# Patient Record
Sex: Male | Born: 1965 | Race: White | Hispanic: No | Marital: Married | Smoking: Former smoker
Health system: Southern US, Community
[De-identification: ages and names within clinical notes are randomized; demographics above are authoritative.]

## PROBLEM LIST (undated history)

## (undated) DIAGNOSIS — N189 Chronic kidney disease, unspecified: Secondary | ICD-10-CM

## (undated) DIAGNOSIS — I10 Essential (primary) hypertension: Secondary | ICD-10-CM

## (undated) DIAGNOSIS — C801 Malignant (primary) neoplasm, unspecified: Secondary | ICD-10-CM

## (undated) DIAGNOSIS — E119 Type 2 diabetes mellitus without complications: Secondary | ICD-10-CM

## (undated) DIAGNOSIS — L309 Dermatitis, unspecified: Secondary | ICD-10-CM

## (undated) HISTORY — DX: Dermatitis, unspecified: L30.9

## (undated) HISTORY — PX: SHOULDER SURGERY: SHX246

---

## 1996-09-07 HISTORY — PX: KNEE ARTHROSCOPY: SUR90

## 2001-06-13 ENCOUNTER — Other Ambulatory Visit: Admission: RE | Admit: 2001-06-13 | Discharge: 2001-06-13 | Payer: Self-pay | Admitting: Otolaryngology

## 2010-09-07 HISTORY — PX: COLONOSCOPY: SHX174

## 2010-09-25 ENCOUNTER — Ambulatory Visit
Admission: RE | Admit: 2010-09-25 | Discharge: 2010-09-25 | Payer: Self-pay | Source: Home / Self Care | Attending: Internal Medicine | Admitting: Internal Medicine

## 2010-11-05 ENCOUNTER — Encounter (HOSPITAL_BASED_OUTPATIENT_CLINIC_OR_DEPARTMENT_OTHER): Payer: 59 | Admitting: Internal Medicine

## 2010-11-05 ENCOUNTER — Ambulatory Visit (HOSPITAL_COMMUNITY)
Admission: RE | Admit: 2010-11-05 | Discharge: 2010-11-05 | Disposition: A | Discharge status: Home / Self Care | Payer: 59 | Source: Ambulatory Visit | Attending: Internal Medicine | Admitting: Internal Medicine

## 2010-11-05 ENCOUNTER — Other Ambulatory Visit (INDEPENDENT_AMBULATORY_CARE_PROVIDER_SITE_OTHER): Payer: Self-pay | Admitting: Internal Medicine

## 2010-11-05 DIAGNOSIS — I10 Essential (primary) hypertension: Secondary | ICD-10-CM | POA: Insufficient documentation

## 2010-11-05 DIAGNOSIS — R109 Unspecified abdominal pain: Secondary | ICD-10-CM

## 2010-11-05 DIAGNOSIS — R197 Diarrhea, unspecified: Secondary | ICD-10-CM

## 2010-11-05 DIAGNOSIS — Z79899 Other long term (current) drug therapy: Secondary | ICD-10-CM | POA: Insufficient documentation

## 2010-11-05 DIAGNOSIS — D126 Benign neoplasm of colon, unspecified: Secondary | ICD-10-CM | POA: Insufficient documentation

## 2010-11-05 DIAGNOSIS — Z7982 Long term (current) use of aspirin: Secondary | ICD-10-CM | POA: Insufficient documentation

## 2010-11-22 NOTE — Op Note (Signed)
  NAME:  [name], [name]                ACCOUNT NO.:  [id number]  MEDICAL RECORD NO.:  [id number]           PATIENT TYPE:  O  LOCATION:  [facility]YP                          FACILITY:  [facility]  PHYSICIAN:  Lionel December, M.D.    DATE OF BIRTH:  1966-05-04  DATE OF PROCEDURE:  11/05/2010 DATE OF DISCHARGE:                              OPERATIVE REPORT   PROCEDURE:  Colonoscopy with snare polypectomy.  INDICATION:  Douglas Robertson is a 45 year old Caucasian male who has been having chronic diarrhea and increased frequency of defecation.  Most of his stools are semi-formed and formed.  He has been treated antispasmodic without symptomatic relief.  He had stool studies, IBD panel, and celiac disease panel by Dr. Donzetta Sprung and all of these studies were negative.  His transaminases are mildly elevated, felt to be due to fatty liver.  He is undergoing diagnostic colonoscopy today.  Procedures were reviewed with the patient.  Informed consent was obtained. MEDS FOR CONSCIOUS SEDATION: 1. Demerol 50 mg IV. 2. Versed 6 mg IV.  FINDINGS:  Procedure performed in endoscopy suite.  The patient's vital signs and O2 sat were monitored during the procedure and remained stable.  The patient was placed in left lateral recumbent position. Rectal examination performed.  No abnormality noted on external or digital exam.  Pentax videoscope was placed through rectum and advanced under vision into sigmoid colon beyond.  Preparation was satisfactory. Scope was passed into cecum which was identified by appendiceal orifice and ileocecal valve.  Short segment of GI was also examined, it was normal.  As the scope was withdrawn, colonic mucosa was carefully examined.  There was a 6-7 mm sessile polyp at hepatic flexure which was snared and retrieved for sludge examination.  Rest of the mucosa was normal.  Random biopsies were taken from mucosa of sigmoid colon looking for microscopic colitis.  Rectal mucosa was normal.  Scope  was retroflexed to examine anorectal junction and small-to-moderate sized hemorrhoids were noted below the dentate line.  Endoscope was then withdrawn.  Withdrawal time was 11.5 minutes.  The patient tolerated the procedure well.  FINAL DIAGNOSES: 1. Normal terminal ileum. 2. No evidence of endoscopic colitis. 3. A 6-7 mm sessile polyp snared from hepatic flexure. 4. Random biopsies taken from mucosa of sigmoid colon, looking for     microscopic colitis.  RECOMMENDATIONS: 1. High-fiber diet. 2. I would like for him to increase his dicyclomine to 20 mg before     breakfast and 20 mg before lunch.  I will be contacting the patient     with results of biopsy and further recommendations.     Lionel December, M.D.     NR/MEDQ  D:  11/05/2010  T:  11/05/2010  Job:  [id number]  cc:   Donzetta Sprung Fax: [phone]  Electronically Signed by Lionel December M.D. on 11/22/2010 02:26:41 PM

## 2010-12-22 ENCOUNTER — Ambulatory Visit (INDEPENDENT_AMBULATORY_CARE_PROVIDER_SITE_OTHER): Payer: 59 | Admitting: Internal Medicine

## 2011-09-21 ENCOUNTER — Encounter (HOSPITAL_COMMUNITY): Payer: Self-pay | Admitting: *Deleted

## 2011-09-21 ENCOUNTER — Other Ambulatory Visit: Payer: Self-pay

## 2011-09-21 ENCOUNTER — Emergency Department (HOSPITAL_COMMUNITY)
Admission: EM | Admit: 2011-09-21 | Discharge: 2011-09-22 | Disposition: A | Discharge status: Home / Self Care | Payer: 59 | Attending: Emergency Medicine | Admitting: Emergency Medicine

## 2011-09-21 DIAGNOSIS — M25519 Pain in unspecified shoulder: Secondary | ICD-10-CM | POA: Insufficient documentation

## 2011-09-21 DIAGNOSIS — R6884 Jaw pain: Secondary | ICD-10-CM | POA: Insufficient documentation

## 2011-09-21 DIAGNOSIS — Z79899 Other long term (current) drug therapy: Secondary | ICD-10-CM | POA: Insufficient documentation

## 2011-09-21 DIAGNOSIS — I10 Essential (primary) hypertension: Secondary | ICD-10-CM | POA: Insufficient documentation

## 2011-09-21 DIAGNOSIS — Z7982 Long term (current) use of aspirin: Secondary | ICD-10-CM | POA: Insufficient documentation

## 2011-09-21 DIAGNOSIS — R079 Chest pain, unspecified: Secondary | ICD-10-CM | POA: Insufficient documentation

## 2011-09-21 HISTORY — DX: Essential (primary) hypertension: I10

## 2011-09-21 LAB — COMPREHENSIVE METABOLIC PANEL
ALT: 45 U/L (ref 0–53)
AST: 30 U/L (ref 0–37)
Albumin: 4 g/dL (ref 3.5–5.2)
Alkaline Phosphatase: 46 U/L (ref 39–117)
CO2: 26 mEq/L (ref 19–32)
Chloride: 99 mEq/L (ref 96–112)
Creatinine, Ser: 0.79 mg/dL (ref 0.50–1.35)
GFR calc non Af Amer: >90 mL/min (ref >90)
Potassium: 4 mEq/L (ref 3.5–5.1)
Total Bilirubin: 0.2 mg/dL — ABNORMAL LOW (ref 0.3–1.2)

## 2011-09-21 LAB — DIFFERENTIAL
Basophils Relative: 1 % (ref 0–1)
Eosinophils Absolute: 0.3 10*3/uL (ref 0.0–0.7)
Lymphs Abs: 2.1 10*3/uL (ref 0.7–4.0)
Monocytes Absolute: 0.7 10*3/uL (ref 0.1–1.0)
Monocytes Relative: 9 % (ref 3–12)
Neutro Abs: 4.7 10*3/uL (ref 1.7–7.7)

## 2011-09-21 LAB — CBC
HCT: 40.8 % (ref 39.0–52.0)
Hemoglobin: 14.3 g/dL (ref 13.0–17.0)
MCH: 28.3 pg (ref 26.0–34.0)
MCHC: 35 g/dL (ref 30.0–36.0)
RBC: 5.06 MIL/uL (ref 4.22–5.81)

## 2011-09-21 LAB — CK TOTAL AND CKMB (NOT AT ARMC): Relative Index: INVALID (ref 0.0–2.5)

## 2011-09-21 LAB — POCT I-STAT TROPONIN I

## 2011-09-21 NOTE — ED Notes (Deleted)
The pt says  He has had flu-like symptoms for several days.  He was here yesterday and was given a rx and he says the pharmacy would not fill because he has medicaid.  He is angry about the rx.

## 2011-09-21 NOTE — ED Notes (Signed)
The pt is c/o some lt sided chest pain and some lt shoulder radiation since Friday.  No sob no nausea

## 2011-09-22 MED ORDER — ASPIRIN 81 MG PO CHEW
324.0000 mg | CHEWABLE_TABLET | Freq: Once | ORAL | Status: AC
  Administered 2011-09-22: 324 mg via ORAL
  Filled 2011-09-22: qty 4

## 2011-09-22 MED ORDER — GI COCKTAIL ~~LOC~~
30.0000 mL | Freq: Once | ORAL | Status: AC
  Administered 2011-09-22: 30 mL via ORAL
  Filled 2011-09-22: qty 30

## 2011-09-22 NOTE — ED Notes (Signed)
Pt denies nausea, vomiting, SOB.  Family at bedside

## 2011-09-22 NOTE — ED Provider Notes (Signed)
History     CSN: [id number]  Arrival date & time 09/21/11  2224   First MD Initiated Contact with Patient 09/22/11 0048      Chief Complaint  Patient presents with  . Chest Pain    (Consider location/radiation/quality/duration/timing/severity/associated sxs/prior treatment) HPI Comments: States has been under a great deal of stress.  Has pcp  Patient is a 46 y.o. male presenting with chest pain. The history is provided by the patient. No language interpreter was used.  Chest Pain The chest pain began 3 - 5 days ago. Chest pain occurs constantly. The chest pain is unchanged. The pain is associated with stress. The severity of the pain is mild. The quality of the pain is described as aching. The pain radiates to the left jaw and left shoulder. Exacerbated by: nothing. Pertinent negatives for primary symptoms include no fever, no fatigue, no shortness of breath, no cough, no palpitations, no abdominal pain, no nausea, no vomiting and no dizziness.  Pertinent negatives for associated symptoms include no diaphoresis, no lower extremity edema, no near-syncope, no numbness and no weakness. He tried nothing for the symptoms. Risk factors include male gender and obesity.     Past Medical History  Diagnosis Date  . Hypertension     History reviewed. No pertinent past surgical history.  History reviewed. No pertinent family history.  History  Substance Use Topics  . Smoking status: Former Games developer  . Smokeless tobacco: Not on file  . Alcohol Use: No      Review of Systems  Constitutional: Negative for fever, diaphoresis, activity change, appetite change and fatigue.  HENT: Negative for congestion, sore throat, rhinorrhea, neck pain and neck stiffness.   Respiratory: Negative for cough and shortness of breath.   Cardiovascular: Positive for chest pain. Negative for palpitations and near-syncope.  Gastrointestinal: Negative for nausea, vomiting and abdominal pain.  Genitourinary:  Negative for dysuria, urgency, frequency and flank pain.  Neurological: Negative for dizziness, weakness, light-headedness, numbness and headaches.  All other systems reviewed and are negative.    Allergies  Review of patient's allergies indicates no known allergies.  Home Medications   Current Outpatient Rx  Name Route Sig Dispense Refill  . ASPIRIN EC 81 MG PO TBEC Oral Take 162 mg by mouth once. For chest pain    . NAPROXEN SODIUM 220 MG PO TABS Oral Take 440 mg by mouth once. For the chest pain    . OLMESARTAN MEDOXOMIL-HCTZ 40-12.5 MG PO TABS Oral Take 1 tablet by mouth daily.      BP 109/66  Pulse 60  Temp(Src) 97.1 F (36.2 C) (Oral)  Resp 16  SpO2 96%  Physical Exam  Nursing note and vitals reviewed. Constitutional: He is oriented to person, place, and time. He appears well-developed and well-nourished. No distress.  HENT:  Head: Normocephalic and atraumatic.  Mouth/Throat: Oropharynx is clear and moist.  Eyes: Conjunctivae and EOM are normal. Pupils are equal, round, and reactive to light.  Neck: Normal range of motion. Neck supple.  Cardiovascular: Normal rate, regular rhythm, normal heart sounds and intact distal pulses.  Exam reveals no gallop and no friction rub.   No murmur heard. Pulmonary/Chest: Effort normal and breath sounds normal. No respiratory distress. He exhibits no tenderness.  Abdominal: Soft. Bowel sounds are normal. There is no tenderness. There is no rebound and no guarding.  Musculoskeletal: Normal range of motion. He exhibits no tenderness.  Neurological: He is alert and oriented to person, place, and time. No cranial  nerve deficit.  Skin: Skin is warm and dry. No rash noted.    ED Course  Procedures (including critical care time)  Labs Reviewed  COMPREHENSIVE METABOLIC PANEL - Abnormal; Notable for the following:    Total Bilirubin 0.2 (*)    All other components within normal limits  CBC  DIFFERENTIAL  CK TOTAL AND CKMB  POCT I-STAT  TROPONIN I  TROPONIN I  I-STAT TROPONIN I   No results found.   1. Chest pain       MDM  Delta troponin is normal. Patient is pain-free at this time. I feel this is likely nonspecific and noncardiac chest pain. I feel this is likely stress related. I instructed him to followup with his primary care physician to discuss possibility of stress test. I have no concern for pulmonary embolus at this time. Additional life-threatening etiologies were considered but unlikely at this time. Provided signs and symptoms for which to return        Dayton Bailiff, MD 09/22/11 [phone]

## 2011-09-22 NOTE — ED Notes (Signed)
Pt presents with c/o CP onset 4 days ago waxing and waning. Currently states 2/10 left Cp to left shoulder

## 2011-10-08 DIAGNOSIS — R079 Chest pain, unspecified: Secondary | ICD-10-CM

## 2013-04-06 DIAGNOSIS — I10 Essential (primary) hypertension: Secondary | ICD-10-CM | POA: Insufficient documentation

## 2013-04-06 DIAGNOSIS — E782 Mixed hyperlipidemia: Secondary | ICD-10-CM | POA: Insufficient documentation

## 2013-04-11 ENCOUNTER — Ambulatory Visit (INDEPENDENT_AMBULATORY_CARE_PROVIDER_SITE_OTHER): Payer: 59 | Admitting: Orthopedic Surgery

## 2013-04-11 ENCOUNTER — Ambulatory Visit (INDEPENDENT_AMBULATORY_CARE_PROVIDER_SITE_OTHER): Payer: 59

## 2013-04-11 VITALS — BP 124/78 | Ht 68.0 in | Wt 239.0 lb

## 2013-04-11 DIAGNOSIS — M25512 Pain in left shoulder: Secondary | ICD-10-CM

## 2013-04-11 DIAGNOSIS — M25519 Pain in unspecified shoulder: Secondary | ICD-10-CM

## 2013-04-11 DIAGNOSIS — M719 Bursopathy, unspecified: Secondary | ICD-10-CM

## 2013-04-11 DIAGNOSIS — M67919 Unspecified disorder of synovium and tendon, unspecified shoulder: Secondary | ICD-10-CM

## 2013-04-11 NOTE — Patient Instructions (Addendum)
You have received a steroid shot. 15% of patients experience increased pain at the injection site with in the next 24 hours. This is best treated with ice and tylenol extra strength 2 tabs every 8 hours. If you are still having pain please call the office.   Start nsaids and and pain reliever at night  Impingement Syndrome, Rotator Cuff, Bursitis with Rehab Impingement syndrome is a condition that involves inflammation of the tendons of the rotator cuff and the subacromial bursa, that causes pain in the shoulder. The rotator cuff consists of four tendons and muscles that control much of the shoulder and upper arm function. The subacromial bursa is a fluid filled sac that helps reduce friction between the rotator cuff and one of the bones of the shoulder (acromion). Impingement syndrome is usually an overuse injury that causes swelling of the bursa (bursitis), swelling of the tendon (tendonitis), and/or a tear of the tendon (strain). Strains are classified into three categories. Grade 1 strains cause pain, but the tendon is not lengthened. Grade 2 strains include a lengthened ligament, due to the ligament being stretched or partially ruptured. With grade 2 strains there is still function, although the function may be decreased. Grade 3 strains include a complete tear of the tendon or muscle, and function is usually impaired. SYMPTOMS   Pain around the shoulder, often at the outer portion of the upper arm.  Pain that gets worse with shoulder function, especially when reaching overhead or lifting.  Sometimes, aching when not using the arm.  Pain that wakes you up at night.  Sometimes, tenderness, swelling, warmth, or redness over the affected area.  Loss of strength.  Limited motion of the shoulder, especially reaching behind the back (to the back pocket or to unhook bra) or across your body.  Crackling sound (crepitation) when moving the arm.  Biceps tendon pain and inflammation (in the front  of the shoulder). Worse when bending the elbow or lifting. CAUSES  Impingement syndrome is often an overuse injury, in which chronic (repetitive) motions cause the tendons or bursa to become inflamed. A strain occurs when a force is paced on the tendon or muscle that is greater than it can withstand. Common mechanisms of injury include: Stress from sudden increase in duration, frequency, or intensity of training.  Direct hit (trauma) to the shoulder.  Aging, erosion of the tendon with normal use.  Bony bump on shoulder (acromial spur). RISK INCREASES WITH:  Contact sports (football, wrestling, boxing).  Throwing sports (baseball, tennis, volleyball).  Weightlifting and bodybuilding.  Heavy labor.  Previous injury to the rotator cuff, including impingement.  Poor shoulder strength and flexibility.  Failure to warm up properly before activity.  Inadequate protective equipment.  Old age.  Bony bump on shoulder (acromial spur). PREVENTION   Warm up and stretch properly before activity.  Allow for adequate recovery between workouts.  Maintain physical fitness:  Strength, flexibility, and endurance.  Cardiovascular fitness.  Learn and use proper exercise technique. PROGNOSIS  If treated properly, impingement syndrome usually goes away within 6 weeks. Sometimes surgery is required.  RELATED COMPLICATIONS   Longer healing time if not properly treated, or if not given enough time to heal.  Recurring symptoms, that result in a chronic condition.  Shoulder stiffness, frozen shoulder, or loss of motion.  Rotator cuff tendon tear.  Recurring symptoms, especially if activity is resumed too soon, with overuse, with a direct blow, or when using poor technique. TREATMENT  Treatment first involves the use  of ice and medicine, to reduce pain and inflammation. The use of strengthening and stretching exercises may help reduce pain with activity. These exercises may be performed  at home or with a therapist. If non-surgical treatment is unsuccessful after more than 6 months, surgery may be advised. After surgery and rehabilitation, activity is usually possible in 3 months.  MEDICATION  If pain medicine is needed, nonsteroidal anti-inflammatory medicines (aspirin and ibuprofen), or other minor pain relievers (acetaminophen), are often advised.  Do not take pain medicine for 7 days before surgery.  Prescription pain relievers may be given, if your caregiver thinks they are needed. Use only as directed and only as much as you need.  Corticosteroid injections may be given by your caregiver. These injections should be reserved for the most serious cases, because they may only be given a certain number of times. HEAT AND COLD  Cold treatment (icing) should be applied for 10 to 15 minutes every 2 to 3 hours for inflammation and pain, and immediately after activity that aggravates your symptoms. Use ice packs or an ice massage.  Heat treatment may be used before performing stretching and strengthening activities prescribed by your caregiver, physical therapist, or athletic trainer. Use a heat pack or a warm water soak. SEEK MEDICAL CARE IF:   Symptoms get worse or do not improve in 4 to 6 weeks, despite treatment.  New, unexplained symptoms develop. (Drugs used in treatment may produce side effects.)

## 2013-04-12 ENCOUNTER — Encounter: Payer: Self-pay | Admitting: Orthopedic Surgery

## 2013-04-12 DIAGNOSIS — M67919 Unspecified disorder of synovium and tendon, unspecified shoulder: Secondary | ICD-10-CM | POA: Insufficient documentation

## 2013-04-12 DIAGNOSIS — M719 Bursopathy, unspecified: Secondary | ICD-10-CM | POA: Insufficient documentation

## 2013-04-12 DIAGNOSIS — M25512 Pain in left shoulder: Secondary | ICD-10-CM | POA: Insufficient documentation

## 2013-04-12 NOTE — Progress Notes (Signed)
  Subjective:    Patient ID: Douglas Robertson., male    DOB: [birth year], 47 y.o.   MRN: [id number]  Shoulder Pain  The pain is present in the left shoulder. This is a new problem. The current episode started more than 1 month ago. There has been no history of extremity trauma. The problem occurs intermittently. The problem has been unchanged. The quality of the pain is described as aching and dull. The pain is mild. Pertinent negatives include no fever, joint locking, joint swelling, limited range of motion, numbness, stiffness or tingling.      Review of Systems  Constitutional: Negative for fever.  Musculoskeletal: Negative for stiffness.  Neurological: Negative for tingling and numbness.       Objective:   Physical Exam  Constitutional: He is oriented to person, place, and time. He appears well-developed and well-nourished.  Neck: Neck supple.  Cardiovascular: Intact distal pulses.   Lymphadenopathy:    He has no cervical adenopathy.  Neurological: He is alert and oriented to person, place, and time. He has normal reflexes.  Skin: Skin is warm and dry.  Psychiatric: He has a normal mood and affect. His behavior is normal. Thought content normal.  Right Shoulder Exam  Right shoulder exam is normal.  Tenderness  The patient is experiencing no tenderness.    Range of Motion  The patient has normal right shoulder ROM.  Muscle Strength  The patient has normal right shoulder strength.  Tests  Apprehension: negative Impingement: negative  Other  Erythema: absent Sensation: normal Pulse: present   Left Shoulder Exam   Tenderness  Left shoulder tenderness location:  rotator interval.  Range of Motion  The patient has normal left shoulder ROM.  Muscle Strength  The patient has normal left shoulder strength.  Tests  Apprehension: negative Cross Arm: negative Drop Arm: negative Hawkin's test: positive Impingement: negative Sulcus: absent  Other  Erythema:  absent Scars: absent Sensation: normal Pulse: present        X-rays are unremarkable no major abnormalities     Assessment & Plan:  Impingement syndrome possible small rotator cuff tear  Recommend subacromial injection therapy medication and see what happens if no improvement we can do an MRI in a few weeks

## 2013-04-13 MED ORDER — HYDROCODONE-ACETAMINOPHEN 5-325 MG PO TABS: 1.0000 | ORAL_TABLET | Freq: Four times a day (QID) | ORAL | Status: DC | PRN

## 2013-04-13 MED ORDER — DICLOFENAC POTASSIUM 50 MG PO TABS: 50.0000 mg | ORAL_TABLET | Freq: Two times a day (BID) | ORAL | Status: DC

## 2013-09-18 DIAGNOSIS — N1831 Chronic kidney disease, stage 3a: Secondary | ICD-10-CM | POA: Insufficient documentation

## 2013-09-18 DIAGNOSIS — IMO0002 Reserved for concepts with insufficient information to code with codable children: Secondary | ICD-10-CM | POA: Insufficient documentation

## 2013-10-31 ENCOUNTER — Ambulatory Visit: Payer: 59 | Admitting: Orthopedic Surgery

## 2013-11-07 ENCOUNTER — Ambulatory Visit (INDEPENDENT_AMBULATORY_CARE_PROVIDER_SITE_OTHER): Payer: 59

## 2013-11-07 ENCOUNTER — Ambulatory Visit (INDEPENDENT_AMBULATORY_CARE_PROVIDER_SITE_OTHER): Payer: 59 | Admitting: Orthopedic Surgery

## 2013-11-07 VITALS — BP 110/62 | Ht 67.0 in | Wt 235.0 lb

## 2013-11-07 DIAGNOSIS — M25512 Pain in left shoulder: Secondary | ICD-10-CM

## 2013-11-07 DIAGNOSIS — M25511 Pain in right shoulder: Secondary | ICD-10-CM

## 2013-11-07 DIAGNOSIS — M719 Bursopathy, unspecified: Secondary | ICD-10-CM

## 2013-11-07 DIAGNOSIS — M67919 Unspecified disorder of synovium and tendon, unspecified shoulder: Secondary | ICD-10-CM

## 2013-11-07 DIAGNOSIS — M25519 Pain in unspecified shoulder: Secondary | ICD-10-CM

## 2013-11-07 MED ORDER — HYDROCODONE-ACETAMINOPHEN 5-325 MG PO TABS: 1.0000 | ORAL_TABLET | Freq: Four times a day (QID) | ORAL | Status: DC | PRN

## 2013-11-07 NOTE — Patient Instructions (Signed)
You have received a steroid shot. 15% of patients experience increased pain at the injection site with in the next 24 hours. This is best treated with ice and tylenol extra strength 2 tabs every 8 hours. If you are still having pain please call the office.    

## 2013-11-08 ENCOUNTER — Encounter: Payer: Self-pay | Admitting: Orthopedic Surgery

## 2013-11-08 NOTE — Progress Notes (Signed)
Patient ID: Douglas Ann., male   DOB: Aug 04, 1966, 48 y.o.   MRN: [id number]  Chief Complaint  Patient presents with  . Shoulder Pain    recheck left shoulder and new problem right shoulder pain    The patient returns to reevaluate his left shoulder after injection. X-rays did not show any fracture or glenohumeral arthritis he was treated for bursitis. He has improved but would like another injection in his left shoulder. He now presents with new onset pain in the right shoulder similar to the left. He has painful for elevation and pain with overhead activity. He denies any weakness, denies any trauma. Pain appears to be over the right deltoid anterior deltoid and lateral deltoid.  Review of systems is negative including no evidence of numbness tingling or cervical spine pain or loss of motion.  Vital signs: BP 110/62  Ht 5\' 7"  (1.702 m)  Wt 235 lb (106.595 kg)  BMI 36.80 kg/m2   General the patient is well-developed and well-nourished grooming and hygiene are normal Oriented x3 Mood and affect normal Ambulation normal  Inspection of the left shoulder reveals full forward elevation mild impingement sign 160 of forward elevation. Rotator cuff remains intact and the neurovascular exam is normal  Right shoulder has remained painful range of motion with painful for elevation from 100 280 and has a positive impingement sign and 150 Full range of motion All joints are stable Motor exam is normal Skin clean dry and intact  Cardiovascular exam is normal Sensory exam normal  Right shoulder imaging shows greater tuberosity sclerosis otherwise normal  Impression bilateral rotator cuff syndrome  Recommend right shoulder injection left shoulder injection and a mild pain reliever  Procedure inject subacromial space right shoulder Diagnosis rotator cuff syndrome right shoulder Medication Depo-Medrol 40 mg, 1 cc and lidocaine 1% 3 cc Verbal consent Timeout completed  The  injection site was cleaned with alcohol and sprayed with ethyl chloride. From a posterior approach a 20-gauge needle was injected in the subacromial space. The medication went in easily. There were no complications. The wound was covered with a sterile bandage. Appropriate precautions were given.  Procedure inject subacromial space left shoulder Diagnosis rotator cuff syndrome left shoulder Medication Depo-Medrol 40 mg, 1 cc and lidocaine 1% 3 cc Verbal consent Timeout completed  The injection site was cleaned with alcohol and sprayed with ethyl chloride. From a posterior approach a 20-gauge needle was injected in the subacromial space. The medication went in easily. There were no complications. The wound was covered with a sterile bandage. Appropriate precautions were given.

## 2014-02-07 DIAGNOSIS — L259 Unspecified contact dermatitis, unspecified cause: Secondary | ICD-10-CM | POA: Insufficient documentation

## 2014-02-13 ENCOUNTER — Encounter: Payer: Self-pay | Admitting: Podiatry

## 2014-02-13 ENCOUNTER — Ambulatory Visit (INDEPENDENT_AMBULATORY_CARE_PROVIDER_SITE_OTHER): Payer: 59

## 2014-02-13 ENCOUNTER — Ambulatory Visit (INDEPENDENT_AMBULATORY_CARE_PROVIDER_SITE_OTHER): Payer: 59 | Admitting: Podiatry

## 2014-02-13 VITALS — Ht 67.0 in | Wt 235.0 lb

## 2014-02-13 DIAGNOSIS — M722 Plantar fascial fibromatosis: Secondary | ICD-10-CM

## 2014-02-13 MED ORDER — METHYLPREDNISOLONE (PAK) 4 MG PO TABS: ORAL_TABLET | ORAL | Status: DC

## 2014-02-13 MED ORDER — MELOXICAM 15 MG PO TABS: 15.0000 mg | ORAL_TABLET | Freq: Every day | ORAL | Status: DC

## 2014-02-13 NOTE — Patient Instructions (Signed)

## 2014-02-13 NOTE — Progress Notes (Signed)
   Subjective:    Patient ID: Douglas Ann., male    DOB: 26-Jun-1966, 48 y.o.   MRN: [id number]  HPI Comments: "My foot hurts"  Patient c/o aching plantar heel left for about 2 months. He has AM pain. The pain sometimes is constant throughout the day. He stands at work all day. He has tried Advil or Aleve with no relief.  Foot Pain      Review of Systems  All other systems reviewed and are negative.      Objective:   Physical Exam: I have reviewed his past mental history medications allergies surgeries social history and review of systems. Pulses are strongly palpable bilateral. Neurologic sensorium is intact bilateral. Muscle strength is 5 over 5 dorsiflexors plantar flexors inverters everters all intrinsic musculature appears to be intact. Orthopedic evaluation demonstrates pain on palpation medial calcaneal tubercle of the left heel. Radiographic evaluation demonstrates soft tissue increase in density at the plantar fascial calcaneal insertion site with a plantar distally oriented calcaneal heel spur. No other osseous abnormalities were noted.        Assessment & Plan:  Assessment: Plantar fasciitis of the left heel.  Plan: Dispensed a prescription for Medrol Dosepak to be followed by Monday. He was given both oral and written home-going instructions for diagnosis and therapy. We injected the left heel today with Kenalog and local anesthetic he was dispensed a night splint as well as a plantar fascial brace. We discussed appropriate shoe gear stretching exercises ice therapy and shoe gear modifications. I will followup with him in one month.

## 2014-03-13 ENCOUNTER — Ambulatory Visit: Payer: 59 | Admitting: Podiatry

## 2014-03-20 ENCOUNTER — Ambulatory Visit (INDEPENDENT_AMBULATORY_CARE_PROVIDER_SITE_OTHER): Payer: 59 | Admitting: Podiatry

## 2014-03-20 ENCOUNTER — Encounter: Payer: Self-pay | Admitting: Podiatry

## 2014-03-20 DIAGNOSIS — M722 Plantar fascial fibromatosis: Secondary | ICD-10-CM

## 2014-03-20 MED ORDER — DICLOFENAC SODIUM 75 MG PO TBEC: 75.0000 mg | DELAYED_RELEASE_TABLET | Freq: Two times a day (BID) | ORAL | Status: DC

## 2014-03-21 NOTE — Progress Notes (Signed)
He presents today stating that his left heel really hasn't had any better at all may be slightly better after taking the Medrol but still not very good.  Objective: Vital signs are stable he is alert and oriented x3. He still has pain on palpation medial continued tubercle.  Assessment: Plantar fasciitis left.  Plan: I reinjected his left heel today however I used Medrol at this point and changed his nonsteroidal anti-inflammatory to diclofenac I will followup with him in one month

## 2014-04-03 ENCOUNTER — Ambulatory Visit (INDEPENDENT_AMBULATORY_CARE_PROVIDER_SITE_OTHER): Payer: 59 | Admitting: Podiatry

## 2014-04-03 ENCOUNTER — Encounter: Payer: Self-pay | Admitting: Podiatry

## 2014-04-03 VITALS — BP 116/82 | HR 88 | Resp 16

## 2014-04-03 DIAGNOSIS — G5790 Unspecified mononeuropathy of unspecified lower limb: Secondary | ICD-10-CM

## 2014-04-03 DIAGNOSIS — G5792 Unspecified mononeuropathy of left lower limb: Secondary | ICD-10-CM

## 2014-04-03 DIAGNOSIS — M722 Plantar fascial fibromatosis: Secondary | ICD-10-CM

## 2014-04-03 NOTE — Progress Notes (Signed)
He presents today for followup of his plantar fasciitis his left foot. He states that he is not getting any better as a matter fact maybe worse he says he states that the anti-inflammatory that he is taken discussing to be working. He does continue to wear the night splint at night.  Objective: Vital signs are stable he is alert and oriented x3. Moderate to severe pain on palpation medial calcaneal tubercle with radiating pain proximal along the medial side of the foot.  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 Also suggest that he discontinued his anti-inflammatory. I did suggest that he continue his night splint however I will followup with him in 3 weeks for his second injection. He was also scanned for a pair orthotics today.

## 2014-04-05 ENCOUNTER — Telehealth: Payer: Self-pay | Admitting: *Deleted

## 2014-04-05 NOTE — Telephone Encounter (Signed)
We started him on alcohol injections to the heel.  This was the first one of this type.  He is to follow up with me in three weeks.  He cannot take an antiinflammatory at this point due to the injection.  Yes the lateral pain in the toes will go away once the heel pain is resolved and he stops compensating.  You may give him tramadol 50mg  #50 if you wish.  One to two by mouth every six to eight hours as needed for pain.

## 2014-04-05 NOTE — Telephone Encounter (Signed)
I returned his call.  When I come in there the other day I talked to him about where I was over compensating.  My last 3 toes on my left foot are killing me I can't walk.  I didn't go to work today because it hurts me so bad.  Do I need to get pain medicine?  Is this here going to go away?  He gave me another shot in it, that was the third one.  I told him I would contact Dr. Milinda Pointer and see what he recommends.

## 2014-04-05 NOTE — Telephone Encounter (Signed)
Calling concerning my left foot about my Plantar Fasciitis.  Please call me ASAP.  Thank You

## 2014-04-06 MED ORDER — TRAMADOL HCL 50 MG PO TABS: 50.0000 mg | ORAL_TABLET | Freq: Four times a day (QID) | ORAL | Status: DC | PRN

## 2014-04-06 NOTE — Telephone Encounter (Signed)
I called and informed him that Dr. Milinda Pointer said you cannot take an anti-inflammatory at this point due to the injection.  The pain in the toes will go away once the heel pain is resolved and you stop compensating.  I told him he wants to see him back in 3 weeks.  He stated so I have to deal with this pain for 3 weeks, I have to work.  The pain can be unbearable.  I told him maybe Dr. Milinda Pointer will okay some time off from work.  He stated I'll see how it goes and if I can't tolerate it anymore I will call.  He stated he's taking extra strength Tylenol right now.  I told him Dr. Milinda Pointer said he can have a prescription for Tramadol.  He asked me to call it into the pharmacy.  I told him it can't be called in, you will have to come by the office to pick it up because it's a controlled substance.  I told him he will have to show his identification to pick it up as well as sign for the prescription.  He stated he would come by on Monday to pick it up.

## 2014-04-17 ENCOUNTER — Ambulatory Visit: Payer: 59 | Admitting: Podiatry

## 2014-04-18 ENCOUNTER — Encounter: Payer: Self-pay | Admitting: Podiatry

## 2014-04-18 ENCOUNTER — Ambulatory Visit (INDEPENDENT_AMBULATORY_CARE_PROVIDER_SITE_OTHER): Payer: 59

## 2014-04-18 ENCOUNTER — Ambulatory Visit (INDEPENDENT_AMBULATORY_CARE_PROVIDER_SITE_OTHER): Payer: 59 | Admitting: Podiatry

## 2014-04-18 VITALS — Resp 17 | Ht 67.0 in | Wt 230.0 lb

## 2014-04-18 DIAGNOSIS — M79672 Pain in left foot: Secondary | ICD-10-CM

## 2014-04-18 DIAGNOSIS — S92309A Fracture of unspecified metatarsal bone(s), unspecified foot, initial encounter for closed fracture: Secondary | ICD-10-CM

## 2014-04-18 DIAGNOSIS — M79609 Pain in unspecified limb: Secondary | ICD-10-CM

## 2014-04-18 MED ORDER — HYDROCODONE-ACETAMINOPHEN 5-300 MG PO TABS: ORAL_TABLET | ORAL | Status: DC

## 2014-04-18 NOTE — Progress Notes (Signed)
   Subjective:    Patient ID: Douglas Robertson., male    DOB: 1965-11-01, 48 y.o.   MRN: [id number]  HPI Comments: Patient presents with complaints of pain over the 3rd-5th "toes", which has increased since his last appointment. He believes that he has increased swelling over the area and pain with weightbearing. He denies any trauma to the area however he does state that he walks on the outside of his foot due to the heel pain. No other complaints at this time.      Foot Pain      Review of Systems  Musculoskeletal:       L foot pain  All other systems reviewed and are negative.      Objective:   Physical Exam  Nursing note and vitals reviewed. Constitutional: He is oriented to person, place, and time. He appears well-developed and well-nourished.  Musculoskeletal:  Tenderness to palpation over the distal aspect of the 4th metatarsal. Pain with tuning fork. Mild discomfort with ROM of lateral digits. No gross deformity.   Neurological: He is alert and oriented to person, place, and time.  Protective sensation intact.   Skin:  No open lesions.    DP/PT palpable, CRT < 3 sec.        Assessment & Plan:  48 year old male L foot stress fracture likely secondary to compensation -X-Rays obtained which revealed likely stress fracture to the 4th metatarsal, in good alinement.  -CAM boot -Will hold off on another dehydrated alcohol injection for now -f/u with Dr. Milinda Pointer for regularly scheduled appointment.

## 2014-04-18 NOTE — Patient Instructions (Signed)
Stress Fracture When too much stress is put on the foot, as may occur in running and jumping sports, the lengthy shafts of the bones of the forefoot become susceptible to breaking due to repetitive stress (stress fracture) because of thinness of these bone. A stress fracture is more common if osteoporosis is present or if inadequate athletic footwear is used. Shoes should be used which adequately support the sole of the foot to absorb the shocks of the activity participated in. Stress fractures are very common in competitive male runners who develop these small cracks on the surface of the bones in their legs and feet. The women most likely to suffer these injuries are those who restrict food intake and those who have irregular periods. Stress fractures usually start out as a minor discomfort in the foot or leg. The completion of fracture due to repetitive loading often occurs near the end of a long run. The pain may dissipate with rest. With the next exercise session, the pain may return earlier in the run. If an athlete notices that it hurts to touch just one spot on a bone and then stops running for a week, he or she may be tempted to return to running too soon. Often the pain is ignored in order to continue with high impact exercise. A stress fracture then develops. The athlete now has to avoid the hard pounding of running, but can ride a bike or swim for exercise once the pain has resolved with normal weight bearing until the fracture heals in 6-12 weeks. The most common sites for stress fractures are the bones in the front of the feet (metatarsals) and the long bone of the lower leg (tibia), but running can cause stress fractures anywhere in the lower extremities or pelvis. DIAGNOSIS  Usually the diagnosis is made by reviewing the patient's history. The bone involved progressively becomes more painful with activities. X-rays may show no break within the first 2-3 weeks that pain begins. A later X-ray may  show signs that the bone is healing. Having a bone scan or MRI usually makes an earlier diagnosis possible. HOME CARE INSTRUCTIONS  Treatment may include a cast or walking shoe.  High impact activities should be stopped until advised by your caregiver.  Wear shoes with adequate shock absorbing abilities and good support of the sole of the foot. This is especially important in the arch of the foot.  Alternative exercise may be undertaken while waiting for healing. This may include bicycling and swimming. If you do not have a cast or splint:  You may walk on your injured foot as tolerated or advised.  Do not put any weight on your injured foot until instructed. Slowly increase the amount of time you walk on the foot as the pain allows or as advised.  Use crutches until you can bear weight without pain. A gradual increase in weight bearing may help.  Apply ice to the injured area for the first 2 days after you have been treated or as directed by your caregiver.  Put ice in a plastic bag.  Place a towel between your skin and the bag.  Leave the ice on for 15-20 minutes at a time, every hour while you are awake.  Only take over-the-counter or prescription medicines for pain or discomfort as directed by your caregiver.  If your caregiver has given you a follow-up appointment, it is very important to keep that appointment. Not keeping the appointment could result in a chronic or permanent   injury, pain, and disability. SEEK IMMEDIATE MEDICAL CARE IF:   Pain is becoming worse rather than better.  Pain is uncontrolled with medicine.  You have increased swelling or redness in the foot.  The feeling in the foot or leg is diminished. MAKE SURE YOU:   Understand these instructions.  Will watch your condition.  Will get help right away if you are not doing well or get worse. Document Released: 11/14/2002 Document Revised: 12/19/2012 Document Reviewed: 04/09/2008 Exit[employer] Patient  Information 2015 [employer]. This information is not intended to replace advice given to you by your health care provider. Make sure you discuss any questions you have with your health care provider.  

## 2014-04-19 ENCOUNTER — Ambulatory Visit (INDEPENDENT_AMBULATORY_CARE_PROVIDER_SITE_OTHER): Payer: 59 | Admitting: Orthopedic Surgery

## 2014-04-19 ENCOUNTER — Telehealth: Payer: Self-pay | Admitting: *Deleted

## 2014-04-19 VITALS — BP 105/63 | Ht 67.0 in | Wt 230.0 lb

## 2014-04-19 DIAGNOSIS — M25512 Pain in left shoulder: Secondary | ICD-10-CM

## 2014-04-19 DIAGNOSIS — M25519 Pain in unspecified shoulder: Secondary | ICD-10-CM

## 2014-04-19 DIAGNOSIS — M25511 Pain in right shoulder: Secondary | ICD-10-CM

## 2014-04-19 DIAGNOSIS — M67919 Unspecified disorder of synovium and tendon, unspecified shoulder: Secondary | ICD-10-CM

## 2014-04-19 DIAGNOSIS — M719 Bursopathy, unspecified: Principal | ICD-10-CM

## 2014-04-19 NOTE — Telephone Encounter (Signed)
I was in there yesterday and he put me in a boot.  How many hours a day do I have to wear that thing?  I told him he needs to wear it all day.  He stated, so I need to wear it up until I go to bed.  I told him yes.

## 2014-04-19 NOTE — Progress Notes (Signed)
Chief Complaint  Patient presents with  . Shoulder Pain    bilateral shoulder pain, requests injections    BP 105/63  Ht 5\' 7"  (1.702 m)  Wt 230 lb (104.327 kg)  BMI 36.01 kg/m2  Bilateral shoulder pain with bilateral shoulder injections no change in overall symptoms injection seemed to last for about 6 months  Procedure inject subacromial space left shoulder Diagnosis rotator cuff syndrome left shoulder Medication Depo-Medrol 40 mg, 1 cc and lidocaine 1% 3 cc Verbal consent Timeout completed  The injection site was cleaned with alcohol and sprayed with ethyl chloride. From a posterior approach a 20-gauge needle was injected in the subacromial space. The medication went in easily. There were no complications. The wound was covered with a sterile bandage. Appropriate precautions were given. Procedure inject subacromial space right shoulder Diagnosis rotator cuff syndrome right shoulder Medication Depo-Medrol 40 mg, 1 cc and lidocaine 1% 3 cc Verbal consent Timeout completed  The injection site was cleaned with alcohol and sprayed with ethyl chloride. From a posterior approach a 20-gauge needle was injected in the subacromial space. The medication went in easily. There were no complications. The wound was covered with a sterile bandage. Appropriate precautions were given.

## 2014-04-19 NOTE — Patient Instructions (Signed)
Joint Injection  Care After  Refer to this sheet in the next few days. These instructions provide you with information on caring for yourself after you have had a joint injection. Your caregiver also may give you more specific instructions. Your treatment has been planned according to current medical practices, but problems sometimes occur. Call your caregiver if you have any problems or questions after your procedure.  After any type of joint injection, it is not uncommon to experience:  · Soreness, swelling, or bruising around the injection site.  · Mild numbness, tingling, or weakness around the injection site caused by the numbing medicine used before or with the injection.  It also is possible to experience the following effects associated with the specific agent after injection:  · Iodine-based contrast agents:  ¨ Allergic reaction (itching, hives, widespread redness, and swelling beyond the injection site).  · Corticosteroids (These effects are rare.):  ¨ Allergic reaction.  ¨ Increased blood sugar levels (If you have diabetes and you notice that your blood sugar levels have increased, notify your caregiver).  ¨ Increased blood pressure levels.  ¨ Mood swings.  · Hyaluronic acid in the use of viscosupplementation.  ¨ Temporary heat or redness.  ¨ Temporary rash and itching.  ¨ Increased fluid accumulation in the injected joint.  These effects all should resolve within a day after your procedure.   HOME CARE INSTRUCTIONS  · Limit yourself to light activity the day of your procedure. Avoid lifting heavy objects, bending, stooping, or twisting.  · Take prescription or over-the-counter pain medication as directed by your caregiver.  · You may apply ice to your injection site to reduce pain and swelling the day of your procedure. Ice may be applied 03-04 times:  ¨ Put ice in a plastic bag.  ¨ Place a towel between your skin and the bag.  ¨ Leave the ice on for no longer than 15-20 minutes each time.  SEEK  IMMEDIATE MEDICAL CARE IF:   · Pain and swelling get worse rather than better or extend beyond the injection site.  · Numbness does not go away.  · Blood or fluid continues to leak from the injection site.  · You have chest pain.  · You have swelling of your face or tongue.  · You have trouble breathing or you become dizzy.  · You develop a fever, chills, or severe tenderness at the injection site that last longer than 1 day.  MAKE SURE YOU:  · Understand these instructions.  · Watch your condition.  · Get help right away if you are not doing well or if you get worse.  Document Released: 05/07/2011 Document Revised: 11/16/2011 Document Reviewed: 05/07/2011  ExitCare® Patient Information ©2015 [employer]. This information is not intended to replace advice given to you by your health care provider. Make sure you discuss any questions you have with your health care provider.

## 2014-04-20 ENCOUNTER — Telehealth: Payer: Self-pay | Admitting: *Deleted

## 2014-04-20 NOTE — Telephone Encounter (Signed)
I have one of these boots that I'm wearing.  How long do I have to wear this boot in a day?  When can I take it off?  I'm just curious.  Thank You!

## 2014-05-01 ENCOUNTER — Ambulatory Visit (INDEPENDENT_AMBULATORY_CARE_PROVIDER_SITE_OTHER): Payer: 59 | Admitting: Podiatry

## 2014-05-01 ENCOUNTER — Ambulatory Visit: Payer: 59 | Admitting: Podiatry

## 2014-05-01 ENCOUNTER — Encounter: Payer: Self-pay | Admitting: Podiatry

## 2014-05-01 VITALS — BP 126/68 | HR 72 | Resp 16

## 2014-05-01 DIAGNOSIS — M79609 Pain in unspecified limb: Secondary | ICD-10-CM

## 2014-05-01 DIAGNOSIS — G5790 Unspecified mononeuropathy of unspecified lower limb: Secondary | ICD-10-CM

## 2014-05-01 DIAGNOSIS — M79672 Pain in left foot: Secondary | ICD-10-CM

## 2014-05-01 DIAGNOSIS — G5792 Unspecified mononeuropathy of left lower limb: Secondary | ICD-10-CM

## 2014-05-01 DIAGNOSIS — M722 Plantar fascial fibromatosis: Secondary | ICD-10-CM

## 2014-05-01 DIAGNOSIS — S92309A Fracture of unspecified metatarsal bone(s), unspecified foot, initial encounter for closed fracture: Secondary | ICD-10-CM

## 2014-05-01 NOTE — Progress Notes (Signed)
He presents today for followup of his plantar fasciitis. Last time he was here we injected him with his first dose of dehydrated alcohol. Subsequently he has recently been diagnosed with a stress fracture fourth metatarsal base left foot. He presents today with his Cam Walker.  Objective: Vital signs are stable he is alert and oriented x3. Pulses are strongly palpable bilateral. He has pain on palpation medial calcaneal tubercle of the left heel. He has tenderness with swelling overlying the mid diaphyseal region of the fourth metatarsal left.  Assessment: Plantar fasciitis with neuritis left heel. Stress fracture fourth metatarsal left.  Plan: Reinjected his second dose of dehydrated alcohol to Baxter's neuroma/neuritis/plantar fasciitis left foot. Followup with him in 2 weeks for a rex-ray. At which time we will probably inject another dehydrated alcohol.

## 2014-05-09 DIAGNOSIS — G5790 Unspecified mononeuropathy of unspecified lower limb: Secondary | ICD-10-CM

## 2014-05-15 ENCOUNTER — Encounter: Payer: Self-pay | Admitting: Podiatry

## 2014-05-15 ENCOUNTER — Ambulatory Visit (INDEPENDENT_AMBULATORY_CARE_PROVIDER_SITE_OTHER): Payer: 59 | Admitting: Podiatry

## 2014-05-15 ENCOUNTER — Ambulatory Visit (INDEPENDENT_AMBULATORY_CARE_PROVIDER_SITE_OTHER): Payer: 59

## 2014-05-15 VITALS — BP 119/72 | HR 68 | Resp 16

## 2014-05-15 DIAGNOSIS — G5790 Unspecified mononeuropathy of unspecified lower limb: Secondary | ICD-10-CM

## 2014-05-15 DIAGNOSIS — M84375D Stress fracture, left foot, subsequent encounter for fracture with routine healing: Secondary | ICD-10-CM

## 2014-05-15 DIAGNOSIS — M8448XD Pathological fracture, other site, subsequent encounter for fracture with routine healing: Secondary | ICD-10-CM

## 2014-05-15 DIAGNOSIS — G5792 Unspecified mononeuropathy of left lower limb: Secondary | ICD-10-CM

## 2014-05-15 NOTE — Progress Notes (Signed)
He presents today for followup of his stress fracture fourth metatarsal left foot as well as his painful left heel. The last time he was in he had his second dehydrated alcohol injection.  Objective: Vital signs are stable he is alert and oriented x3. Pulses are palpable left foot. Pain on palpation medial continued tubercle of the left heel. Radiographic evaluation does demonstrate a very mild hallux reaction to stress fracture mid diaphyseal region fourth metatarsal left foot.  Assessment: Fourth metatarsal nondisplaced stress fracture slowly healing left. Plantar fasciitis Baxter's neuritis left foot.  Plan: Injected her third dose of dehydrated alcohol today to the point of maximal tenderness. Suggested he continue out of work for at least 2 more weeks at which time we will do another x-ray but he revisits the office.

## 2014-05-29 ENCOUNTER — Ambulatory Visit (INDEPENDENT_AMBULATORY_CARE_PROVIDER_SITE_OTHER): Payer: 59 | Admitting: Podiatry

## 2014-05-29 ENCOUNTER — Ambulatory Visit (INDEPENDENT_AMBULATORY_CARE_PROVIDER_SITE_OTHER): Payer: 59

## 2014-05-29 ENCOUNTER — Encounter: Payer: Self-pay | Admitting: Podiatry

## 2014-05-29 VITALS — BP 126/66 | HR 70 | Resp 16

## 2014-05-29 DIAGNOSIS — M8448XD Pathological fracture, other site, subsequent encounter for fracture with routine healing: Secondary | ICD-10-CM

## 2014-05-29 DIAGNOSIS — G5790 Unspecified mononeuropathy of unspecified lower limb: Secondary | ICD-10-CM

## 2014-05-29 DIAGNOSIS — G5792 Unspecified mononeuropathy of left lower limb: Secondary | ICD-10-CM

## 2014-05-29 DIAGNOSIS — M8430XD Stress fracture, unspecified site, subsequent encounter for fracture with routine healing: Secondary | ICD-10-CM

## 2014-05-29 NOTE — Progress Notes (Signed)
He presents today for followup of a metadiaphyseal stress fracture metatarsal the left foot. As well as for neuritis to the plantar aspect of his left heel. He states it metatarsal is still painful and continues to wear his boot. He did try a tennis shoe on and took one step and stated that it hurt too badly and he was unable to ambulate with her regular shoe. He does state that he plantar fasciitis seems to be resolving some degree.  Objective: Vital signs are stable he is alert and oriented x3 still has pain on palpation fourth metatarsal metadiaphyseal region left foot with mild edema. He has pain on palpation plantar aspect of the left heel. Radiographic evaluation does demonstrate a bone callus forming noted is a very minimal callus metadiaphyseal region fourth metatarsal left foot. There appears to be no displacement. No comminution. Minimal pain on palpation of the plantar medial band of the fascia. However he does still have pain on palpation the central band. This very well could be Baxter's nerve with neuritis.  Assessment: Neuritis right foot. Stress fracture left fourth metatarsal.  Plan: Continue use of the Cam Walker for the next month he will continue out of work until that time. I reinjected his left heel today with dehydrated alcohol I will followup with him in one month for another set of x-rays

## 2014-06-26 ENCOUNTER — Encounter: Payer: Self-pay | Admitting: Podiatry

## 2014-06-26 ENCOUNTER — Ambulatory Visit (INDEPENDENT_AMBULATORY_CARE_PROVIDER_SITE_OTHER): Payer: 59 | Admitting: Podiatry

## 2014-06-26 ENCOUNTER — Ambulatory Visit (INDEPENDENT_AMBULATORY_CARE_PROVIDER_SITE_OTHER): Payer: 59

## 2014-06-26 VITALS — BP 135/77 | HR 81 | Resp 16

## 2014-06-26 DIAGNOSIS — M8430XD Stress fracture, unspecified site, subsequent encounter for fracture with routine healing: Secondary | ICD-10-CM

## 2014-06-26 DIAGNOSIS — G5792 Unspecified mononeuropathy of left lower limb: Secondary | ICD-10-CM

## 2014-06-26 NOTE — Progress Notes (Signed)
He presents today for followup of a fracture fourth metatarsal of the left foot which he states is doing much better he states that on all his entire foot is 70% improved. Is also following up with [place] for neuritis and plantar fasciitis of the left foot. He states that he stopped utilizing the [employer] one week ago.  Objective: Vital signs are stable he is alert and oriented x3. Pulses are palpable left foot. No calf pain. He still has pain on palpation medial continued tubercle of the left heel. Radiographic evaluation does demonstrate well-healing mid diaphyseal fracture fourth metatarsal left foot.  Assessment: Well-healing fracture fourth met left. Neuritis plantar fasciitis left.  Plan: Reinjected his left heel today with dehydrated alcohol we'll discuss the need for EPAT for surgery next visit.

## 2014-07-17 ENCOUNTER — Ambulatory Visit (INDEPENDENT_AMBULATORY_CARE_PROVIDER_SITE_OTHER): Payer: 59

## 2014-07-17 ENCOUNTER — Ambulatory Visit (INDEPENDENT_AMBULATORY_CARE_PROVIDER_SITE_OTHER): Payer: 59 | Admitting: Podiatry

## 2014-07-17 DIAGNOSIS — S92002D Unspecified fracture of left calcaneus, subsequent encounter for fracture with routine healing: Secondary | ICD-10-CM

## 2014-07-17 DIAGNOSIS — G5792 Unspecified mononeuropathy of left lower limb: Secondary | ICD-10-CM

## 2014-07-17 DIAGNOSIS — S92302D Fracture of unspecified metatarsal bone(s), left foot, subsequent encounter for fracture with routine healing: Secondary | ICD-10-CM

## 2014-07-17 NOTE — Progress Notes (Signed)
He presents today follow-up fourth metatarsal fracture left foot. As well as neuritis/plantar fasciitis of the left heel.  Objective: Vital signs are stable he's alert and oriented 3. Pulses are strongly palpable bilateral. Neurologic sensorium is intact percent once the monofilament. Pain on palpation medial tubercle of the left heel.radiographic evaluation demonstrates well-healing fourth metatarsal fracture of the left foot.   Assessment:well-healing fracture fourth metatarsal left foot. Neuritis/plantar fasciitis left foot approximately 85% improved.  Plan: Reinjected hydrated all alcohol left heel. He ordered a second pair of orthotics today. Follow up with him in 3-4 weeks.

## 2014-08-14 ENCOUNTER — Ambulatory Visit (INDEPENDENT_AMBULATORY_CARE_PROVIDER_SITE_OTHER): Payer: 59 | Admitting: Podiatry

## 2014-08-14 ENCOUNTER — Encounter: Payer: Self-pay | Admitting: Podiatry

## 2014-08-14 VITALS — BP 142/79 | HR 65 | Resp 16

## 2014-08-14 DIAGNOSIS — G5792 Unspecified mononeuropathy of left lower limb: Secondary | ICD-10-CM

## 2014-08-14 NOTE — Progress Notes (Signed)
He presents today for follow-up of his neuritis and to pick up a second pair of orthotics. He states that he has had some regression to the neuritis of the left heel but minimally so.  Objective: Vital signs are stable he's alert and oriented 3. He has mild tenderness on palpation of the left heel.  Assessment: Neuritis left heel.  Plan: Reinjected his left heel today we will follow-up with him in 3 weeks at which time we will discuss the need for EPAT and/or surgery.

## 2014-09-13 ENCOUNTER — Ambulatory Visit: Payer: 59 | Admitting: Podiatry

## 2014-09-20 ENCOUNTER — Encounter: Payer: Self-pay | Admitting: Podiatry

## 2014-09-20 ENCOUNTER — Ambulatory Visit (INDEPENDENT_AMBULATORY_CARE_PROVIDER_SITE_OTHER): Payer: 59 | Admitting: Podiatry

## 2014-09-20 DIAGNOSIS — M722 Plantar fascial fibromatosis: Secondary | ICD-10-CM

## 2014-09-21 NOTE — Progress Notes (Signed)
He presents today for follow-up and consult regarding his left heel and arch pain. He states that the pain is very intermittent and has resolved by approximately 95%. However he continues to have an area of tenderness to the posterior lateral aspect of his right heel where a blister develops after utilizing orthotics. He is questioning whether surgery or shock wave should be considered for the remaining 5%.  Objective: Pulses are palpable left. No reproducible pain on palpation medial calcaneal tubercle of the left heel or medial longitudinal arch. Posterior lateral aspect of the left heel does demonstrate a very small callus and blister that appears to be pinpoint. I debrided this today there was no bleeding skin lines did not circumvent the lesion but capillaries are visible. This does not appear to be a typical wart. This does appear to be a blister with blood just beneath the skin.  Assessment: Well-healing plantar fasciitis left foot. Curious blister posterior lateral aspect of the left heel.  Plan: Discussed etiology pathology conservative versus surgical therapies. I discouraged any further treatment either with shockwave or surgery. I encouraged the use of his orthotics however I'm concerned that the current orthotics he is wearing in his work shoes or possibly causing this blister. I encouraged him to wear his other pair orthotics to see if this blister resolves. I will follow-up with him in a few weeks to reevaluate.

## 2014-10-18 ENCOUNTER — Encounter: Payer: Self-pay | Admitting: Podiatry

## 2014-10-18 ENCOUNTER — Ambulatory Visit (INDEPENDENT_AMBULATORY_CARE_PROVIDER_SITE_OTHER): Payer: 59 | Admitting: Podiatry

## 2014-10-18 VITALS — BP 159/69 | HR 72 | Resp 16

## 2014-10-18 DIAGNOSIS — M722 Plantar fascial fibromatosis: Secondary | ICD-10-CM

## 2014-10-20 NOTE — Progress Notes (Signed)
Douglas Robertson presents today for discussion regarding his left heel. He states that he still has pain occasionally in his left heel. He states that it is nowhere near as painful as it has been in the past. He states that the intermittent pain is just enough to cause him to question whether or not he should treat this further with surgery or shockwave therapy.  Objective: Vital signs are stable he is alert and oriented 3.  Assessment plantar fasciitis left heel. Abnormal hitting orthotic resulting in sore to his left heel.  Plan: [name] going to sent his orthotics back to have the heel flattened around the rim so that it will not rub the posterior aspect of his heel. We discussed pros and cons of surgery and EPAT. At this point he is going to hold off on both. We will notify him once his orthotics come in.

## 2014-10-23 ENCOUNTER — Encounter: Payer: Self-pay | Admitting: Orthopedic Surgery

## 2014-10-23 ENCOUNTER — Ambulatory Visit (INDEPENDENT_AMBULATORY_CARE_PROVIDER_SITE_OTHER): Payer: 59 | Admitting: Orthopedic Surgery

## 2014-10-23 DIAGNOSIS — M7522 Bicipital tendinitis, left shoulder: Secondary | ICD-10-CM

## 2014-10-23 DIAGNOSIS — M7521 Bicipital tendinitis, right shoulder: Secondary | ICD-10-CM

## 2014-10-23 DIAGNOSIS — M25561 Pain in right knee: Secondary | ICD-10-CM | POA: Insufficient documentation

## 2014-10-23 NOTE — Progress Notes (Signed)
Chief Complaint  Patient presents with  . Shoulder Pain    Bilateral shoulder pain request bilateral injections  . Knee Pain    Right knee pain 3-4 days     I'll make 2 separate notes for the 2 separate complaints first patient initially was scheduled for bilateral shoulder pain for bilateral injections which she gets approximately every 6 months. He complains of anterior shoulder pain along the anterior joint line without injury  He has tenderness over the biceps tendon and the rotator interval bilaterally  Right shoulder injection Verbal consent appropriate timeout Point of maximal tenderness, injected with 25-gauge needle 40 mg of Depo-Medrol and 3 mL 1% lidocaine  This was then repeated for left shoulder injection  New problem anterior knee pain for 3-4 days. Dull aching sensation front of the knee associated with giving out no swelling no history of trauma pain directly in the front of the knee behind the kneecap.  Review of systems this is not associated with numbness tingling or weakness in the right leg  Past Medical History  Diagnosis Date  . Hypertension    His appearance is normal is oriented 3 his mood is pleasant his gait is normal. He has tenderness and crepitance in the patellofemoral joint joint lines nontender range of motion. No effusion. Ligaments are stable and the cromolyn sagittal plane strength and muscle tone are normal skin is intact has roughening of the skin from constant kneeling no peripheral edema lymph nodes normal sensation to intact no pathologic reflexes balance is normal  Anterior knee pain recommend quadriceps strengthening exercises  Follow-up 6 months repeat injections of the shoulders if needed

## 2014-10-23 NOTE — Patient Instructions (Signed)
Knee exercises  

## 2014-11-15 ENCOUNTER — Encounter: Payer: Self-pay | Admitting: Podiatry

## 2014-11-15 ENCOUNTER — Ambulatory Visit (INDEPENDENT_AMBULATORY_CARE_PROVIDER_SITE_OTHER): Payer: 59 | Admitting: Podiatry

## 2014-11-15 VITALS — BP 108/46 | HR 65 | Resp 16

## 2014-11-15 DIAGNOSIS — M722 Plantar fascial fibromatosis: Secondary | ICD-10-CM | POA: Diagnosis not present

## 2014-11-16 NOTE — Progress Notes (Signed)
He presents today for follow-up of his chronic proximal plantar fasciitis of his left heel. He states that he also relates between 90-95% better. He states that at this point he feels that no further treatment would be beneficial.  Objective: Vital signs are stable he is alert and oriented 3. Pulses are palpable. No pain on palpation medial continue tubercle of the left heel.  Assessment: Well-healed plantar fasciitis 90-95% resolved.  Plan: I agree with him wholeheartedly think that any further invasive treatment may leave him with a deficit greater than 5-10% he has at this point. I will follow-up with him should his condition deteriorate.

## 2015-01-17 ENCOUNTER — Ambulatory Visit: Payer: 59 | Admitting: Podiatry

## 2015-01-22 ENCOUNTER — Ambulatory Visit (INDEPENDENT_AMBULATORY_CARE_PROVIDER_SITE_OTHER): Payer: 59

## 2015-01-22 ENCOUNTER — Ambulatory Visit (INDEPENDENT_AMBULATORY_CARE_PROVIDER_SITE_OTHER): Payer: 59 | Admitting: Podiatry

## 2015-01-22 ENCOUNTER — Encounter: Payer: Self-pay | Admitting: Podiatry

## 2015-01-22 DIAGNOSIS — R52 Pain, unspecified: Secondary | ICD-10-CM

## 2015-01-22 DIAGNOSIS — M7670 Peroneal tendinitis, unspecified leg: Secondary | ICD-10-CM | POA: Diagnosis not present

## 2015-01-22 DIAGNOSIS — M7672 Peroneal tendinitis, left leg: Secondary | ICD-10-CM

## 2015-01-22 NOTE — Progress Notes (Signed)
He presents today for a chief complaint of pain to the dorsal lateral aspect of the left foot 2 weeks. Denies any trauma to the foot. History of plantar fasciitis left. States that the plantar fasciitis is mostly resolved.  Objective: Vital signs are stable alert and oriented 3. Pain on palpation fifth metatarsal base as well as the peroneal brevis as it inserts into the bone. Radiographs confirm what appears to be soft tissue crease and distally to the peroneal tendon insertion site. There also appears to be some periostitis along the lateral border of the fifth met base left. I see no acute fracture.  Assessment: Insertional peroneal tendinitis left.  Plan: I injected the insertion site with dexamethasone and local anesthetic. I will follow-up with him in the near future for reevaluation. Remembers asking how his trip to [place] went.

## 2015-04-23 ENCOUNTER — Ambulatory Visit (INDEPENDENT_AMBULATORY_CARE_PROVIDER_SITE_OTHER): Payer: Commercial Managed Care - HMO | Admitting: Orthopedic Surgery

## 2015-04-23 VITALS — BP 114/63 | Ht 67.0 in

## 2015-04-23 DIAGNOSIS — M7522 Bicipital tendinitis, left shoulder: Secondary | ICD-10-CM | POA: Diagnosis not present

## 2015-04-23 DIAGNOSIS — M75101 Unspecified rotator cuff tear or rupture of right shoulder, not specified as traumatic: Secondary | ICD-10-CM

## 2015-04-23 DIAGNOSIS — M75102 Unspecified rotator cuff tear or rupture of left shoulder, not specified as traumatic: Secondary | ICD-10-CM | POA: Diagnosis not present

## 2015-04-23 DIAGNOSIS — M7521 Bicipital tendinitis, right shoulder: Secondary | ICD-10-CM

## 2015-04-24 ENCOUNTER — Encounter: Payer: Self-pay | Admitting: Orthopedic Surgery

## 2015-04-24 NOTE — Progress Notes (Signed)
Chief Complaint  Patient presents with  . Follow-up    6 month follow up bilateral shoulders + repeat injections    Procedure note the subacromial injection shoulder left   Verbal consent was obtained to inject the  Left   Shoulder  Timeout was completed to confirm the injection site is a subacromial space of the  left  shoulder  Medication used Depo-Medrol 40 mg and lidocaine 1% 3 cc  Anesthesia was provided by ethyl chloride  The injection was performed in the left  posterior subacromial space. After pinning the skin with alcohol and anesthetized the skin with ethyl chloride the subacromial space was injected using a 20-gauge needle. There were no complications  Sterile dressing was applied.          Procedure note the subacromial injection shoulder RIGHT  Verbal consent was obtained to inject the  RIGHT   Shoulder  Timeout was completed to confirm the injection site is a subacromial space of the  RIGHT  shoulder   Medication used Depo-Medrol 40 mg and lidocaine 1% 3 cc  Anesthesia was provided by ethyl chloride  The injection was performed in the RIGHT  posterior subacromial space. After pinning the skin with alcohol and anesthetized the skin with ethyl chloride the subacromial space was injected using a 20-gauge needle. There were no complications  Sterile dressing was applied.

## 2015-05-02 ENCOUNTER — Encounter: Payer: Self-pay | Admitting: Podiatry

## 2015-05-02 ENCOUNTER — Ambulatory Visit (INDEPENDENT_AMBULATORY_CARE_PROVIDER_SITE_OTHER): Payer: Commercial Managed Care - HMO | Admitting: Podiatry

## 2015-05-02 VITALS — BP 104/68 | HR 69 | Resp 12

## 2015-05-02 DIAGNOSIS — M722 Plantar fascial fibromatosis: Secondary | ICD-10-CM | POA: Diagnosis not present

## 2015-05-02 DIAGNOSIS — M7670 Peroneal tendinitis, unspecified leg: Secondary | ICD-10-CM

## 2015-05-02 DIAGNOSIS — M7672 Peroneal tendinitis, left leg: Secondary | ICD-10-CM

## 2015-05-02 MED ORDER — DICLOFENAC SODIUM 1 % TD GEL: 4.0000 g | Freq: Four times a day (QID) | TRANSDERMAL | Status: DC

## 2015-05-03 NOTE — Progress Notes (Signed)
He presents today for follow-up of his plantar fasciitis. He also states that he would like to consider a new para orthotics.   Objective: Vital signs are stable alert and oriented 3. He has no pain on palpation to plantar fascia or the posterior heel. He does retain pain to the fifth metatarsal base laterally. This does appear to be a metatarsalgia or even a peroneal tendinitis.  Assessment: plantar fasciitis. Tendinitis fifth metatarsal base metatarsalgia.  Plan: Rhoda prescription for diclofenac gel and order a new set of orthotics. I will follow up with him once has come in. Questions or concerns he will notify us.

## 2015-05-23 ENCOUNTER — Telehealth: Payer: Self-pay | Admitting: *Deleted

## 2015-05-23 NOTE — Telephone Encounter (Signed)
Patient came into the office and picked up his 2nd pair of orthotics.

## 2015-05-23 NOTE — Telephone Encounter (Signed)
left message 2nd pair of orthotics are in and ready for pick up no appointment necessary

## 2015-09-08 DIAGNOSIS — N189 Chronic kidney disease, unspecified: Secondary | ICD-10-CM

## 2015-09-08 HISTORY — DX: Chronic kidney disease, unspecified: N18.9

## 2015-09-18 DIAGNOSIS — R310 Gross hematuria: Secondary | ICD-10-CM | POA: Insufficient documentation

## 2015-09-19 ENCOUNTER — Other Ambulatory Visit (HOSPITAL_COMMUNITY): Payer: Self-pay | Admitting: Family Medicine

## 2015-09-19 DIAGNOSIS — R310 Gross hematuria: Secondary | ICD-10-CM

## 2015-09-25 ENCOUNTER — Ambulatory Visit (HOSPITAL_COMMUNITY)
Admission: RE | Admit: 2015-09-25 | Discharge: 2015-09-25 | Disposition: A | Discharge status: Home / Self Care | Payer: Commercial Managed Care - HMO | Source: Ambulatory Visit | Attending: Family Medicine | Admitting: Family Medicine

## 2015-09-25 DIAGNOSIS — N2889 Other specified disorders of kidney and ureter: Secondary | ICD-10-CM | POA: Insufficient documentation

## 2015-09-25 DIAGNOSIS — R319 Hematuria, unspecified: Secondary | ICD-10-CM | POA: Diagnosis present

## 2015-09-25 DIAGNOSIS — R109 Unspecified abdominal pain: Secondary | ICD-10-CM | POA: Insufficient documentation

## 2015-09-25 DIAGNOSIS — R310 Gross hematuria: Secondary | ICD-10-CM

## 2015-09-25 MED ORDER — IOHEXOL 300 MG/ML  SOLN
125.0000 mL | Freq: Once | INTRAMUSCULAR | Status: AC | PRN
  Administered 2015-09-25: 125 mL via INTRAVENOUS

## 2015-09-25 MED ORDER — SODIUM CHLORIDE 0.9 % IV SOLN
INTRAVENOUS | Status: AC
  Filled 2015-09-25: qty 250

## 2015-09-30 ENCOUNTER — Other Ambulatory Visit: Payer: Self-pay | Admitting: Urology

## 2015-09-30 ENCOUNTER — Encounter: Payer: Self-pay | Admitting: *Deleted

## 2015-10-01 NOTE — Patient Instructions (Signed)
Douglas Robertson.  10/01/2015   Your procedure is scheduled on: 10/10/2015    Report to [facility]  Entrance take [place]  elevators to 3rd floor to  [place] at    [phone] AM.  Call this number if you have problems the morning of surgery [phone]   Remember: ONLY 1 PERSON MAY GO WITH YOU TO SHORT STAY TO GET  READY MORNING OF [place].  Do not eat food or drink liquids :After Midnight.     Take these medicines the morning of surgery with A SIP OF WATER: none                                 You may not have any metal on your body including hair pins and              piercings  Do not wear jewelry, , lotions, powders or perfumes, deodorant                     Men may shave face and neck.   Do not bring valuables to the hospital. [place].  Contacts, dentures or bridgework may not be worn into surgery.  Leave suitcase in the car. After surgery it may be brought to your room.         Special Instructions: coughing and deep breathing exercises, leg exercises               Please read over the following fact sheets you were given: _____________________________________________________________________             [facility] - Preparing for Surgery Before surgery, you can play an important role.  Because skin is not sterile, your skin needs to be as free of germs as possible.  You can reduce the number of germs on your skin by washing with CHG (chlorahexidine gluconate) soap before surgery.  CHG is an antiseptic cleaner which kills germs and bonds with the skin to continue killing germs even after washing. Please DO NOT use if you have an allergy to CHG or antibacterial soaps.  If your skin becomes reddened/irritated stop using the CHG and inform your nurse when you arrive at Short Stay. Do not shave (including legs and underarms) for at least 48 hours prior to the first CHG shower.  You may  shave your face/neck. Please follow these instructions carefully:  1.  Shower with CHG Soap the night before surgery and the  morning of Surgery.  2.  If you choose to wash your hair, wash your hair first as usual with your  normal  shampoo.  3.  After you shampoo, rinse your hair and body thoroughly to remove the  shampoo.                           4.  Use CHG as you would any other liquid soap.  You can apply chg directly  to the skin and wash                       Gently with a scrungie or clean washcloth.  5.  Apply the CHG  Soap to your body ONLY FROM THE NECK DOWN.   Do not use on face/ open                           Wound or open sores. Avoid contact with eyes, ears mouth and genitals (private parts).                       Wash face,  Genitals (private parts) with your normal soap.             6.  Wash thoroughly, paying special attention to the area where your surgery  will be performed.  7.  Thoroughly rinse your body with warm water from the neck down.  8.  DO NOT shower/wash with your normal soap after using and rinsing off  the CHG Soap.                9.  Pat yourself dry with a clean towel.            10.  Wear clean pajamas.            11.  Place clean sheets on your bed the night of your first shower and do not  sleep with pets. Day of Surgery : Do not apply any lotions/deodorants the morning of surgery.  Please wear clean clothes to the hospital/surgery center.  FAILURE TO FOLLOW THESE INSTRUCTIONS MAY RESULT IN THE CANCELLATION OF YOUR SURGERY PATIENT SIGNATURE_________________________________  NURSE SIGNATURE__________________________________  ________________________________________________________________________   Adam Phenix  An incentive spirometer is a tool that can help keep your lungs clear and active. This tool measures how well you are filling your lungs with each breath. Taking long deep breaths may help reverse or decrease the chance of developing  breathing (pulmonary) problems (especially infection) following:  A long period of time when you are unable to move or be active. BEFORE THE PROCEDURE   If the spirometer includes an indicator to show your best effort, your nurse or respiratory therapist will set it to a desired goal.  If possible, sit up straight or lean slightly forward. Try not to slouch.  Hold the incentive spirometer in an upright position. INSTRUCTIONS FOR USE  1. Sit on the edge of your bed if possible, or sit up as far as you can in bed or on a chair. 2. Hold the incentive spirometer in an upright position. 3. Breathe out normally. 4. Place the mouthpiece in your mouth and seal your lips tightly around it. 5. Breathe in slowly and as deeply as possible, raising the piston or the ball toward the top of the column. 6. Hold your breath for 3-5 seconds or for as long as possible. Allow the piston or ball to fall to the bottom of the column. 7. Remove the mouthpiece from your mouth and breathe out normally. 8. Rest for a few seconds and repeat Steps 1 through 7 at least 10 times every 1-2 hours when you are awake. Take your time and take a few normal breaths between deep breaths. 9. The spirometer may include an indicator to show your best effort. Use the indicator as a goal to work toward during each repetition. 10. After each set of 10 deep breaths, practice coughing to be sure your lungs are clear. If you have an incision (the cut made at the time of surgery), support your incision when coughing by placing a pillow or rolled up towels  firmly against it. Once you are able to get out of bed, walk around indoors and cough well. You may stop using the incentive spirometer when instructed by your caregiver.  RISKS AND COMPLICATIONS  Take your time so you do not get dizzy or light-headed.  If you are in pain, you may need to take or ask for pain medication before doing incentive spirometry. It is harder to take a deep  breath if you are having pain. AFTER USE  Rest and breathe slowly and easily.  It can be helpful to keep track of a log of your progress. Your caregiver can provide you with a simple table to help with this. If you are using the spirometer at home, follow these instructions: [place] IF:   You are having difficultly using the spirometer.  You have trouble using the spirometer as often as instructed.  Your pain medication is not giving enough relief while using the spirometer.  You develop fever of 100.5 F (38.1 C) or higher. SEEK IMMEDIATE MEDICAL CARE IF:   You cough up bloody sputum that had not been present before.  You develop fever of 102 F (38.9 C) or greater.  You develop worsening pain at or near the incision site. MAKE SURE YOU:   Understand these instructions.  Will watch your condition.  Will get help right away if you are not doing well or get worse. Document Released: 01/04/2007 Document Revised: 11/16/2011 Document Reviewed: 03/07/2007 [facility] Patient Information 2014 [place], [place].   ________________________________________________________________________

## 2015-10-02 ENCOUNTER — Encounter (HOSPITAL_COMMUNITY): Payer: Self-pay

## 2015-10-02 ENCOUNTER — Encounter (HOSPITAL_COMMUNITY)
Admission: RE | Admit: 2015-10-02 | Discharge: 2015-10-02 | Disposition: A | Discharge status: Home / Self Care | Payer: Commercial Managed Care - HMO | Source: Ambulatory Visit | Attending: Urology | Admitting: Urology

## 2015-10-02 DIAGNOSIS — Z01810 Encounter for preprocedural cardiovascular examination: Secondary | ICD-10-CM | POA: Diagnosis not present

## 2015-10-02 DIAGNOSIS — Z01812 Encounter for preprocedural laboratory examination: Secondary | ICD-10-CM | POA: Insufficient documentation

## 2015-10-02 LAB — CBC
HCT: 39.4 % (ref 39.0–52.0)
HEMOGLOBIN: 12.8 g/dL — AB (ref 13.0–17.0)
MCH: 26 pg (ref 26.0–34.0)
MCHC: 32.5 g/dL (ref 30.0–36.0)
MCV: 79.9 fL (ref 78.0–100.0)
Platelets: 229 10*3/uL (ref 150–400)
RBC: 4.93 MIL/uL (ref 4.22–5.81)
RDW: 14.3 % (ref 11.5–15.5)
WBC: 7.3 10*3/uL (ref 4.0–10.5)

## 2015-10-02 LAB — ABO/RH: ABO/RH(D): A POS

## 2015-10-02 NOTE — Progress Notes (Signed)
[facility] and received CT chest results done 09/27/15 and CMP results done 09/27/15 and placed on chart.

## 2015-10-02 NOTE — Progress Notes (Signed)
Patient stated pharmacy told him to bring prescription bottle of Benicar from home since they do not have name brand Benicar in the pharmacy at [facility].

## 2015-10-03 NOTE — Progress Notes (Signed)
Final EKG done 10/02/2015 in [facility].

## 2015-10-09 NOTE — H&P (Signed)
Chief Complaint Left renal mass   History of Present Illness Douglas Robertson is a 50 year old with the following urologic history:    1) Right testicular pain: He developed pain in the area of his right epididymis in June 2009 after an inciting mild traumatic event.    2) Erectile dysfunction: He has mild erectile dysfunction which has not required treatment.  Baseline SHIM: 23    Interval history:    [name] presents today with a new complaint of an 11.3 cm left renal mass. He developed painless gross hematuria about 1 1/2 weeks ago with subsequent moderate left flank pain occurring 1 week after his hematuria had begun. He was seen by his PCP, Dr. Quillian Quince, and a CT scan of the abdomen and pelvis with and without contrast was performed demonstrating a large 11.3 x 9.0 x 8.8 cm enhancing left renal mass off the upper and interpolar regions of the left kidney. No renal vein involvement was noted. No regional lymphadenopathy was present. He has a simple right renal cyst. No adrenal lesions noted. No evidence of abdominal metastatic disease. He has a single left renal artery and a circumaortic left renal vein. He was seen by Dr. Diona Fanti late last week and underwent a CT scan of the chest which did demonstrate a non-specific 4 mm lingular nodular without other concerning findings. His serum Cr is 0.99 and LFTs were normal. Dr. Diona Fanti performed cystoscopy that was unremarkable.    He states that he denies any current pain symptoms and his hematuria has improved.   Past Medical History Problems  1. History of hypertension (Z86.79)  Surgical History Problems  1. History of Knee Surgery  Current Meds 1. Benicar HCT 40-12.5 MG Oral Tablet;  Therapy: 20Mar2012 to Recorded 2. Fish Oil CAPS;  Therapy: (Recorded:10Mar2015) to Recorded 3. Red Yeast Rice CAPS;  Therapy: (Recorded:10Mar2015) to Recorded  Allergies Medication  1. No Known Drug Allergies  Family History Problems  1.  Family history of Family Health Status - Father's Age   76 2. Family history of Family Health Status - Mother's Age   97 3. Family history of Family Health Status Children ___ Living Daughters   2 4. Family history of Heart Disease : Father 5. Family history of Hematuria : Father  Social History Problems  1. Denied: History of Alcohol Use (History)   maybe 3 beers a week 2. Former smoker [phone])   smoked 1 ppd for 15 years; quit 10 years ago 3. Marital History - Currently Married 4. Occupation:   maintenance  Review of Systems Genitourinary, constitutional, skin, eye, otolaryngeal, hematologic/lymphatic, cardiovascular, pulmonary, endocrine, musculoskeletal, gastrointestinal, neurological and psychiatric system(s) were reviewed and pertinent findings if present are noted and are otherwise negative.  Genitourinary: hematuria.  Constitutional: no night sweats and no recent weight loss.  Hematologic/Lymphatic: no swollen glands.    Vitals Vital Signs [Data Includes: Last 1 Day]  Recorded: 25Jan2017 02:34PM  Blood Pressure: 106 / 71 Temperature: 98.1 F Heart Rate: 68 Recorded: 20Jan2017 11:07AM  Height: 5 ft 7 in Weight: 230 lb  BMI Calculated: 36.02 BSA Calculated: 2.15 Blood Pressure: 120 / 62 Temperature: 98.5 F Heart Rate: 80  Physical Exam Constitutional: Well nourished and well developed . No acute distress.  ENT:. The ears and nose are normal in appearance.  Neck: The appearance of the neck is normal and no neck mass is present.  Pulmonary: No respiratory distress, normal respiratory rhythm and effort and clear bilateral breath sounds.  Cardiovascular:  Heart rate and rhythm are normal . No peripheral edema.  Abdomen: The abdomen is soft and nontender. No masses are palpated. No CVA tenderness. No hernias are palpable. No hepatosplenomegaly noted. He does have some fullness in his left upper quadrant without a definite mass able to be palpated.  Lymphatics:  The supraclavicular, femoral and inguinal nodes are not enlarged or tender.  Skin: Normal skin turgor, no visible rash and no visible skin lesions.  Neuro/Psych:. Mood and affect are appropriate.    Results/Data Urine [Data Includes: Last 1 Day]   [id number]  COLOR YELLOW   APPEARANCE CLEAR   SPECIFIC GRAVITY 1.025   pH 5.5   GLUCOSE TRACE   BILIRUBIN NEGATIVE   KETONE NEGATIVE   BLOOD 2+   PROTEIN NEGATIVE   NITRITE NEGATIVE   LEUKOCYTE ESTERASE NEGATIVE   SQUAMOUS EPITHELIAL/HPF 0-5 HPF  WBC NONE SEEN WBC/HPF  RBC 0-2 RBC/HPF  BACTERIA NONE SEEN HPF  CRYSTALS NONE SEEN HPF  CASTS NONE SEEN LPF  Yeast NONE SEEN HPF  Selected Results  CMP with Estimated GFR 20Jan2017 12:19PM Dahlstedt, Annie [place]  SPECIMEN TYPE: BLOOD   Test Name Result Flag Reference  SODIUM 132 mmol/L L 135-146  POTASSIUM 3.9 mmol/L  3.5-5.3  CHLORIDE 95 mmol/L L 98-110  CO2 25 mmol/L  20-31  GLUCOSE 237 mg/dL H 65-99  BUN 20 mg/dL  7-25  CREATININE 0.99 mg/dL  0.50-1.50  BILIRUBIN, TOTAL 0.3 mg/dL  0.2-1.2  ALKALINE PHOSPHATASE 52 U/L  40-115  AST/SGOT 30 U/L  10-40  ALT/SGPT 31 U/L  9-46  TOTAL PROTEIN 8.0 g/dL  6.1-8.1  ALBUMIN 4.3 g/dL  3.6-5.1  CALCIUM 10.0 mg/dL  8.6-10.3  Est GFR, African American >89 mL/min  >=60  Est GFR, NonAfrican American 89 mL/min  >=60  THE ESTIMATED GFR IS A CALCULATION VALID FOR ADULTS (>=22 YEARS OLD) THAT USES THE CKD-EPI ALGORITHM TO ADJUST FOR AGE AND SEX. IT IS   NOT TO BE USED FOR CHILDREN, PREGNANT WOMEN, HOSPITALIZED PATIENTS,    PATIENTS ON DIALYSIS, OR WITH RAPIDLY CHANGING KIDNEY FUNCTION. ACCORDING TO THE NKDEP, EGFR >89 IS NORMAL, 60-89 SHOWS MILD IMPAIRMENT, 30-59 SHOWS MODERATE IMPAIRMENT, 15-29 SHOWS SEVERE IMPAIRMENT AND <15 IS ESRD.   CT-CHEST WITH CONTRAST 20Jan2017 12:00AM Franchot Gallo   Test Name Result Flag Reference  CT-CHEST WITH CONTRAST (Report)    ** RADIOLOGY REPORT BY [facility] **   CLINICAL DATA: Chest staging  after detection of large left renal mass on recent CT abdomen study.  EXAM: CT CHEST WITH CONTRAST  TECHNIQUE: Multidetector CT imaging of the chest was performed during intravenous contrast administration.  CONTRAST: 100 cc Isovue-300 IV.  COMPARISON: 09/25/2015 CT abdomen/ pelvis.  FINDINGS: Mediastinum/Nodes: Normal heart size. No significant pericardial fluid/thickening. Coronary atherosclerosis. Great vessels are normal in course and caliber. No central pulmonary emboli. Normal visualized thyroid. Normal esophagus. No pathologically enlarged axillary, mediastinal or hilar lymph nodes.  Lungs/Pleura: No pneumothorax. No pleural effusion. Lingular 4 mm solid pulmonary nodule (series 3/ image 41). No acute consolidative airspace disease, additional significant pulmonary nodules or lung masses.  Upper abdomen: Diffuse hepatic steatosis. Partially visualized 5.4 cm renal cyst in the posterior upper right kidney, the visualized portions of which appear simple. Subcentimeter hypodense renal cortical lesion in the upper right kidney, too small to characterize. Partial visualization of large heterogeneously enhancing solid renal mass in the left kidney. Subcentimeter hypodense renal cortical lesion in the medial upper left kidney, too small to characterize.  Musculoskeletal: No aggressive  appearing focal osseous lesions. Moderate degenerative changes in thoracic spine.  IMPRESSION: 1. Solitary 4 mm lingular pulmonary nodule, indeterminate. A follow-up chest CT is recommended in 3 months. 2. No thoracic lymphadenopathy. 3. Coronary atherosclerosis. 4. Known large enhancing left renal mass, please see 09/25/2015 CT abdomen report for further details. 5. Diffuse hepatic steatosis.   Electronically Signed  By: Ilona Sorrel M.D.  On: 09/27/2015 16:40    I have independently reviewed his medical records, cystoscopy report, CT scan of the chest and abdomen, and laboratory  studies. Findings are as outlined above.  Assessment Assessed  1. 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 UA With REFLEX; [Do Not Release]; Status:Complete;   Done: [id number] 02:15PM Neoplasm of left kidney  2. Follow-up Keep Future Appt Office  Follow-up  Status: Complete  Done: [id number]  Discussion/Summary 1. Large left renal mass concerning for renal cell carcinoma: I had a long and detailed discussion with Mr. Shimabukuro, his wife, and his daughters today. Unfortunately, his father just passed away which is further complicated his recent medical situation. However, he has adamantly wishes to proceed as planned for treatment as soon as possible.   The patient was provided information regarding their renal mass including the relative risk of benign versus malignant pathology and the natural history of renal cell carcinoma and other possible malignancies of the kidney. The role of renal biopsy, laboratory testing, and imaging studies to further characterize renal masses and/or the presence of metastatic disease were explained. We discussed the role of active surveillance, surgical therapy with both radical nephrectomy and nephron-sparing surgery, and ablative therapy in the treatment of renal masses. In addition, we discussed our goals of providing an accurate diagnosis and oncologic control while maintaining optimal renal function as appropriate based on the size, location, and complexity of their renal mass as well as their co-morbidities.    We have discussed the risks of treatment in detail including but not limited to bleeding, infection, heart attack, stroke, death, venothromoboembolism, cancer recurrence, injury/damage to surrounding organs and structures, urine leak, the possibility of open surgical conversion for patients undergoing minimally invasive surgery, the risk of developing chronic kidney disease and its associated implications, and the potential risk of end stage  renal disease possibly necessitating dialysis.     All questions have been answered to his and his family stated satisfaction. He is scheduled to proceed with a left laparoscopic radical nephrectomy next week.    Cc: Dr. Franchot Gallo  Dr. Kern Alberta  A total of 65 minutes were spent in the overall care of the patient today with 45 minutes in direct face to face consultation.    Amendment  We did discuss his small pulmonary nodule which is nonspecific and not likely to represent metastatic disease. However, he will undergo repeat CT imaging of the chest within 6 months.1     1 Amended By: Raynelle Bring; Oct 02 2015 4:53 PM EST  Signatures Electronically signed by : Raynelle Bring, M.D.; Oct 02 2015  4:54PM EST

## 2015-10-10 ENCOUNTER — Encounter (HOSPITAL_COMMUNITY): Payer: Self-pay | Admitting: *Deleted

## 2015-10-10 ENCOUNTER — Inpatient Hospital Stay (HOSPITAL_COMMUNITY): Payer: Commercial Managed Care - HMO | Admitting: Registered Nurse

## 2015-10-10 ENCOUNTER — Encounter (HOSPITAL_COMMUNITY)
Admission: RE | Disposition: A | Discharge status: Home / Self Care | Payer: Self-pay | Source: Ambulatory Visit | Attending: Urology

## 2015-10-10 ENCOUNTER — Inpatient Hospital Stay (HOSPITAL_COMMUNITY)
Admission: RE | Admit: 2015-10-10 | Discharge: 2015-10-13 | DRG: 658 | Disposition: A | Discharge status: Home / Self Care | Payer: Commercial Managed Care - HMO | Source: Ambulatory Visit | Attending: Urology | Admitting: Urology

## 2015-10-10 DIAGNOSIS — Z87891 Personal history of nicotine dependence: Secondary | ICD-10-CM

## 2015-10-10 DIAGNOSIS — E669 Obesity, unspecified: Secondary | ICD-10-CM | POA: Diagnosis present

## 2015-10-10 DIAGNOSIS — N529 Male erectile dysfunction, unspecified: Secondary | ICD-10-CM | POA: Diagnosis present

## 2015-10-10 DIAGNOSIS — Z01812 Encounter for preprocedural laboratory examination: Secondary | ICD-10-CM

## 2015-10-10 DIAGNOSIS — C642 Malignant neoplasm of left kidney, except renal pelvis: Principal | ICD-10-CM | POA: Diagnosis present

## 2015-10-10 DIAGNOSIS — I10 Essential (primary) hypertension: Secondary | ICD-10-CM | POA: Diagnosis present

## 2015-10-10 DIAGNOSIS — Z6835 Body mass index (BMI) 35.0-35.9, adult: Secondary | ICD-10-CM

## 2015-10-10 DIAGNOSIS — Z8249 Family history of ischemic heart disease and other diseases of the circulatory system: Secondary | ICD-10-CM | POA: Diagnosis not present

## 2015-10-10 DIAGNOSIS — R310 Gross hematuria: Secondary | ICD-10-CM | POA: Diagnosis present

## 2015-10-10 DIAGNOSIS — N2889 Other specified disorders of kidney and ureter: Secondary | ICD-10-CM | POA: Diagnosis present

## 2015-10-10 DIAGNOSIS — D49512 Neoplasm of unspecified behavior of left kidney: Secondary | ICD-10-CM | POA: Diagnosis present

## 2015-10-10 HISTORY — PX: LAPAROSCOPIC NEPHRECTOMY: SHX1930

## 2015-10-10 HISTORY — DX: Chronic kidney disease, unspecified: N18.9

## 2015-10-10 LAB — BASIC METABOLIC PANEL
ANION GAP: 8 (ref 5–15)
BUN: 12 mg/dL (ref 6–20)
CHLORIDE: 99 mmol/L — AB (ref 101–111)
CO2: 28 mmol/L (ref 22–32)
Calcium: 9.3 mg/dL (ref 8.9–10.3)
Creatinine, Ser: 1.2 mg/dL (ref 0.61–1.24)
GFR calc non Af Amer: >60 mL/min (ref >60)
Glucose, Bld: 209 mg/dL — ABNORMAL HIGH (ref 65–99)
POTASSIUM: 4.4 mmol/L (ref 3.5–5.1)
Sodium: 135 mmol/L (ref 135–145)

## 2015-10-10 LAB — HEMOGLOBIN AND HEMATOCRIT, BLOOD
HEMATOCRIT: 33.8 % — AB (ref 39.0–52.0)
Hemoglobin: 11.2 g/dL — ABNORMAL LOW (ref 13.0–17.0)

## 2015-10-10 LAB — TYPE AND SCREEN
ABO/RH(D): A POS
Antibody Screen: NEGATIVE

## 2015-10-10 SURGERY — NEPHRECTOMY, RADICAL, LAPAROSCOPIC, ADULT
Anesthesia: General | Laterality: Left

## 2015-10-10 MED ORDER — FENTANYL CITRATE (PF) 100 MCG/2ML IJ SOLN
INTRAMUSCULAR | Status: DC | PRN
  Administered 2015-10-10 (×4): 50 ug via INTRAVENOUS
  Administered 2015-10-10 (×2): 25 ug via INTRAVENOUS

## 2015-10-10 MED ORDER — HYDROMORPHONE HCL 1 MG/ML IJ SOLN
0.5000 mg | INTRAMUSCULAR | Status: DC | PRN
  Administered 2015-10-10 – 2015-10-11 (×2): 1 mg via INTRAVENOUS
  Filled 2015-10-10 (×2): qty 1

## 2015-10-10 MED ORDER — LACTATED RINGERS IV SOLN
INTRAVENOUS | Status: DC
  Administered 2015-10-10: 1000 mL via INTRAVENOUS

## 2015-10-10 MED ORDER — DIPHENHYDRAMINE HCL 50 MG/ML IJ SOLN
12.5000 mg | Freq: Four times a day (QID) | INTRAMUSCULAR | Status: DC | PRN
Start: 2015-10-10 — End: 2015-10-13

## 2015-10-10 MED ORDER — CEFAZOLIN SODIUM-DEXTROSE 2-3 GM-% IV SOLR
2.0000 g | INTRAVENOUS | Status: AC
  Administered 2015-10-10: 2 g via INTRAVENOUS

## 2015-10-10 MED ORDER — HYDROMORPHONE HCL 1 MG/ML IJ SOLN
INTRAMUSCULAR | Status: AC
  Filled 2015-10-10: qty 1

## 2015-10-10 MED ORDER — 0.9 % SODIUM CHLORIDE (POUR BTL) OPTIME
TOPICAL | Status: DC | PRN
  Administered 2015-10-10: 1000 mL

## 2015-10-10 MED ORDER — DEXTROSE-NACL 5-0.45 % IV SOLN
INTRAVENOUS | Status: DC
  Administered 2015-10-10 – 2015-10-11 (×2): via INTRAVENOUS

## 2015-10-10 MED ORDER — ONDANSETRON HCL 4 MG/2ML IJ SOLN: 4.0000 mg | Freq: Four times a day (QID) | INTRAMUSCULAR | Status: DC | PRN

## 2015-10-10 MED ORDER — ONDANSETRON HCL 4 MG/2ML IJ SOLN
INTRAMUSCULAR | Status: AC
  Filled 2015-10-10: qty 2

## 2015-10-10 MED ORDER — MIDAZOLAM HCL 2 MG/2ML IJ SOLN
INTRAMUSCULAR | Status: AC
  Filled 2015-10-10: qty 2

## 2015-10-10 MED ORDER — ROCURONIUM BROMIDE 100 MG/10ML IV SOLN
INTRAVENOUS | Status: AC
  Filled 2015-10-10: qty 1

## 2015-10-10 MED ORDER — HYDROMORPHONE HCL 1 MG/ML IJ SOLN
INTRAMUSCULAR | Status: DC | PRN
  Administered 2015-10-10: 0.5 mg via INTRAVENOUS
  Administered 2015-10-10: .3 mg via INTRAVENOUS
  Administered 2015-10-10: .2 mg via INTRAVENOUS

## 2015-10-10 MED ORDER — PROPOFOL 10 MG/ML IV BOLUS
INTRAVENOUS | Status: DC | PRN
  Administered 2015-10-10: 200 mg via INTRAVENOUS

## 2015-10-10 MED ORDER — SUCCINYLCHOLINE CHLORIDE 20 MG/ML IJ SOLN
INTRAMUSCULAR | Status: DC | PRN
  Administered 2015-10-10: 100 mg via INTRAVENOUS

## 2015-10-10 MED ORDER — ONDANSETRON HCL 4 MG/2ML IJ SOLN
INTRAMUSCULAR | Status: DC | PRN
  Administered 2015-10-10: 4 mg via INTRAVENOUS

## 2015-10-10 MED ORDER — LIDOCAINE HCL (CARDIAC) 20 MG/ML IV SOLN
INTRAVENOUS | Status: AC
  Filled 2015-10-10: qty 5

## 2015-10-10 MED ORDER — SUGAMMADEX SODIUM 200 MG/2ML IV SOLN
INTRAVENOUS | Status: DC | PRN
  Administered 2015-10-10: 200 mg via INTRAVENOUS

## 2015-10-10 MED ORDER — HYDROMORPHONE HCL 1 MG/ML IJ SOLN
0.2500 mg | INTRAMUSCULAR | Status: DC | PRN
  Administered 2015-10-10 (×4): 0.5 mg via INTRAVENOUS

## 2015-10-10 MED ORDER — DIPHENHYDRAMINE HCL 12.5 MG/5ML PO ELIX: 12.5000 mg | ORAL_SOLUTION | Freq: Four times a day (QID) | ORAL | Status: DC | PRN

## 2015-10-10 MED ORDER — LACTATED RINGERS IR SOLN
Status: DC | PRN
  Administered 2015-10-10: 1000 mL

## 2015-10-10 MED ORDER — STERILE WATER FOR IRRIGATION IR SOLN
Status: DC | PRN
  Administered 2015-10-10: 1000 mL

## 2015-10-10 MED ORDER — MIDAZOLAM HCL 5 MG/5ML IJ SOLN
INTRAMUSCULAR | Status: DC | PRN
  Administered 2015-10-10: 2 mg via INTRAVENOUS

## 2015-10-10 MED ORDER — SUGAMMADEX SODIUM 200 MG/2ML IV SOLN
INTRAVENOUS | Status: AC
  Filled 2015-10-10: qty 2

## 2015-10-10 MED ORDER — CEFAZOLIN SODIUM-DEXTROSE 2-3 GM-% IV SOLR
INTRAVENOUS | Status: AC
  Filled 2015-10-10: qty 50

## 2015-10-10 MED ORDER — ACETAMINOPHEN 10 MG/ML IV SOLN
1000.0000 mg | Freq: Four times a day (QID) | INTRAVENOUS | Status: DC
  Administered 2015-10-10 – 2015-10-11 (×2): 1000 mg via INTRAVENOUS
  Filled 2015-10-10 (×4): qty 100

## 2015-10-10 MED ORDER — ROCURONIUM BROMIDE 100 MG/10ML IV SOLN
INTRAVENOUS | Status: DC | PRN
  Administered 2015-10-10: 20 mg via INTRAVENOUS
  Administered 2015-10-10: 10 mg via INTRAVENOUS
  Administered 2015-10-10: 40 mg via INTRAVENOUS
  Administered 2015-10-10 (×4): 10 mg via INTRAVENOUS

## 2015-10-10 MED ORDER — OXYCODONE HCL 5 MG PO TABS: 5.0000 mg | ORAL_TABLET | Freq: Once | ORAL | Status: DC | PRN

## 2015-10-10 MED ORDER — DOCUSATE SODIUM 100 MG PO CAPS
100.0000 mg | ORAL_CAPSULE | Freq: Two times a day (BID) | ORAL | Status: DC
  Administered 2015-10-10 – 2015-10-13 (×6): 100 mg via ORAL
  Filled 2015-10-10 (×6): qty 1

## 2015-10-10 MED ORDER — CEFAZOLIN SODIUM 1-5 GM-% IV SOLN
1.0000 g | Freq: Three times a day (TID) | INTRAVENOUS | Status: AC
  Administered 2015-10-10 – 2015-10-11 (×2): 1 g via INTRAVENOUS
  Filled 2015-10-10 (×2): qty 50

## 2015-10-10 MED ORDER — FENTANYL CITRATE (PF) 250 MCG/5ML IJ SOLN
INTRAMUSCULAR | Status: AC
  Filled 2015-10-10: qty 5

## 2015-10-10 MED ORDER — ONDANSETRON HCL 4 MG/2ML IJ SOLN: 4.0000 mg | INTRAMUSCULAR | Status: DC | PRN

## 2015-10-10 MED ORDER — LIDOCAINE HCL (CARDIAC) 20 MG/ML IV SOLN
INTRAVENOUS | Status: DC | PRN
  Administered 2015-10-10: 100 mg via INTRAVENOUS

## 2015-10-10 MED ORDER — OXYCODONE HCL 5 MG/5ML PO SOLN
5.0000 mg | Freq: Once | ORAL | Status: DC | PRN
  Filled 2015-10-10: qty 5

## 2015-10-10 MED ORDER — PROPOFOL 10 MG/ML IV BOLUS
INTRAVENOUS | Status: AC
  Filled 2015-10-10: qty 20

## 2015-10-10 MED ORDER — BUPIVACAINE LIPOSOME 1.3 % IJ SUSP
20.0000 mL | Freq: Once | INTRAMUSCULAR | Status: AC
  Administered 2015-10-10: 20 mL
  Filled 2015-10-10: qty 20

## 2015-10-10 MED ORDER — EPHEDRINE SULFATE 50 MG/ML IJ SOLN
INTRAMUSCULAR | Status: DC | PRN
  Administered 2015-10-10 (×3): 5 mg via INTRAVENOUS

## 2015-10-10 MED ORDER — HYDROMORPHONE HCL 2 MG/ML IJ SOLN
INTRAMUSCULAR | Status: AC
  Filled 2015-10-10: qty 1

## 2015-10-10 MED ORDER — HYDROCODONE-ACETAMINOPHEN 5-325 MG PO TABS: 1.0000 | ORAL_TABLET | Freq: Four times a day (QID) | ORAL | Status: DC | PRN

## 2015-10-10 MED ORDER — SODIUM CHLORIDE 0.9 % IJ SOLN
INTRAMUSCULAR | Status: AC
  Filled 2015-10-10: qty 20

## 2015-10-10 SURGICAL SUPPLY — 57 items
BAG SPEC THK2 15X12 ZIP CLS (MISCELLANEOUS)
BAG ZIPLOCK 12X15 (MISCELLANEOUS) ×1 IMPLANT
BLADE EXTENDED COATED 6.5IN (ELECTRODE) IMPLANT
BLADE SURG SZ10 CARB STEEL (BLADE) ×2 IMPLANT
CHLORAPREP W/TINT 26ML (MISCELLANEOUS) ×3 IMPLANT
CLIP LIGATING HEM O LOK PURPLE (MISCELLANEOUS) ×5 IMPLANT
CLIP LIGATING HEMO LOK XL GOLD (MISCELLANEOUS) ×2 IMPLANT
CLIP LIGATING HEMO O LOK GREEN (MISCELLANEOUS) ×5 IMPLANT
COVER SURGICAL LIGHT HANDLE (MISCELLANEOUS) ×3 IMPLANT
CUTTER FLEX LINEAR 45M (STAPLE) ×2 IMPLANT
DRAIN CHANNEL 10F 3/8 F FF (DRAIN) IMPLANT
DRAPE INCISE IOBAN 66X45 STRL (DRAPES) ×3 IMPLANT
DRAPE LAPAROSCOPIC ABDOMINAL (DRAPES) IMPLANT
DRAPE WARM FLUID 44X44 (DRAPE) IMPLANT
DRSG TEGADERM 4X4.75 (GAUZE/BANDAGES/DRESSINGS) ×1 IMPLANT
ELECT PENCIL ROCKER SW 15FT (MISCELLANEOUS) ×3 IMPLANT
ELECT REM PT RETURN 9FT ADLT (ELECTROSURGICAL) ×3
ELECTRODE REM PT RTRN 9FT ADLT (ELECTROSURGICAL) ×1 IMPLANT
EVACUATOR SILICONE 100CC (DRAIN) IMPLANT
GLOVE BIO SURGEON STRL SZ 6.5 (GLOVE) ×2 IMPLANT
GLOVE BIO SURGEONS STRL SZ 6.5 (GLOVE) ×1
GLOVE BIOGEL M STRL SZ7.5 (GLOVE) ×5 IMPLANT
GOWN STRL REUS W/TWL LRG LVL3 (GOWN DISPOSABLE) ×8 IMPLANT
HEMOSTAT SURGICEL 4X8 (HEMOSTASIS) IMPLANT
KIT BASIN OR (CUSTOM PROCEDURE TRAY) ×3 IMPLANT
LIQUID BAND (GAUZE/BANDAGES/DRESSINGS) ×3 IMPLANT
POSITIONER SURGICAL ARM (MISCELLANEOUS) ×6 IMPLANT
POUCH ENDO CATCH II 15MM (MISCELLANEOUS) ×3 IMPLANT
RELOAD 45 VASCULAR/THIN (ENDOMECHANICALS) ×6 IMPLANT
RELOAD STAPLE 45 2.5 WHT GRN (ENDOMECHANICALS) IMPLANT
RETRACTOR LAPSCP 12X46 CVD (ENDOMECHANICALS) IMPLANT
RTRCTR LAPSCP 12X46 CVD (ENDOMECHANICALS)
SCISSORS LAP 5X35 DISP (ENDOMECHANICALS) ×3 IMPLANT
SET IRRIG TUBING LAPAROSCOPIC (IRRIGATION / IRRIGATOR) ×3 IMPLANT
SHEARS HARMONIC ACE PLUS 36CM (ENDOMECHANICALS) ×3 IMPLANT
SPONGE LAP 18X18 X RAY DECT (DISPOSABLE) ×2 IMPLANT
SPONGE SURGIFOAM ABS GEL 100 (HEMOSTASIS) IMPLANT
SURGIFLO W/THROMBIN 8M KIT (HEMOSTASIS) IMPLANT
SUT ETHILON 3 0 PS 1 (SUTURE) IMPLANT
SUT MNCRL AB 4-0 PS2 18 (SUTURE) ×6 IMPLANT
SUT PDS AB 1 CTX 36 (SUTURE) ×6 IMPLANT
SUT VIC AB 0 CT1 27 (SUTURE) ×3
SUT VIC AB 0 CT1 27XBRD ANTBC (SUTURE) IMPLANT
SUT VIC AB 2-0 SH 27 (SUTURE)
SUT VIC AB 2-0 SH 27X BRD (SUTURE) IMPLANT
SUT VICRYL 0 UR6 27IN ABS (SUTURE) ×4 IMPLANT
TOWEL OR 17X26 10 PK STRL BLUE (TOWEL DISPOSABLE) ×4 IMPLANT
TRAY FOLEY W/METER SILVER 14FR (SET/KITS/TRAYS/PACK) ×1 IMPLANT
TRAY FOLEY W/METER SILVER 16FR (SET/KITS/TRAYS/PACK) ×3 IMPLANT
TRAY LAPAROSCOPIC (CUSTOM PROCEDURE TRAY) ×3 IMPLANT
TROCAR BLADELESS OPT 5 100 (ENDOMECHANICALS) ×3 IMPLANT
TROCAR BLADELESS OPT 5 75 (ENDOMECHANICALS) ×1 IMPLANT
TROCAR XCEL 12X100 BLDLESS (ENDOMECHANICALS) ×3 IMPLANT
TROCAR XCEL BLUNT TIP 100MML (ENDOMECHANICALS) ×3 IMPLANT
TUBING INSUF HEATED (TUBING) ×2 IMPLANT
TUBING INSUFFLATION 10FT LAP (TUBING) IMPLANT
YANKAUER SUCT BULB TIP 10FT TU (MISCELLANEOUS) ×3 IMPLANT

## 2015-10-10 NOTE — Anesthesia Procedure Notes (Signed)
Procedure Name: Intubation Date/Time: 10/10/2015 12:32 PM Performed by: Carleene Cooper A Pre-anesthesia Checklist: Patient identified, Emergency Drugs available, Suction available, Patient being monitored and Timeout performed Patient Re-evaluated:Patient Re-evaluated prior to inductionOxygen Delivery Method: Circle system utilized Preoxygenation: Pre-oxygenation with 100% oxygen Intubation Type: IV induction Ventilation: Mask ventilation without difficulty and Oral airway inserted - appropriate to patient size Laryngoscope Size: Mac and 4 Grade View: Grade I Tube type: Oral Tube size: 7.5 mm Number of attempts: 1 Airway Equipment and Method: Stylet Placement Confirmation: ETT inserted through vocal cords under direct vision,  positive ETCO2 and breath sounds checked- equal and bilateral Secured at: 22 cm Tube secured with: Tape Dental Injury: Teeth and Oropharynx as per pre-operative assessment

## 2015-10-10 NOTE — Transfer of Care (Signed)
Immediate Anesthesia Transfer of Care Note  Patient: Douglas Robertson.  Procedure(s) Performed: Procedure(s): LAPAROSCOPIC RADICAL NEPHRECTOMY (Left)  Patient Location: PACU  Anesthesia Type:General  Level of Consciousness: awake, alert , oriented and patient cooperative  Airway & Oxygen Therapy: Patient Spontanous Breathing and Patient connected to face mask oxygen  Post-op Assessment: Report given to RN, Post -op Vital signs reviewed and stable and Patient moving all extremities  Post vital signs: Reviewed and stable  Last Vitals:  Filed Vitals:   10/10/15 0906  BP: 117/69  Pulse: 64  Temp: 36.8 C  Resp: 18    Complications: No apparent anesthesia complications

## 2015-10-10 NOTE — Interval H&P Note (Signed)
History and Physical Interval Note:  10/10/2015 11:24 AM  Douglas Ann.  has presented today for surgery, with the diagnosis of LEFT RENAL MASS  The various methods of treatment have been discussed with the patient and family. After consideration of risks, benefits and other options for treatment, the patient has consented to  Procedure(s): LAPAROSCOPIC RADICAL NEPHRECTOMY (Left) as a surgical intervention .  The patient's history has been reviewed, patient examined, no change in status, stable for surgery.  I have reviewed the patient's chart and labs.  Questions were answered to the patient's satisfaction.     Fredrik Mogel,LES

## 2015-10-10 NOTE — Op Note (Signed)
Preoperative diagnosis: Left renal neoplasm  Postoperative diagnosis: Left renal neoplasm  Procedure: 1.  Left laparoscopic radical nephrectomy  Surgeon: Pryor Curia. M.D.  Assistant(s): Debbrah Alar, PA-C  Resident: Dr. Verdis Frederickson  Anesthesia: General  Complications: None  EBL: 400 mL  IVF:  3000 mL crystalloid  Specimens: 1. Left kidney  Disposition of specimens: Pathology  Indication: Douglas Robertson. is a 50 y.o. patient with a left renal tumor suspicious for malignancy.  After a thorough review of the management options for their renal mass, they elected to proceed with surgical treatment and the above procedure.  We have discussed the potential benefits and risks of the procedure, side effects of the proposed treatment, the likelihood of the patient achieving the goals of the procedure, and any potential problems that might occur during the procedure or recuperation. Informed consent has been obtained.  Description of procedure:  The patient was taken to the operating room and a general anesthetic was administered. The patient was given preoperative antibiotics, placed in the left modified flank position, and prepped and draped in the usual sterile fashion. Next a preoperative timeout was performed.  A site was selected near the umbilicus for placement of the camera port. This was placed using a standard open Hassan technique which allowed entry into the peritoneal cavity under direct vision and without difficulty. A 12 mm Hassan cannula was placed and a pneumoperitoneum established. The camera was then used to inspect the abdomen and there was no evidence of any intra-abdominal injuries or other abnormalities. The remaining abdominal ports were then placed. A 12 mm port was placed in the left lower quadrant and a 5 mm port was placed in the left upper quadrant.  All ports were placed under direct vision without difficulty.  Utilizing the harmonic scalpel, the white  line of Toldt was incised allowing the colon to be reflected medially and the plane between the mesocolon and the anterior layer of Gerota's fascia to be developed and the kidney exposed.  The ureter and gonadal vein were identified inferiorly and the ureter was lifted anteriorly off the psoas muscle.  Dissection proceeded superiorly along the gonadal vein until the renal vein was identified.  The renal hilum was then carefully isolated with a combination of blunt and sharp dissectiong allowing the renal arterial and venous structures to be separated and isolated. There was a circumaortic renal vein with two renal arteries identified.  There was a superior artery that entered the hilum between the main renal vein and a more superior renal vein.  There was a second main renal artery that was between the main renal vein and a posterior renal vein.  This resulted in a challenging dissection.  Although I was able to dissect out the arteries from the veins, I was concerned about placing clips on the arteries with the need to staple the veins.  Therefore, I separated the posterior renal vein and artery away from the main renal vein and superior vascular structures into two separate hilar vessel packages.  I then used the 45 mm Flex ETS stapler to take each package en bloc.  Gerota's fascia was intentionally entered superiorly and the space between the adrenal gland and the kidney was developed allowing the adrenal gland to be spared.  The splenorenal ligaments were divided with the harmonic scalpel.  The lateral and posterior attachements to the kidney were then divided.  The ureter was ligated with Weck clips and divided allowing the specimen to be  freed from all surrounding structures.  The kidney specimen was then placed into a 15 mm Endocatch II retrieval bag.  The renal hilum, liver, adrenal bed and gonadal vein areas were each inspected and hemostasis was ensured with the pneomperitoneal pressures lowered.  The  12 mm lower quadrant port was then closed with a 0-vicryl suture placed laparoscopically to close the fascia of this incision. All remaining ports were removed under direct vision.  The kidney specimen was removed intact within the retrieval bag via the camera port site after this incision was extended slightly. This fascial opening was then closed with two #1 PDS sutures.    All incisions were injected with local anesthetic and reapproximated at the skin with 4-0 monocryl sutures.  Dermabond was applied to the skin. The patient tolerated the procedure well and without complications and was transferred to the recovery unit in satisfactory condition.   Pryor Curia MD

## 2015-10-10 NOTE — Anesthesia Preprocedure Evaluation (Addendum)
Anesthesia Evaluation  Patient identified by MRN, date of birth, ID band Patient awake    Reviewed: Allergy & Precautions, NPO status , Patient's Chart, lab work & pertinent test results  Airway Mallampati: II   Neck ROM: full    Dental   Pulmonary former smoker,    breath sounds clear to auscultation       Cardiovascular hypertension,  Rhythm:regular Rate:Normal     Neuro/Psych    GI/Hepatic   Endo/Other  obese  Renal/GU      Musculoskeletal  (+) Arthritis ,   Abdominal   Peds  Hematology   Anesthesia Other Findings   Reproductive/Obstetrics                           Anesthesia Physical Anesthesia Plan  ASA: II  Anesthesia Plan: General   Post-op Pain Management:    Induction: Intravenous  Airway Management Planned: Oral ETT  Additional Equipment:   Intra-op Plan:   Post-operative Plan: Extubation in OR  Informed Consent: I have reviewed the patients History and Physical, chart, labs and discussed the procedure including the risks, benefits and alternatives for the proposed anesthesia with the patient or authorized representative who has indicated his/her understanding and acceptance.     Plan Discussed with: CRNA, Anesthesiologist and Surgeon  Anesthesia Plan Comments:         Anesthesia Quick Evaluation

## 2015-10-10 NOTE — Progress Notes (Signed)
Post-op note  Subjective: The patient is doing well.  No complaints.  Objective: Vital signs in last 24 hours: Temp:  [97.9 F (36.6 C)-98.3 F (36.8 C)] 97.9 F (36.6 C) (02/02 1626) Pulse Rate:  [64-86] 79 (02/02 1700) Resp:  [12-18] 12 (02/02 1700) BP: (117-142)/(65-81) 135/65 mmHg (02/02 1700) SpO2:  [98 %-100 %] 99 % (02/02 1700) Weight:  [102.059 kg (225 lb)] 102.059 kg (225 lb) (02/02 0943)  Intake/Output from previous day:   Intake/Output this shift: Total I/O In: 3450 [I.V.:3450] Out: 1325 [Urine:925; Blood:400]  Physical Exam:  General: Alert and oriented. Abdomen: Soft, Nondistended. Incisions: Clean and dry.  Lab Results:  Recent Labs  10/10/15 1634  HGB 11.2*  HCT 33.8*    Assessment/Plan: POD#0   1) Continue to monitor 2)  Ambulate twice tonight   Pryor Curia. MD   LOS: 0 days   Kayron Kalmar,LES 10/10/2015, 5:18 PM

## 2015-10-10 NOTE — Discharge Instructions (Signed)

## 2015-10-10 NOTE — Anesthesia Postprocedure Evaluation (Signed)
Anesthesia Post Note  Patient: Douglas Robertson.  Procedure(s) Performed: Procedure(s) (LRB): LAPAROSCOPIC RADICAL NEPHRECTOMY (Left)  Patient location during evaluation: PACU Anesthesia Type: General Level of consciousness: awake and alert Pain management: pain level controlled Vital Signs Assessment: post-procedure vital signs reviewed and stable Respiratory status: spontaneous breathing, nonlabored ventilation, respiratory function stable and patient connected to nasal cannula oxygen Cardiovascular status: blood pressure returned to baseline and stable Postop Assessment: no signs of nausea or vomiting Anesthetic complications: no    Last Vitals:  Filed Vitals:   10/10/15 1645 10/10/15 1700  BP: 131/67 135/65  Pulse: 80 79  Temp:    Resp: 15 12    Last Pain:  Filed Vitals:   10/10/15 1710  PainSc: 4                  Aribella Vavra A

## 2015-10-11 LAB — BASIC METABOLIC PANEL
ANION GAP: 7 (ref 5–15)
BUN: 10 mg/dL (ref 6–20)
CHLORIDE: 98 mmol/L — AB (ref 101–111)
CO2: 27 mmol/L (ref 22–32)
Calcium: 8.3 mg/dL — ABNORMAL LOW (ref 8.9–10.3)
Creatinine, Ser: 1.2 mg/dL (ref 0.61–1.24)
GFR calc non Af Amer: >60 mL/min (ref >60)
GLUCOSE: 301 mg/dL — AB (ref 65–99)
POTASSIUM: 3.9 mmol/L (ref 3.5–5.1)
Sodium: 132 mmol/L — ABNORMAL LOW (ref 135–145)

## 2015-10-11 LAB — GLUCOSE, CAPILLARY: GLUCOSE-CAPILLARY: 135 mg/dL — AB (ref 65–99)

## 2015-10-11 LAB — HEMOGLOBIN AND HEMATOCRIT, BLOOD
HEMATOCRIT: 30.7 % — AB (ref 39.0–52.0)
Hemoglobin: 10 g/dL — ABNORMAL LOW (ref 13.0–17.0)

## 2015-10-11 MED ORDER — BISACODYL 10 MG RE SUPP
10.0000 mg | RECTAL | Status: DC | PRN
  Administered 2015-10-12: 10 mg via RECTAL
  Filled 2015-10-11: qty 1

## 2015-10-11 MED ORDER — METOCLOPRAMIDE HCL 5 MG/ML IJ SOLN
5.0000 mg | Freq: Four times a day (QID) | INTRAMUSCULAR | Status: DC
  Administered 2015-10-11 – 2015-10-13 (×7): 5 mg via INTRAVENOUS
  Filled 2015-10-11 (×3): qty 2
  Filled 2015-10-11 (×3): qty 1
  Filled 2015-10-11: qty 2
  Filled 2015-10-11 (×4): qty 1

## 2015-10-11 MED ORDER — BISACODYL 10 MG RE SUPP
10.0000 mg | Freq: Once | RECTAL | Status: AC
  Administered 2015-10-11: 10 mg via RECTAL
  Filled 2015-10-11: qty 1

## 2015-10-11 MED ORDER — SODIUM CHLORIDE 0.9 % IV BOLUS (SEPSIS)
250.0000 mL | Freq: Once | INTRAVENOUS | Status: AC
  Administered 2015-10-11: 250 mL via INTRAVENOUS

## 2015-10-11 MED ORDER — DEXTROSE-NACL 5-0.45 % IV SOLN
INTRAVENOUS | Status: DC
  Administered 2015-10-11: 20:00:00 via INTRAVENOUS

## 2015-10-11 MED ORDER — HYDROCODONE-ACETAMINOPHEN 5-325 MG PO TABS
1.0000 | ORAL_TABLET | Freq: Four times a day (QID) | ORAL | Status: DC | PRN
  Administered 2015-10-11: 2 via ORAL
  Administered 2015-10-11 – 2015-10-12 (×2): 1 via ORAL
  Administered 2015-10-12: 2 via ORAL
  Administered 2015-10-12 – 2015-10-13 (×3): 1 via ORAL
  Filled 2015-10-11 (×2): qty 1
  Filled 2015-10-11: qty 2
  Filled 2015-10-11: qty 1
  Filled 2015-10-11 (×2): qty 2
  Filled 2015-10-11 (×2): qty 1

## 2015-10-11 NOTE — Progress Notes (Signed)
1 Day Post-Op Subjective: The patient is doing well.  No nausea or vomiting. Pain is adequately controlled.  Objective: Vital signs in last 24 hours: Temp:  [97.9 F (36.6 C)-98.5 F (36.9 C)] 98.1 F (36.7 C) (02/03 0554) Pulse Rate:  [61-86] 61 (02/03 0554) Resp:  [12-18] 14 (02/03 0554) BP: (98-148)/(42-81) 103/60 mmHg (02/03 0554) SpO2:  [93 %-100 %] 100 % (02/03 0554) Weight:  [102.059 kg (225 lb)] 102.059 kg (225 lb) (02/02 1727)  Intake/Output from previous day: 02/02 0701 - 02/03 0700 In: 6295 [P.O.:720; I.V.:5325; IV Piggyback:250] Out: 3900 [Urine:3500; Blood:400] Intake/Output this shift:    Physical Exam:  General: Alert and oriented. CV: RRR Lungs: Clear bilaterally. GI: Soft, Nondistended. Incisions: Clean and dry. Urine: Clear Extremities: Nontender, no erythema, no edema.  Lab Results:  Recent Labs  10/10/15 1634 10/11/15 0526  HGB 11.2* 10.0*  HCT 33.8* 30.7*          Recent Labs  10/10/15 1634 10/11/15 0526  CREATININE 1.20 1.20           Results for orders placed or performed during the hospital encounter of 10/10/15 (from the past 24 hour(s))  Basic metabolic panel     Status: Abnormal   Collection Time: 10/10/15  4:34 PM  Result Value Ref Range   Sodium 135 135 - 145 mmol/L   Potassium 4.4 3.5 - 5.1 mmol/L   Chloride 99 (L) 101 - 111 mmol/L   CO2 28 22 - 32 mmol/L   Glucose, Bld 209 (H) 65 - 99 mg/dL   BUN 12 6 - 20 mg/dL   Creatinine, Ser 1.20 0.61 - 1.24 mg/dL   Calcium 9.3 8.9 - 10.3 mg/dL   GFR calc non Af Amer >60 >60 mL/min   GFR calc Af Amer >60 >60 mL/min   Anion gap 8 5 - 15  Hemoglobin and hematocrit, blood     Status: Abnormal   Collection Time: 10/10/15  4:34 PM  Result Value Ref Range   Hemoglobin 11.2 (L) 13.0 - 17.0 g/dL   HCT 33.8 (L) 39.0 - [id number] %  Basic metabolic panel     Status: Abnormal   Collection Time: 10/11/15  5:26 AM  Result Value Ref Range   Sodium 132 (L) 135 - 145 mmol/L   Potassium 3.9 3.5 -  5.1 mmol/L   Chloride 98 (L) 101 - 111 mmol/L   CO2 27 22 - 32 mmol/L   Glucose, Bld 301 (H) 65 - 99 mg/dL   BUN 10 6 - 20 mg/dL   Creatinine, Ser 1.20 0.61 - 1.24 mg/dL   Calcium 8.3 (L) 8.9 - 10.3 mg/dL   GFR calc non Af Amer >60 >60 mL/min   GFR calc Af Amer >60 >60 mL/min   Anion gap 7 5 - 15  Hemoglobin and hematocrit, blood     Status: Abnormal   Collection Time: 10/11/15  5:26 AM  Result Value Ref Range   Hemoglobin 10.0 (L) 13.0 - 17.0 g/dL   HCT 30.7 (L) 39.0 - 52.0 %    Assessment/Plan: POD# 1 s/p laparoscopic nephrectomy.  1) Ambulate, Incentive spirometry 2) Advance diet as tolerated 3) Transition to oral pain medication 4) Dulcolax suppository 5) D/C urethral catheter    LOS: 1 day   Douglas Robertson 10/11/2015, 7:17 AM

## 2015-10-11 NOTE — Progress Notes (Addendum)
1 Day Post-Op Subjective: The patient is doing well, has not passed flatus or BM and feeling distended. Bowel sounds are normoactive. Ambulated x 3 today. Limited appetite because of bloated feeling. Suppository x1 earlier today with no effect.  Objective: Vital signs in last 24 hours: Temp:  [97.9 F (36.6 C)-98.5 F (36.9 C)] 98.1 F (36.7 C) (02/03 0806) Pulse Rate:  [57-86] 57 (02/03 0806) Resp:  [12-15] 14 (02/03 0806) BP: (98-148)/(42-81) 106/55 mmHg (02/03 0806) SpO2:  [93 %-100 %] 98 % (02/03 0806) Weight:  [102.059 kg (225 lb)] 102.059 kg (225 lb) (02/02 1727)  Intake/Output from previous day: 02/02 0701 - 02/03 0700 In: 6295 [P.O.:720; I.V.:5325; IV Piggyback:250] Out: 3900 [Urine:3500; Blood:400] Intake/Output this shift: Total I/O In: 240 [P.O.:240] Out: -   Physical Exam:  General: Alert and oriented. CV: RRR Lungs: Clear bilaterally. GI: Soft, Nondistended. Abdomen: soft, mildly distended, NABS Incisions: Clean and dry. Urine: Clear Extremities: Nontender, no erythema, no edema.  Lab Results:  Recent Labs  10/10/15 1634 10/11/15 0526  HGB 11.2* 10.0*  HCT 33.8* 30.7*           Recent Labs  10/10/15 1634 10/11/15 0526  CREATININE 1.20 1.20           Results for orders placed or performed during the hospital encounter of 10/10/15 (from the past 24 hour(s))  Basic metabolic panel     Status: Abnormal   Collection Time: 10/10/15  4:34 PM  Result Value Ref Range   Sodium 135 135 - 145 mmol/L   Potassium 4.4 3.5 - 5.1 mmol/L   Chloride 99 (L) 101 - 111 mmol/L   CO2 28 22 - 32 mmol/L   Glucose, Bld 209 (H) 65 - 99 mg/dL   BUN 12 6 - 20 mg/dL   Creatinine, Ser 1.20 0.61 - 1.24 mg/dL   Calcium 9.3 8.9 - 10.3 mg/dL   GFR calc non Af Amer >60 >60 mL/min   GFR calc Af Amer >60 >60 mL/min   Anion gap 8 5 - 15  Hemoglobin and hematocrit, blood     Status: Abnormal   Collection Time: 10/10/15  4:34 PM  Result Value Ref Range   Hemoglobin 11.2 (L)  13.0 - 17.0 g/dL   HCT 33.8 (L) 39.0 - [id number] %  Basic metabolic panel     Status: Abnormal   Collection Time: 10/11/15  5:26 AM  Result Value Ref Range   Sodium 132 (L) 135 - 145 mmol/L   Potassium 3.9 3.5 - 5.1 mmol/L   Chloride 98 (L) 101 - 111 mmol/L   CO2 27 22 - 32 mmol/L   Glucose, Bld 301 (H) 65 - 99 mg/dL   BUN 10 6 - 20 mg/dL   Creatinine, Ser 1.20 0.61 - 1.24 mg/dL   Calcium 8.3 (L) 8.9 - 10.3 mg/dL   GFR calc non Af Amer >60 >60 mL/min   GFR calc Af Amer >60 >60 mL/min   Anion gap 7 5 - 15  Hemoglobin and hematocrit, blood     Status: Abnormal   Collection Time: 10/11/15  5:26 AM  Result Value Ref Range   Hemoglobin 10.0 (L) 13.0 - 17.0 g/dL   HCT 30.7 (L) 39.0 - 52.0 %  Glucose, capillary     Status: Abnormal   Collection Time: 10/11/15 12:34 PM  Result Value Ref Range   Glucose-Capillary 135 (H) 65 - 99 mg/dL    Assessment/Plan: POD# 1 s/p laparoscopic nephrectomy. AROBF.  1) Ambulate at  least TID 2) diet as tolerated 3) additional suppository this PM available 4) up in chair while not ambulatory 5) anticipate D/C tomorrow AM    LOS: 1 day   Star Age 10/11/2015, 1:20 PM

## 2015-10-11 NOTE — Discharge Summary (Signed)
Date of admission: 10/10/2015  Date of discharge: 10/11/2015  Admission diagnosis: LEFT renal mass  Discharge diagnosis: LEFT renal mass  Secondary diagnoses:  Past Medical History  Diagnosis Date  . Hypertension   . Arthritis     lower back   . Chronic kidney disease 2017    left renal mass    History and Physical: For full details, please see admission history and physical. Briefly, Douglas Robertson. is a 50 y.o. year old patient with LEFT renal mass in setting of gross hematuria. Please see H&P for full details.   Hospital Course:   Patient was admitted following uncomplicated LEFT laparoscopic nephrectomy on 10/10/15. He was transferred to the post-surgical floor in stable condition. Diet was slowly advanced. Foley catheter was removed POD 1. He was able to void without difficulty. Vitals and serum labs were within normal limits. His pain was well controlled. He had some mild bloating and failure to pass flatus or BM on POD1 and was kept for monitoring until POD 3. He was discharged home on POD 3. He will follow-up as scheduled for pathology review.  Laboratory values:  Recent Labs  10/10/15 1634 10/11/15 0526  HGB 11.2* 10.0*  HCT 33.8* 30.7*    Recent Labs  10/10/15 1634 10/11/15 0526  CREATININE 1.20 1.20   BP 103/60 mmHg  Pulse 61  Temp(Src) 98.1 F (36.7 C) (Oral)  Resp 14  Ht 5\' 7"  (1.702 m)  Wt 102.059 kg (225 lb)  BMI 35.23 kg/m2  SpO2 100%  Physical Exam:  Vital signs in last 24 hours: Temp:  [97.9 F (36.6 C)-98.5 F (36.9 C)] 98.1 F (36.7 C) (02/03 0554) Pulse Rate:  [61-86] 61 (02/03 0554) Resp:  [12-18] 14 (02/03 0554) BP: (98-148)/(42-81) 103/60 mmHg (02/03 0554) SpO2:  [93 %-100 %] 100 % (02/03 0554) Weight:  [102.059 kg (225 lb)] 102.059 kg (225 lb) (02/02 1727)  Constitutional:  Alert and oriented, No acute distress Cardiovascular: Regular rate and rhythm, No JVD Respiratory: Normal respiratory effort, SORA GI: Abdomen is soft,  nontender, nondistended, no abdominal masses Genitourinary: No CVAT. Normal male phallus, testes are descended bilaterally and non-tender and without masses, scrotum is normal in appearance without lesions or masses, perineum is normal on inspection. Rectal: deferred Neurologic: Grossly intact, no focal deficits Psychiatric: Normal mood and affect  Disposition: Home  Discharge instruction: The patient was instructed to be ambulatory but told to refrain from heavy lifting, strenuous activity, or driving.   Discharge medications:    Medication List    STOP taking these medications        diclofenac sodium 1 % Gel  Commonly known as:  VOLTAREN     FISH OIL PO     RED YEAST RICE PO     traMADol 50 MG tablet  Commonly known as:  ULTRAM      TAKE these medications        BENICAR HCT 40-12.5 MG tablet  Generic drug:  olmesartan-hydrochlorothiazide  Take 1 tablet by mouth daily.     HYDROcodone-acetaminophen 5-325 MG tablet  Commonly known as:  NORCO  Take 1-2 tablets by mouth every 6 (six) hours as needed.        Followup:      Follow-up Information    Follow up with Dutch Gray, MD On 11/05/2015.   Specialty:  Urology   Why:  at 9:15   Contact information:   [place] [address] [phone]

## 2015-10-11 NOTE — Care Management Note (Signed)
Case Management Note  Patient Details  Name: Douglas Robertson. MRN: [id number] Date of Birth: 1965-11-28  Subjective/Objective:49 y/o m admitted w/renal mass. S/p partial nephrectomy. From home.                    Action/Plan:d/c plan home.   Expected Discharge Date:                  Expected Discharge Plan:  Home/Self Care  In-House Referral:     Discharge planning Services  CM Consult  Post Acute Care Choice:    Choice offered to:     DME Arranged:    DME Agency:     HH Arranged:    HH Agency:     Status of Service:  In process, will continue to follow  Medicare Important Message Given:    Date Medicare IM Given:    Medicare IM give by:    Date Additional Medicare IM Given:    Additional Medicare Important Message give by:     If discussed at [place] of Stay Meetings, dates discussed:    Additional Comments:  Dessa Phi, RN 10/11/2015, 4:06 PM

## 2015-10-11 NOTE — Progress Notes (Signed)
Patient ID: Douglas Ann., male   DOB: 1966-06-26, 50 y.o.   MRN: [id number]  Pt with abdominal bloating.  Not passing flatus yet.  He has been ambulating well. Tolerating diet.  Dulcolax suppository tonight.  Will restart IVF overnight and begin Reglan 5 mg IV q 6 hrs.  If passing flatus/feeling better in morning, can be discharged home.

## 2015-10-12 LAB — GLUCOSE, CAPILLARY: Glucose-Capillary: 177 mg/dL — ABNORMAL HIGH (ref 65–99)

## 2015-10-12 MED ORDER — LACTATED RINGERS IV SOLN
INTRAVENOUS | Status: DC
  Administered 2015-10-12: 14:00:00 via INTRAVENOUS

## 2015-10-12 MED ORDER — DEXTROSE-NACL 5-0.45 % IV SOLN: INTRAVENOUS | Status: DC

## 2015-10-12 NOTE — Progress Notes (Signed)
Feels distended and cannot pass gas in spite of suppositories Belly tympanic and tender but benign  Incisions looks ok Nontoxic Minimal po fluid today and increase rate of iv

## 2015-10-13 MED ORDER — FLEET ENEMA 7-19 GM/118ML RE ENEM
1.0000 | ENEMA | Freq: Once | RECTAL | Status: AC
  Administered 2015-10-13: 1 via RECTAL
  Filled 2015-10-13: qty 1

## 2015-10-13 MED ORDER — OLMESARTAN MEDOXOMIL-HCTZ 40-12.5 MG PO TABS
1.0000 | ORAL_TABLET | Freq: Every day | ORAL | Status: DC
  Administered 2015-10-13: 1 via ORAL

## 2015-10-13 MED ORDER — IRBESARTAN 300 MG PO TABS
300.0000 mg | ORAL_TABLET | Freq: Every day | ORAL | Status: DC
  Filled 2015-10-13: qty 1

## 2015-10-13 NOTE — Progress Notes (Signed)
Ileus better but still present incsions look great Belly distended but soft and much less tender Looks good Send home today IF FEELING better otherwise tomorrow

## 2015-10-29 ENCOUNTER — Ambulatory Visit: Payer: Commercial Managed Care - HMO | Admitting: Orthopedic Surgery

## 2016-01-28 DIAGNOSIS — J209 Acute bronchitis, unspecified: Secondary | ICD-10-CM | POA: Insufficient documentation

## 2016-05-06 ENCOUNTER — Emergency Department (HOSPITAL_COMMUNITY): Payer: Commercial Managed Care - HMO

## 2016-05-06 ENCOUNTER — Emergency Department (HOSPITAL_COMMUNITY)
Admission: EM | Admit: 2016-05-06 | Discharge: 2016-05-06 | Disposition: A | Discharge status: Home / Self Care | Payer: Commercial Managed Care - HMO | Attending: Emergency Medicine | Admitting: Emergency Medicine

## 2016-05-06 ENCOUNTER — Encounter (HOSPITAL_COMMUNITY): Payer: Self-pay

## 2016-05-06 DIAGNOSIS — E1165 Type 2 diabetes mellitus with hyperglycemia: Secondary | ICD-10-CM | POA: Diagnosis not present

## 2016-05-06 DIAGNOSIS — Z85528 Personal history of other malignant neoplasm of kidney: Secondary | ICD-10-CM | POA: Insufficient documentation

## 2016-05-06 DIAGNOSIS — R079 Chest pain, unspecified: Secondary | ICD-10-CM

## 2016-05-06 DIAGNOSIS — N189 Chronic kidney disease, unspecified: Secondary | ICD-10-CM | POA: Insufficient documentation

## 2016-05-06 DIAGNOSIS — Z87891 Personal history of nicotine dependence: Secondary | ICD-10-CM | POA: Diagnosis not present

## 2016-05-06 DIAGNOSIS — R739 Hyperglycemia, unspecified: Secondary | ICD-10-CM

## 2016-05-06 DIAGNOSIS — I129 Hypertensive chronic kidney disease with stage 1 through stage 4 chronic kidney disease, or unspecified chronic kidney disease: Secondary | ICD-10-CM | POA: Insufficient documentation

## 2016-05-06 HISTORY — DX: Malignant (primary) neoplasm, unspecified: C80.1

## 2016-05-06 LAB — CBC WITH DIFFERENTIAL/PLATELET
Basophils Absolute: 0.1 10*3/uL (ref 0.0–0.1)
Basophils Relative: 1 %
Eosinophils Absolute: 0.2 10*3/uL (ref 0.0–0.7)
Eosinophils Relative: 3 %
HCT: 38.2 % — ABNORMAL LOW (ref 39.0–52.0)
Hemoglobin: 12.8 g/dL — ABNORMAL LOW (ref 13.0–17.0)
Lymphocytes Relative: 23 %
Lymphs Abs: 1.4 10*3/uL (ref 0.7–4.0)
MCH: 27.6 pg (ref 26.0–34.0)
MCHC: 33.5 g/dL (ref 30.0–36.0)
MCV: 82.5 fL (ref 78.0–100.0)
Monocytes Absolute: 0.4 10*3/uL (ref 0.1–1.0)
Monocytes Relative: 7 %
Neutro Abs: 3.8 10*3/uL (ref 1.7–7.7)
Neutrophils Relative %: 66 %
Platelets: 158 10*3/uL (ref 150–400)
RBC: 4.63 MIL/uL (ref 4.22–5.81)
RDW: 13.5 % (ref 11.5–15.5)
WBC: 5.8 10*3/uL (ref 4.0–10.5)

## 2016-05-06 LAB — BASIC METABOLIC PANEL
Anion gap: 9 (ref 5–15)
BUN: 23 mg/dL — ABNORMAL HIGH (ref 6–20)
CO2: 25 mmol/L (ref 22–32)
Calcium: 9.3 mg/dL (ref 8.9–10.3)
Chloride: 97 mmol/L — ABNORMAL LOW (ref 101–111)
Creatinine, Ser: 1.45 mg/dL — ABNORMAL HIGH (ref 0.61–1.24)
GFR calc Af Amer: >60 mL/min (ref >60)
GFR calc non Af Amer: 55 mL/min — ABNORMAL LOW (ref >60)
Glucose, Bld: 427 mg/dL — ABNORMAL HIGH (ref 65–99)
Potassium: 4.3 mmol/L (ref 3.5–5.1)
Sodium: 131 mmol/L — ABNORMAL LOW (ref 135–145)

## 2016-05-06 LAB — TROPONIN I
Troponin I: <0.03 ng/mL (ref <0.03)
Troponin I: <0.03 ng/mL (ref <0.03)

## 2016-05-06 LAB — CBG MONITORING, ED: Glucose-Capillary: 272 mg/dL — ABNORMAL HIGH (ref 65–99)

## 2016-05-06 MED ORDER — METFORMIN HCL 500 MG PO TABS: 500.0000 mg | ORAL_TABLET | Freq: Two times a day (BID) | ORAL | 0 refills | Status: DC

## 2016-05-06 MED ORDER — SODIUM CHLORIDE 0.9 % IV BOLUS (SEPSIS)
2000.0000 mL | Freq: Once | INTRAVENOUS | Status: AC
  Administered 2016-05-06: 2000 mL via INTRAVENOUS

## 2016-05-06 NOTE — ED Provider Notes (Signed)
[facility] DEPT Provider Note   CSN: [id number] Arrival date & time: 05/06/16  1306     History   Chief Complaint Chief Complaint  Patient presents with  . Chest Pain    HPI Douglas Robertson. is a 50 y.o. male.  HPI   24yM with CP. L sided. Intermittent for the past couple day. Anterior/L chest and radiates into back. No appreciable exacerbating or relieving factors. No respiratory complaints. No palpitations. No cough, fever or chills. No unusual leg pain or swelling. Did get mildly nauseated with earlier episode today. Reports previous stress testing normal. No known CAD. Hx of HTN. Father developed cardiac problems when he was in his 29s.   Past Medical History:  Diagnosis Date  . Arthritis    lower back   . Cancer ([place])    kidney (L)  . Chronic kidney disease 2017   left renal mass  . Hypertension     Patient Active Problem List   Diagnosis Date Noted  . 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 Biceps tendinitis of both shoulders 10/23/2014  . Right anterior knee pain 10/23/2014  . 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 Left shoulder pain 04/12/2013    Past Surgical History:  Procedure Laterality Date  . KNEE ARTHROSCOPY  1998    right   . LAPAROSCOPIC NEPHRECTOMY Left 10/10/2015   Procedure: LAPAROSCOPIC RADICAL NEPHRECTOMY;  Surgeon: Raynelle Bring, MD;  Location: [facility];  Service: Urology;  Laterality: Left;       Home Medications    Prior to Admission medications   Medication Sig Start Date End Date Taking? Authorizing Provider  BENICAR HCT 40-12.5 MG per tablet Take 1 tablet by mouth daily.  03/06/14   Historical Provider, MD  HYDROcodone-acetaminophen (NORCO) 5-325 MG tablet Take 1-2 tablets by mouth every 6 (six) hours as needed. 10/10/15   Debbrah Alar, PA-C    Family History No family history on file.  Social History Social History  Substance Use Topics  . Smoking status: Former Research scientist (life sciences)  . Smokeless  tobacco: Former Systems developer  . Alcohol use No     Allergies   Review of patient's allergies indicates no known allergies.   Review of Systems Review of Systems  All systems reviewed and negative, other than as noted in HPI.  Physical Exam Updated Vital Signs BP 112/57 (BP Location: Left Arm)   Pulse 86   Temp 97.9 F (36.6 C) (Oral)   Resp 18   SpO2 96%   Physical Exam  Constitutional: He appears well-developed and well-nourished.  HENT:  Head: Normocephalic and atraumatic.  Eyes: Conjunctivae are normal.  Neck: Neck supple.  Cardiovascular: Normal rate and regular rhythm.   No murmur heard. Pulmonary/Chest: Effort normal and breath sounds normal. No respiratory distress.  Abdominal: Soft. There is no tenderness.  Musculoskeletal: He exhibits no edema.  Neurological: He is alert.  Skin: Skin is warm and dry.  Psychiatric: He has a normal mood and affect.  Nursing note and vitals reviewed.    ED Treatments / Results  Labs (all labs ordered are listed, but only abnormal results are displayed) Labs Reviewed  CBC WITH DIFFERENTIAL/PLATELET - Abnormal; Notable for the following:       Result Value   Hemoglobin 12.8 (*)    HCT 38.2 (*)    All other components within normal limits  BASIC METABOLIC PANEL - Abnormal; Notable for the following:    Sodium 131 (*)  Chloride 97 (*)    Glucose, Bld 427 (*)    BUN 23 (*)    Creatinine, Ser 1.45 (*)    GFR calc non Af Amer 55 (*)    All other components within normal limits  HEMOGLOBIN A1C - Abnormal; Notable for the following:    Hgb A1c MFr Bld 10.7 (*)    All other components within normal limits  CBG MONITORING, ED - Abnormal; Notable for the following:    Glucose-Capillary 272 (*)    All other components within normal limits  TROPONIN I  TROPONIN I    EKG  EKG Interpretation  Date/Time:  Wednesday May 06 2016 13:13:58 EDT Ventricular Rate:  80 PR Interval:  142 QRS Duration: 98 QT Interval:  376 QTC  Calculation: 433 R Axis:   -5 Text Interpretation:  Normal sinus rhythm Normal ECG No significant change since last tracing Confirmed by [name]  MD, [name] ([id number]) on 05/06/2016 3:37:59 PM       Radiology Dg Chest 2 View  Result Date: 05/06/2016 CLINICAL DATA:  Central chest pain radiating to the back for 3-4 days. Nausea and diaphoresis. EXAM: CHEST  2 VIEW COMPARISON:  CT chest 05/04/2016. FINDINGS: Trachea is midline. Heart size normal. Lungs are clear. No pleural fluid. Mild anterior wedging of a lower thoracic vertebral body, unchanged. IMPRESSION: No acute findings. Electronically Signed   By: [name] M.D.   On: 05/06/2016 13:31    Procedures Procedures (including critical care time)  Medications Ordered in ED Medications  sodium chloride 0.9 % bolus 2,000 mL (not administered)     Initial Impression / Assessment and Plan / ED Course  I have reviewed the triage vital signs and the nursing notes.  Pertinent labs & imaging results that were available during my care of the patient were reviewed by me and considered in my medical decision making (see chart for details).  Clinical Course    49yM with CP. Doubt ACS. Somewhat atypical symptoms. EKG not acutely changed from prior. Doubt PE, dissection, or other emergent process. Will repeat troponin. Initial normal. CXR clear. Incidentally noted hyperglycemia. I doubt directly contributory to presenting symptom but needs further addressed. Glucose in 300s during admission for nephrectomy in February but no formal diagnosis. No acidosis or increased anion gap. Suspect it has been creeping up for a while now. Will place IV and reassess after some fluid. Really no emergent need to correct though. Will check A1C for follow-up purposes. May start on metformin at DC.   Final Clinical Impressions(s) / ED Diagnoses   Final diagnoses:  Chest pain, unspecified chest pain type  Hyperglycemia    New Prescriptions New Prescriptions    No medications on file     Virgel Manifold, MD 05/16/16 1627

## 2016-05-06 NOTE — ED Triage Notes (Signed)
Pt reports has been having intermittent chest pain in center of chest that radiates through to his back between his shoulders.  Pt says the pain never got above a 5.  THis morning pt says he felt a little nauseated.

## 2016-05-07 DIAGNOSIS — Z6835 Body mass index (BMI) 35.0-35.9, adult: Secondary | ICD-10-CM | POA: Insufficient documentation

## 2016-05-07 LAB — HEMOGLOBIN A1C
Hgb A1c MFr Bld: 10.7 % — ABNORMAL HIGH (ref 4.8–5.6)
Mean Plasma Glucose: 260 mg/dL

## 2016-05-12 ENCOUNTER — Encounter (INDEPENDENT_AMBULATORY_CARE_PROVIDER_SITE_OTHER): Payer: Self-pay | Admitting: *Deleted

## 2016-05-18 ENCOUNTER — Other Ambulatory Visit (INDEPENDENT_AMBULATORY_CARE_PROVIDER_SITE_OTHER): Payer: Self-pay | Admitting: *Deleted

## 2016-05-18 DIAGNOSIS — Z86010 Personal history of colonic polyps: Secondary | ICD-10-CM | POA: Insufficient documentation

## 2016-06-12 ENCOUNTER — Ambulatory Visit (INDEPENDENT_AMBULATORY_CARE_PROVIDER_SITE_OTHER): Payer: Commercial Managed Care - HMO | Admitting: Cardiovascular Disease

## 2016-06-12 ENCOUNTER — Encounter: Payer: Self-pay | Admitting: *Deleted

## 2016-06-12 ENCOUNTER — Encounter: Payer: Self-pay | Admitting: Cardiovascular Disease

## 2016-06-12 VITALS — BP 104/62 | HR 65 | Ht 67.0 in | Wt 218.0 lb

## 2016-06-12 DIAGNOSIS — I10 Essential (primary) hypertension: Secondary | ICD-10-CM | POA: Diagnosis not present

## 2016-06-12 DIAGNOSIS — R079 Chest pain, unspecified: Secondary | ICD-10-CM | POA: Diagnosis not present

## 2016-06-12 DIAGNOSIS — E7800 Pure hypercholesterolemia, unspecified: Secondary | ICD-10-CM

## 2016-06-12 NOTE — Patient Instructions (Addendum)
Your physician recommends that you schedule a follow-up appointment in:2 MONTHS with Dr. Bronson Ing  Your physician recommends that you continue on your current medications as directed. Please refer to the Current Medication list given to you today.  Your physician has requested that you have en exercise stress myoview. For further information please visit [email or web address]. Please follow instruction sheet, as given.   Thank you for choosing [facility]!!

## 2016-06-12 NOTE — Progress Notes (Signed)
CARDIOLOGY CONSULT NOTE  Patient ID: Douglas Robertson. MRN: [id number] DOB/AGE: April 20, 1966 50 y.o.  Admit date: (Not on file) Primary Physician: Gar Ponto, MD Referring Physician:   Reason for Consultation: chest pain  HPI: 50 year old male with history of hypertension and diabetes referred for evaluation of chest pain. Evaluated in the ED for chest pain on 05/06/16. Symptoms were deemed atypical by emergency room physician. Chest x-ray was clear and troponins were normal. HbA1c elevated at 10.7%. Creatinine 1.45. ECG showed NSR.  He is here with his wife and 2 daughters. His wife is also my patient.  Since being evaluated in the ED, he has not had any further chest pain. He had no symptoms of chest pain prior to this. He denies a history of fatigue and exertional dyspnea. He denies palpitations, orthopnea, leg swelling, and paroxysmal nocturnal dyspnea. He has a history of kidney cancer and is status post left nephrectomy. He has a prior history of tobacco abuse. He takes Lipitor for hyperlipidemia. He was recently started on metformin.  Fam: Father had MI at 58, several uncles and aunts also with CAD.   No Known Allergies  Current Outpatient Prescriptions  Medication Sig Dispense Refill  . atorvastatin (LIPITOR) 20 MG tablet Take 20 mg by mouth daily.    Marland Kitchen BENICAR HCT 40-12.5 MG per tablet Take 1 tablet by mouth daily.     . metFORMIN (GLUCOPHAGE) 500 MG tablet Take 1 tablet (500 mg total) by mouth 2 (two) times daily with a meal. 30 tablet 0   No current facility-administered medications for this visit.     Past Medical History:  Diagnosis Date  . Arthritis    lower back   . Cancer ([place])    kidney (L)  . Chronic kidney disease 2017   left renal mass  . Hypertension     Past Surgical History:  Procedure Laterality Date  . KNEE ARTHROSCOPY  1998    right   . LAPAROSCOPIC NEPHRECTOMY Left 10/10/2015   Procedure: LAPAROSCOPIC RADICAL NEPHRECTOMY;  Surgeon:  Raynelle Bring, MD;  Location: [facility];  Service: Urology;  Laterality: Left;    Social History   Social History  . Marital status: Married    Spouse name: N/A  . Number of children: N/A  . Years of education: N/A   Occupational History  . Not on file.   Social History Main Topics  . Smoking status: Former Research scientist (life sciences)  . Smokeless tobacco: Former Systems developer  . Alcohol use No  . Drug use: No  . Sexual activity: Not on file   Other Topics Concern  . Not on file   Social History Narrative  . No narrative on file       Prior to Admission medications   Medication Sig Start Date End Date Taking? Authorizing Provider  BENICAR HCT 40-12.5 MG per tablet Take 1 tablet by mouth daily.  03/06/14   Historical Provider, MD  metFORMIN (GLUCOPHAGE) 500 MG tablet Take 1 tablet (500 mg total) by mouth 2 (two) times daily with a meal. 05/06/16   Virgel Manifold, MD  Omega-3 Fatty Acids (FISH OIL) 1000 MG CAPS Take 1 capsule by mouth every morning.    Historical Provider, MD  Red Yeast Rice Extract (RED YEAST RICE PO) Take 1 capsule by mouth every morning.    Historical Provider, MD     Review of systems complete and found to be negative unless listed above in HPI  Physical exam Blood pressure 104/62, pulse 65, height 5\' 7"  (1.702 m), weight 218 lb (98.9 kg), SpO2 97 %. General: NAD Neck: No JVD, no thyromegaly or thyroid nodule.  Lungs: Clear to auscultation bilaterally with normal respiratory effort. CV: Nondisplaced PMI. Regular rate and rhythm, normal S1/S2, no S3/S4, no murmur.  No peripheral edema.  No carotid bruit.   Abdomen: Obese, no distention.  Skin: Intact without lesions or rashes.  Neurologic: Alert and oriented x 3.  Psych: Normal affect. Extremities: No clubbing or cyanosis.  HEENT: Normal.   ECG: Most recent ECG reviewed.  Labs:   Lab Results  Component Value Date   WBC 5.8 05/06/2016   HGB 12.8 (L) 05/06/2016   HCT 38.2 (L) 05/06/2016   MCV 82.5 05/06/2016   PLT  158 05/06/2016   No results for input(s): NA, K, CL, CO2, BUN, CREATININE, CALCIUM, PROT, BILITOT, ALKPHOS, ALT, AST, GLUCOSE in the last 168 hours.  Invalid input(s): LABALBU Lab Results  Component Value Date   CKTOTAL 62 09/21/2011   CKMB 2.1 09/21/2011   TROPONINI <0.03 05/06/2016   No results found for: CHOL No results found for: HDL No results found for: LDLCALC No results found for: TRIG No results found for: CHOLHDL No results found for: LDLDIRECT       Studies: No results found.  ASSESSMENT AND PLAN:  1. Chest pain: Multiple CV risk factors with uncontrolled diabetes and family h/o premature CAD included. I will proceed with a nuclear myocardial perfusion imaging study (exercise Myoview) to evaluate for ischemic heart disease. Offered to start ASA 81 mg daily but he declined.  2. HTN: Controlled. No changes.  3. Hyperlipidemia: Continue Lipitor.  Dispo: fu 2 months.   Signed: Kate Sable, M.D., F.A.C.C.  06/12/2016, 9:24 AM

## 2016-07-06 ENCOUNTER — Encounter (HOSPITAL_COMMUNITY)
Admission: RE | Admit: 2016-07-06 | Discharge: 2016-07-06 | Disposition: A | Discharge status: Home / Self Care | Payer: Commercial Managed Care - HMO | Source: Ambulatory Visit | Attending: Cardiovascular Disease | Admitting: Cardiovascular Disease

## 2016-07-06 ENCOUNTER — Encounter (HOSPITAL_COMMUNITY): Payer: Self-pay

## 2016-07-06 ENCOUNTER — Inpatient Hospital Stay (HOSPITAL_COMMUNITY): Admission: RE | Admit: 2016-07-06 | Payer: Commercial Managed Care - HMO | Source: Ambulatory Visit

## 2016-07-06 DIAGNOSIS — R079 Chest pain, unspecified: Secondary | ICD-10-CM | POA: Diagnosis not present

## 2016-07-06 LAB — NM MYOCAR MULTI W/SPECT W/WALL MOTION / EF
CHL CUP RESTING HR STRESS: 58 {beats}/min
CHL RATE OF PERCEIVED EXERTION: 12
CSEPEDS: 27 s
CSEPPHR: 157 {beats}/min
Estimated workload: 11.5 METS
Exercise duration (min): 9 min
LVDIAVOL: 87 mL (ref 62–150)
LVSYSVOL: 30 mL
MPHR: 170 {beats}/min
Percent HR: 92 %
RATE: 0.35
SDS: 0
SRS: 0
SSS: 0
TID: 0.85

## 2016-07-06 MED ORDER — TECHNETIUM TC 99M TETROFOSMIN IV KIT
30.0000 | PACK | Freq: Once | INTRAVENOUS | Status: AC | PRN
  Administered 2016-07-06: 28.4 via INTRAVENOUS

## 2016-07-06 MED ORDER — REGADENOSON 0.4 MG/5ML IV SOLN
INTRAVENOUS | Status: AC
  Filled 2016-07-06: qty 5

## 2016-07-06 MED ORDER — SODIUM CHLORIDE 0.9% FLUSH
INTRAVENOUS | Status: AC
  Administered 2016-07-06: 10 mL via INTRAVENOUS
  Filled 2016-07-06: qty 10

## 2016-07-06 MED ORDER — TECHNETIUM TC 99M TETROFOSMIN IV KIT
10.0000 | PACK | Freq: Once | INTRAVENOUS | Status: AC | PRN
Start: 2016-07-06 — End: 2016-07-06
  Administered 2016-07-06: 11 via INTRAVENOUS

## 2016-07-08 ENCOUNTER — Telehealth: Payer: Self-pay | Admitting: *Deleted

## 2016-07-08 NOTE — Telephone Encounter (Signed)
Notes Recorded by Laurine Blazer, LPN on [id number] at D34-534 PM EDT Left message to return call.  ------  Notes Recorded by Arnoldo Lenis, MD on 07/07/2016 at 11:29 AM EDT Stress test looks good, no evidence of any blockages. Dr Raliegh Ip to discuss further at f/u

## 2016-07-08 NOTE — Telephone Encounter (Signed)
Douglas Robertson returned call to [facility].

## 2016-07-09 NOTE — Telephone Encounter (Signed)
Returned call

## 2016-07-09 NOTE — Telephone Encounter (Signed)
Pt made aware

## 2016-07-10 ENCOUNTER — Telehealth (INDEPENDENT_AMBULATORY_CARE_PROVIDER_SITE_OTHER): Payer: Self-pay | Admitting: *Deleted

## 2016-07-10 ENCOUNTER — Encounter (INDEPENDENT_AMBULATORY_CARE_PROVIDER_SITE_OTHER): Payer: Self-pay | Admitting: *Deleted

## 2016-07-10 NOTE — Telephone Encounter (Signed)
Patient needs trilyte 

## 2016-07-13 MED ORDER — PEG 3350-KCL-NA BICARB-NACL 420 G PO SOLR: 4000.0000 mL | Freq: Once | ORAL | 0 refills | Status: AC

## 2016-08-03 ENCOUNTER — Telehealth (INDEPENDENT_AMBULATORY_CARE_PROVIDER_SITE_OTHER): Payer: Self-pay | Admitting: *Deleted

## 2016-08-03 NOTE — Telephone Encounter (Signed)
Referring MD/PCP: daniel   Procedure: tcs  Reason/Indication:  Hx polyps  Has patient had this procedure before?  Yes, 2012 -- epic  If so, when, by whom and where?    Is there a family history of colon cancer?  no  Who?  What age when diagnosed?    Is patient diabetic?   yes      Does patient have prosthetic heart valve or mechanical valve?  no  Do you have a pacemaker?  no  Has patient ever had endocarditis? no  Has patient had joint replacement within last 12 months?  no  Does patient tend to be constipated or take laxatives? no  Does patient have a history of alcohol/drug use?  no  Is patient on Coumadin, Plavix and/or Aspirin? no  Medications: benicar 40/12.5 mg daily, metformin 500 mg bid, atorvastatin 20 mg daily  Allergies: nkda  Medication Adjustment:   Procedure date & time: 09/03/16 at 730

## 2016-08-04 NOTE — Telephone Encounter (Signed)
agree

## 2016-08-19 ENCOUNTER — Ambulatory Visit (INDEPENDENT_AMBULATORY_CARE_PROVIDER_SITE_OTHER): Payer: Commercial Managed Care - HMO | Admitting: Cardiovascular Disease

## 2016-08-19 ENCOUNTER — Encounter: Payer: Self-pay | Admitting: Cardiovascular Disease

## 2016-08-19 VITALS — BP 112/70 | HR 58 | Ht 67.0 in | Wt 220.0 lb

## 2016-08-19 DIAGNOSIS — R079 Chest pain, unspecified: Secondary | ICD-10-CM

## 2016-08-19 DIAGNOSIS — Z136 Encounter for screening for cardiovascular disorders: Secondary | ICD-10-CM | POA: Diagnosis not present

## 2016-08-19 DIAGNOSIS — Z7182 Exercise counseling: Secondary | ICD-10-CM

## 2016-08-19 DIAGNOSIS — I10 Essential (primary) hypertension: Secondary | ICD-10-CM | POA: Diagnosis not present

## 2016-08-19 DIAGNOSIS — E7800 Pure hypercholesterolemia, unspecified: Secondary | ICD-10-CM | POA: Diagnosis not present

## 2016-08-19 DIAGNOSIS — IMO0001 Reserved for inherently not codable concepts without codable children: Secondary | ICD-10-CM

## 2016-08-19 NOTE — Patient Instructions (Signed)
Medication Instructions:  Continue all current medications.  Labwork: none  Testing/Procedures: none  Follow-Up: As needed.    Any Other Special Instructions Will Be Listed Below (If Applicable).  If you need a refill on your cardiac medications before your next appointment, please call your pharmacy.  

## 2016-08-19 NOTE — Progress Notes (Signed)
      SUBJECTIVE: The patient returns for follow-up after undergoing cardiovascular testing performed for the evaluation of chest pain.  Normal nuclear stress test 07/06/16, LVEF greater than 65%.  He has not had any further episodes of chest pain. He is trying to walk more. He has also been watching his diet and has lost 15 pounds.  His wife and 2 daughters are nurses.   Review of Systems: As per "subjective", otherwise negative.  No Known Allergies  Current Outpatient Prescriptions  Medication Sig Dispense Refill  . atorvastatin (LIPITOR) 20 MG tablet Take 20 mg by mouth daily.    Marland Kitchen BENICAR HCT 40-12.5 MG per tablet Take 1 tablet by mouth daily.     . metFORMIN (GLUCOPHAGE) 500 MG tablet Take 1 tablet (500 mg total) by mouth 2 (two) times daily with a meal. 30 tablet 0   No current facility-administered medications for this visit.     Past Medical History:  Diagnosis Date  . Arthritis    lower back   . Cancer ([place])    kidney (L)  . Chronic kidney disease 2017   left renal mass  . Hypertension     Past Surgical History:  Procedure Laterality Date  . KNEE ARTHROSCOPY  1998    right   . LAPAROSCOPIC NEPHRECTOMY Left 10/10/2015   Procedure: LAPAROSCOPIC RADICAL NEPHRECTOMY;  Surgeon: Raynelle Bring, MD;  Location: [facility];  Service: Urology;  Laterality: Left;    Social History   Social History  . Marital status: Married    Spouse name: N/A  . Number of children: N/A  . Years of education: N/A   Occupational History  . Not on file.   Social History Main Topics  . Smoking status: Former Research scientist (life sciences)  . Smokeless tobacco: Former Systems developer  . Alcohol use No  . Drug use: No  . Sexual activity: Not on file   Other Topics Concern  . Not on file   Social History Narrative  . No narrative on file     Vitals:   08/19/16 0950  BP: 112/70  Pulse: (!) 58  SpO2: 98%  Weight: 220 lb (99.8 kg)  Height: 5\' 7"  (1.702 m)    PHYSICAL EXAM General: NAD HEENT:  Normal. Neck: No JVD, no thyromegaly. Lungs: Clear to auscultation bilaterally with normal respiratory effort. CV: Nondisplaced PMI.  Regular rate and rhythm, normal S1/S2, no S3/S4, no murmur. No pretibial or periankle edema.   Abdomen: Soft, obese. Neurologic: Alert and oriented.  Psych: Normal affect. Skin: Normal. Musculoskeletal: No gross deformities.    ECG: Most recent ECG reviewed.      ASSESSMENT AND PLAN: 1. Chest pain: Multiple CV risk factors with uncontrolled diabetes and family h/o premature CAD included. However, nuclear myocardial perfusion imaging study was normal. No further testing indicated. Offered to start ASA 81 mg daily again but he declined. May have microvascular angina. If chest pain recurs, I would try Ranexa 500 mg bid.  2. Hypertension: Controlled. No changes.  3. Hyperlipidemia: Continue Lipitor.  4. Exercise counseling provided.  Dispo: fu prn   Kate Sable, M.D., F.A.C.C.

## 2016-09-03 ENCOUNTER — Ambulatory Visit (HOSPITAL_COMMUNITY)
Admission: RE | Admit: 2016-09-03 | Discharge: 2016-09-03 | Disposition: A | Discharge status: Home / Self Care | Payer: Commercial Managed Care - HMO | Source: Ambulatory Visit | Attending: Internal Medicine | Admitting: Internal Medicine

## 2016-09-03 ENCOUNTER — Encounter (HOSPITAL_COMMUNITY): Payer: Self-pay | Admitting: *Deleted

## 2016-09-03 ENCOUNTER — Encounter (HOSPITAL_COMMUNITY)
Admission: RE | Disposition: A | Discharge status: Home / Self Care | Payer: Self-pay | Source: Ambulatory Visit | Attending: Internal Medicine

## 2016-09-03 DIAGNOSIS — K5730 Diverticulosis of large intestine without perforation or abscess without bleeding: Secondary | ICD-10-CM | POA: Insufficient documentation

## 2016-09-03 DIAGNOSIS — Z7984 Long term (current) use of oral hypoglycemic drugs: Secondary | ICD-10-CM | POA: Insufficient documentation

## 2016-09-03 DIAGNOSIS — Z87891 Personal history of nicotine dependence: Secondary | ICD-10-CM | POA: Diagnosis not present

## 2016-09-03 DIAGNOSIS — Z1211 Encounter for screening for malignant neoplasm of colon: Secondary | ICD-10-CM | POA: Insufficient documentation

## 2016-09-03 DIAGNOSIS — I10 Essential (primary) hypertension: Secondary | ICD-10-CM | POA: Diagnosis not present

## 2016-09-03 DIAGNOSIS — Z85528 Personal history of other malignant neoplasm of kidney: Secondary | ICD-10-CM | POA: Insufficient documentation

## 2016-09-03 DIAGNOSIS — Z860100 Personal history of colon polyps, unspecified: Secondary | ICD-10-CM | POA: Insufficient documentation

## 2016-09-03 DIAGNOSIS — D125 Benign neoplasm of sigmoid colon: Secondary | ICD-10-CM | POA: Insufficient documentation

## 2016-09-03 DIAGNOSIS — Z09 Encounter for follow-up examination after completed treatment for conditions other than malignant neoplasm: Secondary | ICD-10-CM | POA: Diagnosis not present

## 2016-09-03 DIAGNOSIS — K644 Residual hemorrhoidal skin tags: Secondary | ICD-10-CM

## 2016-09-03 DIAGNOSIS — E119 Type 2 diabetes mellitus without complications: Secondary | ICD-10-CM | POA: Diagnosis not present

## 2016-09-03 DIAGNOSIS — K626 Ulcer of anus and rectum: Secondary | ICD-10-CM

## 2016-09-03 HISTORY — PX: COLONOSCOPY: SHX5424

## 2016-09-03 HISTORY — DX: Type 2 diabetes mellitus without complications: E11.9

## 2016-09-03 LAB — GLUCOSE, CAPILLARY: GLUCOSE-CAPILLARY: 141 mg/dL — AB (ref 65–99)

## 2016-09-03 SURGERY — COLONOSCOPY
Anesthesia: Moderate Sedation

## 2016-09-03 MED ORDER — SODIUM CHLORIDE 0.9 % IV SOLN
INTRAVENOUS | Status: DC
  Administered 2016-09-03: 07:00:00 via INTRAVENOUS

## 2016-09-03 MED ORDER — MEPERIDINE HCL 50 MG/ML IJ SOLN
INTRAMUSCULAR | Status: DC | PRN
  Administered 2016-09-03 (×2): 25 mg via INTRAVENOUS

## 2016-09-03 MED ORDER — MIDAZOLAM HCL 5 MG/5ML IJ SOLN
INTRAMUSCULAR | Status: AC
  Filled 2016-09-03: qty 10

## 2016-09-03 MED ORDER — MEPERIDINE HCL 50 MG/ML IJ SOLN
INTRAMUSCULAR | Status: AC
  Filled 2016-09-03: qty 1

## 2016-09-03 MED ORDER — MIDAZOLAM HCL 5 MG/5ML IJ SOLN
INTRAMUSCULAR | Status: DC | PRN
  Administered 2016-09-03: 2 mg via INTRAVENOUS
  Administered 2016-09-03: 1 mg via INTRAVENOUS
  Administered 2016-09-03: 2 mg via INTRAVENOUS

## 2016-09-03 NOTE — Discharge Instructions (Signed)
Resume usual medications and high fiber diet. No driving for 24 hours. Physician will call with biopsy results.   Colonoscopy, Adult, Care After This sheet gives you information about how to care for yourself after your procedure. Your health care provider may also give you more specific instructions. If you have problems or questions, contact your health care provider. What can I expect after the procedure? After the procedure, it is common to have:  A small amount of blood in your stool for 24 hours after the procedure.  Some gas.  Mild abdominal cramping or bloating. Follow these instructions at home: General instructions  For the first 24 hours after the procedure:  Do not drive or use machinery.  Do not sign important documents.  Do not drink alcohol.  Do your regular daily activities at a slower pace than normal.  Eat soft, easy-to-digest foods.  Rest often.  Take over-the-counter or prescription medicines only as told by your health care provider.  It is up to you to get the results of your procedure. Ask your health care provider, or the department performing the procedure, when your results will be ready. Relieving cramping and bloating  Try walking around when you have cramps or feel bloated. Eating and drinking  Drink enough fluid to keep your urine clear or pale yellow.  Resume your normal diet as instructed by your health care provider. Avoid heavy or fried foods that are hard to digest.  Avoid drinking alcohol for as long as instructed by your health care provider. Contact a health care provider if:  You have blood in your stool 2-3 days after the procedure. Get help right away if:  You have more than a small spotting of blood in your stool.  You pass large blood clots in your stool.  Your abdomen is swollen.  You have nausea or vomiting.  You have a fever.  You have increasing abdominal pain that is not relieved with medicine. This  information is not intended to replace advice given to you by your health care provider. Make sure you discuss any questions you have with your health care provider. Document Released: 04/07/2004 Document Revised: 05/18/2016 Document Reviewed: 11/05/2015 Elsevier Interactive Patient Education  2017 [employer].

## 2016-09-03 NOTE — Op Note (Signed)
[facility] Patient Name: Douglas Robertson Procedure Date: 09/03/2016 7:09 AM MRN: [id number] Date of Birth: 10-Apr-1966 Attending MD: Hildred Laser , MD CSN: [id number] Age: 50 Admit Type: Outpatient Procedure:                Colonoscopy Indications:              High risk colon cancer surveillance: Personal                            history of colonic polyps Providers:                Hildred Laser, MD, Rosina Lowenstein, RN, Aram Candela Referring MD:             Mitzie Na. Quillian Quince, MD Medicines:                Meperidine 50 mg IV, Midazolam 5 mg IV Complications:            No immediate complications. Estimated Blood Loss:     Estimated blood loss was minimal. Procedure:                Pre-Anesthesia Assessment:                           - Prior to the procedure, a History and Physical                            was performed, and patient medications and                            allergies were reviewed. The patient's tolerance of                            previous anesthesia was also reviewed. The risks                            and benefits of the procedure and the sedation                            options and risks were discussed with the patient.                            All questions were answered, and informed consent                            was obtained. Prior Anticoagulants: The patient has                            taken no previous anticoagulant or antiplatelet                            agents. ASA Grade Assessment: II - A patient with                            mild systemic disease. After reviewing the risks  and benefits, the patient was deemed in                            satisfactory condition to undergo the procedure.                           After obtaining informed consent, the colonoscope                            was passed under direct vision. Throughout the                            procedure, the patient's blood pressure,  pulse, and                            oxygen saturations were monitored continuously. The                            EC-[id number]) scope was introduced through                            the anus and advanced to the the cecum, identified                            by appendiceal orifice and ileocecal valve. The                            colonoscopy was performed without difficulty. The                            patient tolerated the procedure well. The quality                            of the bowel preparation was excellent. The                            ileocecal valve, appendiceal orifice, and rectum                            were photographed. Scope In: 7:42:50 AM Scope Out: 8:04:15 AM Scope Withdrawal Time: 0 hours 17 minutes 24 seconds  Total Procedure Duration: 0 hours 21 minutes 25 seconds  Findings:      The perianal and digital rectal examinations were normal.      A 5 mm polyp was found in the sigmoid colon. The polyp was sessile.       Biopsies were taken with a cold forceps for histology. The pathology       specimen was placed into Bottle Number 1.      A few small-mouthed diverticula were found in the sigmoid colon.      A single non-bleeding erosion was found in the distal rectum. Biopsies       were taken with a cold forceps for histology. The pathology specimen was       placed into Bottle Number 2.      External hemorrhoids were found  during retroflexion. The hemorrhoids       were medium-sized. Impression:               - One 5 mm polyp in the sigmoid colon. Biopsied.                           - Diverticulosis in the sigmoid colon.                           - A single erosion in the distal rectum. Biopsied.                           - External hemorrhoids. Moderate Sedation:      Moderate (conscious) sedation was administered by the endoscopy nurse       and supervised by the endoscopist. The following parameters were       monitored: oxygen saturation,  heart rate, blood pressure, CO2       capnography and response to care. Total physician intraservice time was       27 minutes. Recommendation:           - Patient has a contact number available for                            emergencies. The signs and symptoms of potential                            delayed complications were discussed with the                            patient. Return to normal activities tomorrow.                            Written discharge instructions were provided to the                            patient.                           - High fiber diet today.                           - Continue present medications.                           - Await pathology results.                           - Repeat colonoscopy for surveillance based on                            pathology results. Procedure Code(s):        --- Professional ---                           [phone], Colonoscopy, flexible; with biopsy, single  or multiple                           [id number], Moderate sedation services provided by the                            same physician or other qualified health care                            professional performing the diagnostic or                            therapeutic service that the sedation supports,                            requiring the presence of an independent trained                            observer to assist in the monitoring of the                            patient's level of consciousness and physiological                            status; initial 15 minutes of intraservice time,                            patient age 72 years or older                           [phone], Moderate sedation services; each additional                            15 minutes intraservice time Diagnosis Code(s):        --- Professional ---                           Z86.010, Personal history of colonic polyps                           D12.5, Benign  neoplasm of sigmoid colon                           K64.4, Residual hemorrhoidal skin tags                           K62.6, Ulcer of anus and rectum                           K57.30, Diverticulosis of large intestine without                            perforation or abscess without bleeding CPT copyright 2016 [employer]. All rights reserved. The codes documented in this report are preliminary and upon coder review may  be revised to meet current compliance requirements.  Hildred Laser, MD Hildred Laser, MD 09/03/2016 8:13:56 AM This report has been signed electronically. Number of Addenda: 0

## 2016-09-03 NOTE — H&P (Signed)
Douglas Robertson. is an 50 y.o. male.   Chief Complaint: Patient is here for colonoscopy. HPI: Patient is 50 year old Caucasian male who was history of colonic adenoma and is here for surveillance colonoscopy. He denies abdominal pain change in bowel habits or rectal bleeding. Last colonoscopy was in February 2012 for chronic diarrhea and he was found have 7 mm tubular adenoma. Family history is negative for CRC.  Past Medical History:  Diagnosis Date  . Cancer ([place])    kidney (L)  . Chronic kidney disease 2017   left renal mass  . Diabetes mellitus without complication ([place])   . Hypertension     Past Surgical History:  Procedure Laterality Date  . COLONOSCOPY  2012  . KNEE ARTHROSCOPY  1998    right   . LAPAROSCOPIC NEPHRECTOMY Left 10/10/2015   Procedure: LAPAROSCOPIC RADICAL NEPHRECTOMY;  Surgeon: Raynelle Bring, MD;  Location: [facility];  Service: Urology;  Laterality: Left;    Family History  Problem Relation Age of Onset  . Colon cancer Neg Hx    Social History:  reports that he has quit smoking. He has quit using smokeless tobacco. He reports that he does not drink alcohol or use drugs.  Allergies: No Known Allergies  Medications Prior to Admission  Medication Sig Dispense Refill  . atorvastatin (LIPITOR) 20 MG tablet Take 20 mg by mouth daily.    Marland Kitchen BENICAR HCT 40-12.5 MG per tablet Take 1 tablet by mouth daily.     . metFORMIN (GLUCOPHAGE) 500 MG tablet Take 1 tablet (500 mg total) by mouth 2 (two) times daily with a meal. (Patient taking differently: Take 1,000 mg by mouth daily. ) 30 tablet 0    Results for orders placed or performed during the hospital encounter of 09/03/16 (from the past 48 hour(s))  Glucose, capillary     Status: Abnormal   Collection Time: 09/03/16  6:52 AM  Result Value Ref Range   Glucose-Capillary 141 (H) 65 - 99 mg/dL   No results found.  ROS  Blood pressure 90/72, pulse (!) 59, temperature 97.6 F (36.4 C), temperature source Oral,  resp. rate 20, height 5\' 7"  (1.702 m), weight 205 lb (93 kg), SpO2 96 %. Physical Exam  Constitutional: He appears well-developed and well-nourished.  HENT:  Mouth/Throat: Oropharynx is clear and moist.  Eyes: Conjunctivae are normal. No scleral icterus.  Neck: No thyromegaly present.  Cardiovascular: Normal rate, regular rhythm and normal heart sounds.   No murmur heard. Respiratory: Effort normal and breath sounds normal.  GI: Soft. He exhibits no distension and no mass. There is no tenderness.  Musculoskeletal: He exhibits no edema.  Neurological: He is alert.  Skin: Skin is warm and dry.     Assessment/Plan History of colonic adenoma. Surveillance colonoscopy.  Hildred Laser, MD 09/03/2016, 7:33 AM

## 2016-09-08 ENCOUNTER — Encounter (HOSPITAL_COMMUNITY): Payer: Self-pay | Admitting: Internal Medicine

## 2016-09-17 DIAGNOSIS — I10 Essential (primary) hypertension: Secondary | ICD-10-CM | POA: Diagnosis not present

## 2016-09-17 DIAGNOSIS — E1165 Type 2 diabetes mellitus with hyperglycemia: Secondary | ICD-10-CM | POA: Diagnosis not present

## 2016-09-17 DIAGNOSIS — E782 Mixed hyperlipidemia: Secondary | ICD-10-CM | POA: Diagnosis not present

## 2016-09-21 DIAGNOSIS — E1165 Type 2 diabetes mellitus with hyperglycemia: Secondary | ICD-10-CM | POA: Diagnosis not present

## 2016-09-21 DIAGNOSIS — I10 Essential (primary) hypertension: Secondary | ICD-10-CM | POA: Diagnosis not present

## 2016-09-21 DIAGNOSIS — E782 Mixed hyperlipidemia: Secondary | ICD-10-CM | POA: Diagnosis not present

## 2016-09-21 DIAGNOSIS — Z23 Encounter for immunization: Secondary | ICD-10-CM | POA: Diagnosis not present

## 2016-10-15 ENCOUNTER — Encounter: Payer: Self-pay | Admitting: Podiatry

## 2016-10-15 ENCOUNTER — Ambulatory Visit: Payer: Commercial Managed Care - HMO

## 2016-10-15 ENCOUNTER — Ambulatory Visit (INDEPENDENT_AMBULATORY_CARE_PROVIDER_SITE_OTHER): Payer: Commercial Managed Care - HMO | Admitting: Podiatry

## 2016-10-15 DIAGNOSIS — M7672 Peroneal tendinitis, left leg: Secondary | ICD-10-CM

## 2016-10-15 DIAGNOSIS — M722 Plantar fascial fibromatosis: Secondary | ICD-10-CM

## 2016-10-18 NOTE — Progress Notes (Signed)
Presents today for follow-up of plantar fasciitis bilateral left greater than right he states that he started hurting again about 2-3 weeks ago requesting a orthotics.  Objective: Vital signs are stable alert and oriented 3. Pulses are palpable. As pain on palpation medial calcaneal tubercles bilateral foot left greater than right.  Assessment: Pain and limp secondary to onychomycosis.  Plan: Injected the left heel today with Kenalog and local anesthetic and he was scanned for a new set of orthotics.

## 2016-10-21 DIAGNOSIS — C642 Malignant neoplasm of left kidney, except renal pelvis: Secondary | ICD-10-CM | POA: Diagnosis not present

## 2016-10-23 DIAGNOSIS — C642 Malignant neoplasm of left kidney, except renal pelvis: Secondary | ICD-10-CM | POA: Diagnosis not present

## 2016-10-26 DIAGNOSIS — L03316 Cellulitis of umbilicus: Secondary | ICD-10-CM | POA: Diagnosis not present

## 2016-10-30 DIAGNOSIS — Z85528 Personal history of other malignant neoplasm of kidney: Secondary | ICD-10-CM | POA: Diagnosis not present

## 2016-11-11 ENCOUNTER — Ambulatory Visit (INDEPENDENT_AMBULATORY_CARE_PROVIDER_SITE_OTHER): Payer: Commercial Managed Care - HMO | Admitting: Podiatry

## 2016-11-11 DIAGNOSIS — M7672 Peroneal tendinitis, left leg: Secondary | ICD-10-CM

## 2016-11-11 DIAGNOSIS — M722 Plantar fascial fibromatosis: Secondary | ICD-10-CM | POA: Diagnosis not present

## 2016-11-11 MED ORDER — TRIAMCINOLONE ACETONIDE 10 MG/ML IJ SUSP
10.0000 mg | Freq: Once | INTRAMUSCULAR | Status: AC
  Administered 2016-11-11: 10 mg

## 2016-11-11 NOTE — Patient Instructions (Signed)

## 2016-11-14 NOTE — Progress Notes (Signed)
Subjective:     Patient ID: Douglas Robertson., male   DOB: Apr 18, 1966, 51 y.o.   MRN: [id number]  HPI patient continues to experience discomfort in the left plantar fascia especially after periods of sitting and the morning   Review of Systems     Objective:   Physical Exam Neurovascular status intact negative Homans sign was noted with patient found to have discomfort in the left plantar fascia at the insertional point of the tendon into the calcaneus with fluid buildup around the medial band    Assessment:     Plantar fasciitis of the heel left still present with pain especially after periods of inactivity and morning    Plan:     H&P condition reviewed and injected the plantar fascial left 3 mg Kenalog 5 mg Xylocaine advised on physical therapy shoe gear modifications dispensed orthotics and dispensed night splint with all instructions on usage

## 2016-12-15 ENCOUNTER — Ambulatory Visit (INDEPENDENT_AMBULATORY_CARE_PROVIDER_SITE_OTHER): Payer: Commercial Managed Care - HMO | Admitting: Podiatry

## 2016-12-15 ENCOUNTER — Encounter: Payer: Self-pay | Admitting: Podiatry

## 2016-12-15 ENCOUNTER — Ambulatory Visit: Payer: Commercial Managed Care - HMO | Admitting: Podiatry

## 2016-12-15 DIAGNOSIS — M7672 Peroneal tendinitis, left leg: Secondary | ICD-10-CM | POA: Diagnosis not present

## 2016-12-15 DIAGNOSIS — M722 Plantar fascial fibromatosis: Secondary | ICD-10-CM

## 2016-12-15 DIAGNOSIS — M71571 Other bursitis, not elsewhere classified, right ankle and foot: Secondary | ICD-10-CM | POA: Diagnosis not present

## 2016-12-15 DIAGNOSIS — M7751 Other enthesopathy of right foot: Secondary | ICD-10-CM

## 2016-12-15 NOTE — Progress Notes (Signed)
He presents today stating that his left foot seems to be doing better however his right heel is becoming more painful. We also made him a new set of orthotics.  Objective: Vital signs are stable he is alert and oriented 3. Left foot appears to be doing well. His tennis on palpation of the right heel. There appears to be a small bursa or area of fluctuance beneath the calcaneal tubercle.  Assessment: Well-healed plantar fasciitis left fasciitis or bursitis right foot.  Plan: Injected this area today. His orthotics are not in yet a we will notify him in the near future.

## 2016-12-31 ENCOUNTER — Ambulatory Visit: Payer: Commercial Managed Care - HMO | Admitting: Podiatry

## 2017-01-21 ENCOUNTER — Encounter: Payer: Self-pay | Admitting: Podiatry

## 2017-01-21 ENCOUNTER — Ambulatory Visit (INDEPENDENT_AMBULATORY_CARE_PROVIDER_SITE_OTHER): Payer: Commercial Managed Care - HMO | Admitting: Podiatry

## 2017-01-21 DIAGNOSIS — M722 Plantar fascial fibromatosis: Secondary | ICD-10-CM

## 2017-01-21 NOTE — Patient Instructions (Signed)

## 2017-01-23 NOTE — Progress Notes (Signed)
He presents today states that they feel little bit better but they're still painful. He is also here to pick up his orthotics.  Objective: Vital signs are stable alert and oriented 3. Pulses are palpable. Neurologic sensorium is it affect. He has pain on palpation of medial calcaneal tubercles bilateral.  Assessment: Plantar fasciitis bilateral.  Plan: Reinjection bilateral heels today his request and dispensed his orthotics once again.

## 2017-02-12 DIAGNOSIS — Z0001 Encounter for general adult medical examination with abnormal findings: Secondary | ICD-10-CM | POA: Diagnosis not present

## 2017-02-18 ENCOUNTER — Ambulatory Visit: Payer: Commercial Managed Care - HMO | Admitting: Podiatry

## 2017-02-18 DIAGNOSIS — Z0001 Encounter for general adult medical examination with abnormal findings: Secondary | ICD-10-CM | POA: Diagnosis not present

## 2017-02-23 ENCOUNTER — Ambulatory Visit (INDEPENDENT_AMBULATORY_CARE_PROVIDER_SITE_OTHER): Payer: Commercial Managed Care - HMO

## 2017-02-23 ENCOUNTER — Ambulatory Visit (INDEPENDENT_AMBULATORY_CARE_PROVIDER_SITE_OTHER): Payer: Commercial Managed Care - HMO | Admitting: Podiatry

## 2017-02-23 ENCOUNTER — Encounter: Payer: Self-pay | Admitting: Podiatry

## 2017-02-23 DIAGNOSIS — M76822 Posterior tibial tendinitis, left leg: Secondary | ICD-10-CM

## 2017-02-23 DIAGNOSIS — M722 Plantar fascial fibromatosis: Secondary | ICD-10-CM

## 2017-02-23 NOTE — Progress Notes (Signed)
He presents today for follow-up of his left foot. States that the right foot is doing pretty good but the left heel is a little bit tender and is having some pain in the medial longitudinal arch area.  Objective: Vital signs are stable he's alert and oriented 3. Pulses are palpable. He still has some tennis on palpation mucogingival of the left heel and he also has pain on insertion of the posterior tibial tendon of the navicular tuberosity. Radiographs taken today of the medial foot do demonstrate an os navicularis A was soft tissue in increase in density along the posterior tibial tendon.  Assessment: Tibialis posterior tendinitis plantar fasciitis left.  Plan: Injected dexamethasone to the point of maximal tenderness of the posterior tibial tendon left today did not inject into the tendon back into the tendon sheath. I also injected the plantar fascia left heel. Follow up with him in 6 weeks

## 2017-02-25 ENCOUNTER — Ambulatory Visit: Payer: Commercial Managed Care - HMO | Admitting: Podiatry

## 2017-03-23 ENCOUNTER — Ambulatory Visit: Payer: Commercial Managed Care - HMO | Admitting: Podiatry

## 2017-03-23 DIAGNOSIS — D229 Melanocytic nevi, unspecified: Secondary | ICD-10-CM | POA: Diagnosis not present

## 2017-03-23 DIAGNOSIS — L281 Prurigo nodularis: Secondary | ICD-10-CM | POA: Diagnosis not present

## 2017-03-23 DIAGNOSIS — L570 Actinic keratosis: Secondary | ICD-10-CM | POA: Diagnosis not present

## 2017-03-30 ENCOUNTER — Encounter: Payer: Self-pay | Admitting: Podiatry

## 2017-03-30 ENCOUNTER — Ambulatory Visit (INDEPENDENT_AMBULATORY_CARE_PROVIDER_SITE_OTHER): Payer: 59 | Admitting: Podiatry

## 2017-03-30 DIAGNOSIS — M722 Plantar fascial fibromatosis: Secondary | ICD-10-CM | POA: Diagnosis not present

## 2017-03-30 DIAGNOSIS — M76822 Posterior tibial tendinitis, left leg: Secondary | ICD-10-CM | POA: Diagnosis not present

## 2017-03-31 NOTE — Progress Notes (Signed)
He presents today with continued pain around the plantar fascial calcaneal insertion site as well as the posterior tibial tendon. He also goes on to say that his orthotics feel like they're a little shortened left his toes are having off the end of the orthotic itself. He states that his pain is still a 5 out of 10. He states these which is his boots every day between different manufacturer's style cement.  Objective: Vital signs are stable he is alert and oriented 3. Pulses are palpable. He has tenderness on palpation of the posterior tibial tendon is courses beneath the medial malleolus is extending to the navicular tuberosity he also has pain on palpation of the plantar fascia left heel.  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  Plan: We will send the orthotics back to have been lengthened also I recommended that he stick with one pair of shoes and keep the orthotics in those shoes or boots until the boots or shoes are worn out particularly for his work duties. I think switching back and forth every day between different models of the manufacture ABG stressful for his foot. We talked about physical therapy which she really does not seem to be very interested in and also the possible need for MRI to make sure that there is nothing torn in there. He states that he rather wait until the orthotics come back and he's going to try keeping with one brand shoe/movement.

## 2017-04-08 ENCOUNTER — Telehealth: Payer: Self-pay | Admitting: Podiatry

## 2017-04-08 NOTE — Telephone Encounter (Signed)
Left message for pt to call to schedule an appt with Liliane Channel to pu corrected orthotics.Marland KitchenMarland Kitchen

## 2017-05-03 DIAGNOSIS — Z85528 Personal history of other malignant neoplasm of kidney: Secondary | ICD-10-CM | POA: Diagnosis not present

## 2017-05-04 ENCOUNTER — Ambulatory Visit: Payer: 59 | Admitting: Orthotics

## 2017-05-04 DIAGNOSIS — M722 Plantar fascial fibromatosis: Secondary | ICD-10-CM

## 2017-05-04 NOTE — Progress Notes (Signed)
Patient came in to pick up refinished CMFO.   One full size was added to topcover per Dr. Milinda Pointer.

## 2017-05-07 DIAGNOSIS — Z85528 Personal history of other malignant neoplasm of kidney: Secondary | ICD-10-CM | POA: Diagnosis not present

## 2017-05-07 DIAGNOSIS — C649 Malignant neoplasm of unspecified kidney, except renal pelvis: Secondary | ICD-10-CM | POA: Diagnosis not present

## 2017-05-13 ENCOUNTER — Ambulatory Visit: Payer: 59 | Admitting: Podiatry

## 2017-05-15 DIAGNOSIS — L570 Actinic keratosis: Secondary | ICD-10-CM | POA: Diagnosis not present

## 2017-05-15 DIAGNOSIS — Z23 Encounter for immunization: Secondary | ICD-10-CM | POA: Diagnosis not present

## 2017-06-02 DIAGNOSIS — R918 Other nonspecific abnormal finding of lung field: Secondary | ICD-10-CM | POA: Diagnosis not present

## 2017-06-02 DIAGNOSIS — C642 Malignant neoplasm of left kidney, except renal pelvis: Secondary | ICD-10-CM | POA: Diagnosis not present

## 2017-06-22 DIAGNOSIS — M545 Low back pain: Secondary | ICD-10-CM | POA: Diagnosis not present

## 2017-06-22 DIAGNOSIS — M5416 Radiculopathy, lumbar region: Secondary | ICD-10-CM | POA: Diagnosis not present

## 2017-06-28 DIAGNOSIS — M5416 Radiculopathy, lumbar region: Secondary | ICD-10-CM | POA: Diagnosis not present

## 2017-06-28 DIAGNOSIS — M545 Low back pain: Secondary | ICD-10-CM | POA: Diagnosis not present

## 2017-07-01 DIAGNOSIS — M5416 Radiculopathy, lumbar region: Secondary | ICD-10-CM | POA: Diagnosis not present

## 2017-07-01 DIAGNOSIS — M545 Low back pain: Secondary | ICD-10-CM | POA: Diagnosis not present

## 2017-07-02 DIAGNOSIS — H40013 Open angle with borderline findings, low risk, bilateral: Secondary | ICD-10-CM | POA: Diagnosis not present

## 2017-08-24 DIAGNOSIS — L281 Prurigo nodularis: Secondary | ICD-10-CM | POA: Diagnosis not present

## 2017-08-24 DIAGNOSIS — L409 Psoriasis, unspecified: Secondary | ICD-10-CM | POA: Diagnosis not present

## 2017-09-25 DIAGNOSIS — Z23 Encounter for immunization: Secondary | ICD-10-CM | POA: Diagnosis not present

## 2017-10-20 DIAGNOSIS — M545 Low back pain: Secondary | ICD-10-CM | POA: Diagnosis not present

## 2017-10-20 DIAGNOSIS — M5416 Radiculopathy, lumbar region: Secondary | ICD-10-CM | POA: Diagnosis not present

## 2017-10-26 DIAGNOSIS — M5416 Radiculopathy, lumbar region: Secondary | ICD-10-CM | POA: Diagnosis not present

## 2017-10-26 DIAGNOSIS — M545 Low back pain: Secondary | ICD-10-CM | POA: Diagnosis not present

## 2017-11-17 DIAGNOSIS — Z85528 Personal history of other malignant neoplasm of kidney: Secondary | ICD-10-CM | POA: Diagnosis not present

## 2017-11-19 DIAGNOSIS — Z85528 Personal history of other malignant neoplasm of kidney: Secondary | ICD-10-CM | POA: Diagnosis not present

## 2017-11-19 DIAGNOSIS — N281 Cyst of kidney, acquired: Secondary | ICD-10-CM | POA: Diagnosis not present

## 2017-11-19 DIAGNOSIS — R918 Other nonspecific abnormal finding of lung field: Secondary | ICD-10-CM | POA: Diagnosis not present

## 2017-11-19 DIAGNOSIS — R911 Solitary pulmonary nodule: Secondary | ICD-10-CM | POA: Diagnosis not present

## 2017-11-26 DIAGNOSIS — C642 Malignant neoplasm of left kidney, except renal pelvis: Secondary | ICD-10-CM | POA: Diagnosis not present

## 2017-11-26 DIAGNOSIS — E782 Mixed hyperlipidemia: Secondary | ICD-10-CM | POA: Diagnosis not present

## 2017-11-26 DIAGNOSIS — Z85528 Personal history of other malignant neoplasm of kidney: Secondary | ICD-10-CM | POA: Diagnosis not present

## 2017-11-26 DIAGNOSIS — I10 Essential (primary) hypertension: Secondary | ICD-10-CM | POA: Diagnosis not present

## 2017-11-26 DIAGNOSIS — E1165 Type 2 diabetes mellitus with hyperglycemia: Secondary | ICD-10-CM | POA: Diagnosis not present

## 2017-11-30 DIAGNOSIS — E1165 Type 2 diabetes mellitus with hyperglycemia: Secondary | ICD-10-CM | POA: Diagnosis not present

## 2017-11-30 DIAGNOSIS — E782 Mixed hyperlipidemia: Secondary | ICD-10-CM | POA: Diagnosis not present

## 2017-11-30 DIAGNOSIS — I10 Essential (primary) hypertension: Secondary | ICD-10-CM | POA: Diagnosis not present

## 2017-12-14 DIAGNOSIS — E1165 Type 2 diabetes mellitus with hyperglycemia: Secondary | ICD-10-CM | POA: Diagnosis not present

## 2017-12-14 DIAGNOSIS — I10 Essential (primary) hypertension: Secondary | ICD-10-CM | POA: Diagnosis not present

## 2017-12-14 DIAGNOSIS — C642 Malignant neoplasm of left kidney, except renal pelvis: Secondary | ICD-10-CM | POA: Diagnosis not present

## 2017-12-21 ENCOUNTER — Telehealth: Payer: Self-pay | Admitting: Podiatry

## 2017-12-21 DIAGNOSIS — M722 Plantar fascial fibromatosis: Secondary | ICD-10-CM | POA: Diagnosis not present

## 2017-12-30 DIAGNOSIS — M5416 Radiculopathy, lumbar region: Secondary | ICD-10-CM | POA: Diagnosis not present

## 2017-12-30 DIAGNOSIS — M545 Low back pain: Secondary | ICD-10-CM | POA: Diagnosis not present

## 2018-01-05 DIAGNOSIS — M5416 Radiculopathy, lumbar region: Secondary | ICD-10-CM | POA: Diagnosis not present

## 2018-01-05 DIAGNOSIS — M545 Low back pain: Secondary | ICD-10-CM | POA: Diagnosis not present

## 2018-01-05 NOTE — Telephone Encounter (Signed)
Dawn, are you going to put in charges, I will go ahead and order.

## 2018-01-18 DIAGNOSIS — M25512 Pain in left shoulder: Secondary | ICD-10-CM | POA: Diagnosis not present

## 2018-01-18 DIAGNOSIS — M7542 Impingement syndrome of left shoulder: Secondary | ICD-10-CM | POA: Insufficient documentation

## 2018-02-08 DIAGNOSIS — M25512 Pain in left shoulder: Secondary | ICD-10-CM | POA: Diagnosis not present

## 2018-02-08 DIAGNOSIS — M7542 Impingement syndrome of left shoulder: Secondary | ICD-10-CM | POA: Diagnosis not present

## 2018-02-19 DIAGNOSIS — M25512 Pain in left shoulder: Secondary | ICD-10-CM | POA: Diagnosis not present

## 2018-02-22 DIAGNOSIS — M75122 Complete rotator cuff tear or rupture of left shoulder, not specified as traumatic: Secondary | ICD-10-CM | POA: Diagnosis not present

## 2018-02-22 DIAGNOSIS — M25512 Pain in left shoulder: Secondary | ICD-10-CM | POA: Diagnosis not present

## 2018-03-09 DIAGNOSIS — R21 Rash and other nonspecific skin eruption: Secondary | ICD-10-CM | POA: Diagnosis not present

## 2018-03-09 DIAGNOSIS — L570 Actinic keratosis: Secondary | ICD-10-CM | POA: Diagnosis not present

## 2018-03-31 DIAGNOSIS — I10 Essential (primary) hypertension: Secondary | ICD-10-CM | POA: Diagnosis not present

## 2018-03-31 DIAGNOSIS — E1165 Type 2 diabetes mellitus with hyperglycemia: Secondary | ICD-10-CM | POA: Diagnosis not present

## 2018-03-31 DIAGNOSIS — E782 Mixed hyperlipidemia: Secondary | ICD-10-CM | POA: Diagnosis not present

## 2018-04-07 DIAGNOSIS — I10 Essential (primary) hypertension: Secondary | ICD-10-CM | POA: Diagnosis not present

## 2018-04-07 DIAGNOSIS — E782 Mixed hyperlipidemia: Secondary | ICD-10-CM | POA: Diagnosis not present

## 2018-04-07 DIAGNOSIS — E1165 Type 2 diabetes mellitus with hyperglycemia: Secondary | ICD-10-CM | POA: Diagnosis not present

## 2018-04-19 ENCOUNTER — Other Ambulatory Visit: Payer: Self-pay | Admitting: Physician Assistant

## 2018-04-19 DIAGNOSIS — D229 Melanocytic nevi, unspecified: Secondary | ICD-10-CM | POA: Diagnosis not present

## 2018-04-19 DIAGNOSIS — D485 Neoplasm of uncertain behavior of skin: Secondary | ICD-10-CM | POA: Diagnosis not present

## 2018-04-19 DIAGNOSIS — L281 Prurigo nodularis: Secondary | ICD-10-CM | POA: Diagnosis not present

## 2018-05-30 DIAGNOSIS — Z85528 Personal history of other malignant neoplasm of kidney: Secondary | ICD-10-CM | POA: Diagnosis not present

## 2018-06-03 DIAGNOSIS — Z85528 Personal history of other malignant neoplasm of kidney: Secondary | ICD-10-CM | POA: Diagnosis not present

## 2018-06-03 DIAGNOSIS — C649 Malignant neoplasm of unspecified kidney, except renal pelvis: Secondary | ICD-10-CM | POA: Diagnosis not present

## 2018-06-08 DIAGNOSIS — Z85528 Personal history of other malignant neoplasm of kidney: Secondary | ICD-10-CM | POA: Diagnosis not present

## 2018-07-04 DIAGNOSIS — M75102 Unspecified rotator cuff tear or rupture of left shoulder, not specified as traumatic: Secondary | ICD-10-CM | POA: Insufficient documentation

## 2018-07-05 DIAGNOSIS — E11319 Type 2 diabetes mellitus with unspecified diabetic retinopathy without macular edema: Secondary | ICD-10-CM | POA: Diagnosis not present

## 2018-07-25 DIAGNOSIS — Z0001 Encounter for general adult medical examination with abnormal findings: Secondary | ICD-10-CM | POA: Diagnosis not present

## 2018-07-25 DIAGNOSIS — E291 Testicular hypofunction: Secondary | ICD-10-CM | POA: Diagnosis not present

## 2018-07-28 DIAGNOSIS — E1165 Type 2 diabetes mellitus with hyperglycemia: Secondary | ICD-10-CM | POA: Diagnosis not present

## 2018-07-28 DIAGNOSIS — I10 Essential (primary) hypertension: Secondary | ICD-10-CM | POA: Diagnosis not present

## 2018-07-28 DIAGNOSIS — Z0001 Encounter for general adult medical examination with abnormal findings: Secondary | ICD-10-CM | POA: Diagnosis not present

## 2018-07-28 DIAGNOSIS — E782 Mixed hyperlipidemia: Secondary | ICD-10-CM | POA: Diagnosis not present

## 2018-07-28 DIAGNOSIS — Z1212 Encounter for screening for malignant neoplasm of rectum: Secondary | ICD-10-CM | POA: Diagnosis not present

## 2018-08-24 ENCOUNTER — Encounter (HOSPITAL_COMMUNITY): Payer: Self-pay

## 2018-09-11 IMAGING — NM NM MYOCAR MULTI W/SPECT W/WALL MOTION & EF
2 series · 12 of 12 positions shown · non-contrast
Comparison: none

[Series 1: rest · 8.28mm/px · 6 of 64 frames shown]
[frame 6/64]
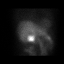
[frame 16/64]
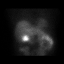
[frame 27/64]
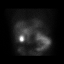
[frame 38/64]
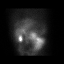
[frame 48/64]
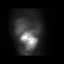
[frame 59/64]
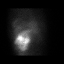

[Series 2: stress gated · 8.28mm/px · 6 of 64 frames shown]
[frame 6/64]
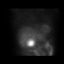
[frame 16/64]
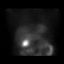
[frame 27/64]
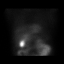
[frame 38/64]
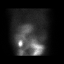
[frame 48/64]
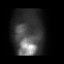
[frame 59/64]
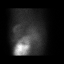

[12 of 12 positions shown; findings below may reference images not displayed]

Canned report from images found in remote index.

Refer to host system for actual result text.

## 2018-09-12 DIAGNOSIS — G8918 Other acute postprocedural pain: Secondary | ICD-10-CM | POA: Diagnosis not present

## 2018-09-12 DIAGNOSIS — S46012A Strain of muscle(s) and tendon(s) of the rotator cuff of left shoulder, initial encounter: Secondary | ICD-10-CM | POA: Diagnosis not present

## 2018-09-12 DIAGNOSIS — S43432A Superior glenoid labrum lesion of left shoulder, initial encounter: Secondary | ICD-10-CM | POA: Diagnosis not present

## 2018-09-12 DIAGNOSIS — S46292A Other injury of muscle, fascia and tendon of other parts of biceps, left arm, initial encounter: Secondary | ICD-10-CM | POA: Diagnosis not present

## 2018-09-14 ENCOUNTER — Other Ambulatory Visit: Payer: Self-pay

## 2018-09-14 ENCOUNTER — Ambulatory Visit (HOSPITAL_COMMUNITY): Payer: 59 | Attending: Orthopedic Surgery | Admitting: Occupational Therapy

## 2018-09-14 ENCOUNTER — Encounter (HOSPITAL_COMMUNITY): Payer: Self-pay | Admitting: Occupational Therapy

## 2018-09-14 DIAGNOSIS — M25512 Pain in left shoulder: Secondary | ICD-10-CM | POA: Diagnosis not present

## 2018-09-14 DIAGNOSIS — R29898 Other symptoms and signs involving the musculoskeletal system: Secondary | ICD-10-CM | POA: Insufficient documentation

## 2018-09-14 DIAGNOSIS — M25612 Stiffness of left shoulder, not elsewhere classified: Secondary | ICD-10-CM | POA: Diagnosis not present

## 2018-09-14 NOTE — Patient Instructions (Signed)
TOWEL SLIDES COMPLETE FOR 1-3 MINUTES, 3-5 TIMES PER DAY  SHOULDER: Flexion On Table   Place hands on table, elbows straight. Move hips away from body. Press hands down into table.  _15__ reps per set, _3__ sets per day  Abduction (Passive)   With arm out to side, resting on table, lower head toward arm, keeping trunk away from table.  Repeat __15__ times. Do _3___ sessions per day.  Copyright  VHI. All rights reserved.     Internal Rotation (Assistive)   Seated with elbow bent at right angle and held against side, slide arm on table surface in an inward arc. Repeat __15__ times. Do __3__ sessions per day. Activity: Use this motion to brush crumbs off the table.   AROM: Wrist Extension   With right palm down, bend wrist up. Repeat 10____ times per set. Do ____ sets per session. Do __3__ sessions per day.  Copyright  VHI. All rights reserved.   AROM: Wrist Flexion   With right palm up, bend wrist up. Repeat ___10_ times per set. Do __1__ sets per session. Do __3__ sessions per day.  Copyright  VHI. All rights reserved.   AROM: Forearm Pronation / Supination   With right arm in handshake position, slowly rotate palm down until stretch is felt. Relax. Then rotate palm up until stretch is felt. Repeat __10__ times per set. Do __1__ sets per session. Do __3__ sessions per day.  Copyright  VHI. All rights reserved.    With left hand palm up, gently bend elbow as far as possible. Then straighten arm as far as possible. Repeat __10__ times per set. Do _1___ sets per session. Do __3__ sessions per day.  Copyright  VHI. All rights reserved.

## 2018-09-14 NOTE — Therapy (Signed)
[place], [place], [address] Phone: [phone]   Fax:  [phone]  Occupational Therapy Evaluation  Patient Details  Name: Douglas Robertson. MRN: [id number] Date of Birth: 03/12/1966 Referring Provider (OT): Dr. Netta Cedars   Encounter Date: 09/14/2018  OT End of Session - 09/14/18 1032    Visit Number  1    Number of Visits  16    Date for OT Re-Evaluation  11/13/18   mini-reassessment 10/14/2018   Authorization Type  [employer]     Authorization Time Period  60 visit limit, 0 used as of 09/14/2018; $17 copay    Authorization - Visit Number  1    Authorization - Number of Visits  37    OT Start Time  0948    OT Stop Time  1020    OT Time Calculation (min)  32 min    Activity Tolerance  Patient tolerated treatment well    Behavior During Therapy  [facility] for tasks assessed/performed       Past Medical History:  Diagnosis Date  . Cancer ([place])    kidney (L)  . Chronic kidney disease 2017   left renal mass  . Diabetes mellitus without complication ([place])   . Hypertension     Past Surgical History:  Procedure Laterality Date  . COLONOSCOPY  2012  . COLONOSCOPY N/A 09/03/2016   Procedure: COLONOSCOPY;  Surgeon: Rogene Houston, MD;  [facility];  Service: Endoscopy;  Laterality: N/A;  730  . KNEE ARTHROSCOPY  1998    right   . LAPAROSCOPIC NEPHRECTOMY Left 10/10/2015   Procedure: LAPAROSCOPIC RADICAL NEPHRECTOMY;  Surgeon: Raynelle Bring, MD;  Location: [facility];  Service: Urology;  Laterality: Left;    There were no vitals filed for this visit.  Subjective Assessment - 09/14/18 1027    Subjective   S: I'm not sure what he said about my surgery.     Patient is accompained by:  Family member   wife, 2 daughters   Pertinent History  Pt is a 53 y/o male s/p left RCR on 09/13/2018. Pt presents in sling with abduction pillow, reports he removes for exercises and sits with arm propped up on a pillow. Pt was  referred to occupational therapy for evaluation and treatment by Dr. Netta Cedars.     Special Tests  FOTO: 23/100    Patient Stated Goals  To be able to use my arm.    Currently in Pain?  Yes    Pain Score  5     Pain Location  Shoulder    Pain Orientation  Left    Pain Descriptors / Indicators  Aching;Throbbing    Pain Type  Acute pain    Pain Radiating Towards  n/a    Pain Onset  In the past 7 days    Pain Frequency  Constant    Aggravating Factors   movement    Pain Relieving Factors  pain medication, ice    Effect of Pain on Daily Activities  unable to use LUE during any functional tasks    Multiple Pain Sites  No        [facility] OT Assessment - 09/14/18 0945      Assessment   Medical Diagnosis  s/p left RC repair    Referring Provider (OT)  Dr. Netta Cedars    Onset Date/Surgical Date  09/12/18    Hand Dominance  Right    Next  MD Visit  09/22/2018    Prior Therapy  None      Precautions   Precautions  Shoulder    Type of Shoulder Precautions  See protocol: No abduction until week 12 (3/30). Phase I weeks 1-4 (1/6-2/3): supine P/ROM-flexion to 90, remaining to tolerance. Isometrics. Phase II weeks 5-8 (2/4-2/24): progress P/ROM to full, pulleys, AA/ROM. Phase III weeks 9-12 (2/25-3/23): A/ROM, theraband, light resistive exercises. Phase IV weeks 13+ (3/24): progress resistive strengthening to tolerance.     Shoulder Interventions  Shoulder sling/immobilizer;Shoulder abduction pillow;At all times      Restrictions   Weight Bearing Restrictions  No      Balance Screen   Has the patient fallen in the past 6 months  No    Has the patient had a decrease in activity level because of a fear of falling?   No    Is the patient reluctant to leave their home because of a fear of falling?   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       Observation/Other Assessments   Focus on Therapeutic Outcomes (FOTO)   23/100      ROM / Strength   AROM / PROM / Strength  AROM;PROM;Strength      Palpation   Palpation comment  moderate fascial restrictions in upper arm, unable to assess further due to bandage      AROM   Overall AROM   Unable to assess;Due to precautions;Due to pain    Overall AROM Comments  --    AROM Assessment Site  Shoulder    Right/Left Shoulder  Left      PROM   Overall PROM Comments  Assessed supine, er/IR adducted    PROM Assessment Site  Shoulder    Right/Left Shoulder  Left    Left Shoulder Flexion  110 Degrees    Left Shoulder ABduction  115 Degrees    Left Shoulder Internal Rotation  90 Degrees    Left Shoulder External Rotation  0 Degrees      Strength   Overall Strength  Unable to assess;Due to precautions;Due to pain    Overall Strength Comments  --    Strength Assessment Site  Shoulder    Right/Left Shoulder  Left                      OT Education - 09/14/18 1031    Education Details  table slides (no abduction), wrist exercises    Person(s) Educated  Patient;Child(ren);Spouse    Methods  Explanation;Demonstration;Handout    Comprehension  Verbalized understanding;Returned demonstration       OT Short Term Goals - 09/14/18 1304      OT SHORT TERM GOAL #1   Title  Pt will be provided with and educated on HEP to improve functional use of LUE during ADL completion.     Time  4    Period  Weeks    Status  New    Target Date  10/14/18      OT SHORT TERM GOAL #2   Title  Pt will increase LUE P/ROM to [facility] to  improve ability to reach for items at table height.     Time  4    Period  Weeks    Status  New      OT SHORT TERM GOAL #3   Title  Pt will decrease pain in LUE to 5/10 to improve ability use sleep for minimum of 4 consecutive  hours.     Time  4    Period  Weeks    Status  New        OT Long Term Goals - 09/14/18 1307      OT LONG TERM GOAL #1   Title  Pt will return to highest level of functioning using LUE as non-dominant during ADL and work task completion.     Time  8    Period  Weeks    Status  New    Target Date  11/13/18      OT LONG TERM GOAL #2   Title  Pt will decrease LUE fascial restrictions to minimal amounts or less to improve mobility required for functional reaching tasks.     Time  8    Period  Weeks    Status  New      OT LONG TERM GOAL #3   Title  Pt will decrease pain in LUE to 3/10 or less to improve ability to use LUE for dressing tasks.     Time  8    Period  Weeks    Status  New      OT LONG TERM GOAL #4   Title  Pt will increase LUE A/ROM to [facility] to improve ability to reach overhead and behind back during ADL completion.     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     Time  8    Period  Weeks    Status  New            Plan - 09/14/18 1034    Clinical Impression Statement  A: Pt is a 53 y/o male s/p left RCR on 09/12/2018 presenting with limitations to functional use of LUE during daily tasks. Pt has been completing pendulums and leg slides given by MD after sx.     Occupational Profile and client history currently impacting functional performance  Pt is independent and motivated to return to prior level of functioning.     Occupational performance deficits (Please refer to evaluation for details):  ADL's;IADL's;Rest and Sleep;Work;Leisure    Rehab Potential  Good    OT Frequency  2x / week    OT Duration  8 weeks    OT Treatment/Interventions  Self-care/ADL training;Moist Heat;Therapeutic activities;Therapeutic exercise;Ultrasound;Cryotherapy;Passive range of motion;Electrical Stimulation;Manual  Therapy;Patient/family education    Plan  P: Pt will benefit from skilled OT services to decrease pain and fascial restrictions, increase ROM, strength, and functional use of LUE as dominant. Treatment plan: myofascial release, manual therapy, P/ROM, AA/ROM, A/ROM, general LUE strengthening and stability, modalities prn    Clinical Decision Making  Limited treatment options, no task modification necessary    Consulted and Agree with Plan of Care  Patient;Family member/caregiver    Family Member Consulted  wife       Patient will benefit from skilled therapeutic intervention in  order to improve the following deficits and impairments:  Decreased range of motion, Impaired flexibility, Decreased activity tolerance, Increased fascial restrictions, Impaired UE functional use, Decreased strength  Visit Diagnosis: Acute pain of left shoulder  Other symptoms and signs involving the musculoskeletal system  Stiffness of left shoulder, not elsewhere classified    Problem List Patient Active Problem List   Diagnosis Date Noted  . History of colonic polyps 05/18/2016  . 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 Biceps tendinitis of both shoulders 10/23/2014  . Right anterior knee pain 10/23/2014  . 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 Left shoulder pain 04/12/2013    Guadelupe Sabin, OTR/L  [phone] 09/14/2018, 1:31 PM  [facility] [place] [address] [place], [place], [address] Phone: [phone]   Fax:  [phone]  Name: Douglas Robertson. MRN: [id number] Date of Birth: 1965/10/04

## 2018-09-16 ENCOUNTER — Encounter (HOSPITAL_COMMUNITY): Payer: Self-pay | Admitting: Occupational Therapy

## 2018-09-16 ENCOUNTER — Ambulatory Visit (HOSPITAL_COMMUNITY): Payer: 59 | Admitting: Occupational Therapy

## 2018-09-16 DIAGNOSIS — R29898 Other symptoms and signs involving the musculoskeletal system: Secondary | ICD-10-CM

## 2018-09-16 DIAGNOSIS — M25512 Pain in left shoulder: Secondary | ICD-10-CM

## 2018-09-16 DIAGNOSIS — M25612 Stiffness of left shoulder, not elsewhere classified: Secondary | ICD-10-CM

## 2018-09-16 NOTE — Therapy (Signed)
[place], [place], [address] Phone: [phone]   Fax:  [phone]  Occupational Therapy Treatment  Patient Details  Name: Douglas Robertson. MRN: [id number] Date of Birth: [birth year] Referring Provider (OT): Dr. Netta Cedars   Encounter Date: 09/16/2018  OT End of Session - 09/16/18 1024    Visit Number  2    Number of Visits  16    Date for OT Re-Evaluation  11/13/18   mini-reassessment 10/14/2018   Authorization Type  UHC     Authorization Time Period  60 visit limit, 0 used as of 09/14/2018; $17 copay    Authorization - Visit Number  2    Authorization - Number of Visits  45    OT Start Time  0941    OT Stop Time  1020    OT Time Calculation (min)  39 min    Activity Tolerance  Patient tolerated treatment well    Behavior During Therapy  [facility] for tasks assessed/performed       Past Medical History:  Diagnosis Date  . Cancer ([place])    kidney (L)  . Chronic kidney disease 2017   left renal mass  . Diabetes mellitus without complication ([place])   . Hypertension     Past Surgical History:  Procedure Laterality Date  . COLONOSCOPY  2012  . COLONOSCOPY N/A 09/03/2016   Procedure: COLONOSCOPY;  Surgeon: Rogene Houston, MD;  [facility];  Service: Endoscopy;  Laterality: N/A;  730  . KNEE ARTHROSCOPY  1998    right   . LAPAROSCOPIC NEPHRECTOMY Left 10/10/2015   Procedure: LAPAROSCOPIC RADICAL NEPHRECTOMY;  Surgeon: Raynelle Bring, MD;  Location: [facility];  Service: Urology;  Laterality: Left;    There were no vitals filed for this visit.  Subjective Assessment - 09/16/18 0941    Subjective   S: I just feel real tight around the incision area.     Currently in Pain?  Yes    Pain Score  4     Pain Location  Shoulder    Pain Orientation  Left    Pain Descriptors / Indicators  Aching;Sore    Pain Type  Acute pain    Pain Radiating Towards  N/A    Pain Onset  In the past 7 days    Pain Frequency   Constant    Aggravating Factors   movement    Pain Relieving Factors  pain medication, ice    Effect of Pain on Daily Activities  unable to use LUE during any ADLs    Multiple Pain Sites  No         [facility] OT Assessment - 09/16/18 0941      Assessment   Medical Diagnosis  s/p left RC repair      Precautions   Precautions  Shoulder    Type of Shoulder Precautions  See protocol: No abduction until week 12 (3/30). Phase I weeks 1-4 (1/6-2/3): supine P/ROM-flexion to 90, remaining to tolerance. Isometrics. Phase II weeks 5-8 (2/4-2/24): progress P/ROM to full, pulleys, AA/ROM. Phase III weeks 9-12 (2/25-3/23): A/ROM, theraband, light resistive exercises. Phase IV weeks 13+ (3/24): progress resistive strengthening to tolerance.     Shoulder Interventions  Shoulder sling/immobilizer;Shoulder abduction pillow;At all times               OT Treatments/Exercises (OP) - 09/16/18 0946      Exercises   Exercises  Shoulder  Shoulder Exercises: Supine   Protraction  PROM;10 reps    Horizontal ABduction  PROM;10 reps;Limitations    Horizontal ABduction Limitations  adduction only     External Rotation  PROM;AAROM;10 reps    Internal Rotation  PROM;AAROM;10 reps    Flexion  PROM;10 reps;Limitations    Flexion Limitations  to 90 per protocol    ABduction  Limitations    ABduction Limitations  no abduction x 12 weeks per protocol      Shoulder Exercises: Seated   Elevation  AROM;10 reps    Extension  AROM;10 reps    Other Seated Exercises  scapular depression A/ROM 10X      Shoulder Exercises: Therapy Ball   Flexion  10 reps      Shoulder Exercises: ROM/Strengthening   Other ROM/Strengthening Exercises  seated towel slide, er/IR 10X      Shoulder Exercises: Isometric Strengthening   Flexion  Supine;3X3"    Extension  Supine;3X3"    External Rotation  Supine;3X3"    Internal Rotation  Supine;3X3"    ABduction  Supine;3X3"    ADduction  Supine;3X3"      Manual Therapy    Manual Therapy  Myofascial release    Manual therapy comments  completed separately from therapeutic exercises    Myofascial Release  myofascial release and manual techniques to left upper arm, trapezius, and scapularis regions to decrease pain and fascial restrictions and increase joint range of motion               OT Short Term Goals - 09/16/18 1027      OT SHORT TERM GOAL #1   Title  Pt will be provided with and educated on HEP to improve functional use of LUE during ADL completion.     Time  4    Period  Weeks    Status  On-going      OT SHORT TERM GOAL #2   Title  Pt will increase LUE P/ROM to [facility] to improve ability to reach for items at table height.     Time  4    Period  Weeks    Status  On-going      OT SHORT TERM GOAL #3   Title  Pt will decrease pain in LUE to 5/10 to improve ability use sleep for minimum of 4 consecutive hours.     Time  4    Period  Weeks    Status  On-going        OT Long Term Goals - 09/16/18 1027      OT LONG TERM GOAL #1   Title  Pt will return to highest level of functioning using LUE as non-dominant during ADL and work task completion.     Time  8    Period  Weeks    Status  On-going      OT LONG TERM GOAL #2   Title  Pt will decrease LUE fascial restrictions to minimal amounts or less to improve mobility required for functional reaching tasks.     Time  8    Period  Weeks    Status  On-going      OT LONG TERM GOAL #3   Title  Pt will decrease pain in LUE to 3/10 or less to improve ability to use LUE for dressing tasks.     Time  8    Period  Weeks    Status  On-going      OT LONG TERM  GOAL #4   Title  Pt will increase LUE A/ROM to [facility] to improve ability to reach overhead and behind back during ADL completion.     Time  8    Period  Weeks    Status  On-going      OT LONG TERM GOAL #5   Title  Pt will improve LUE strength to 4+/5 or greater to improve ability to perform lifting tasks required for maintenance  mechanic work.     Time  8    Period  Weeks    Status  On-going            Plan - 09/16/18 1024    Clinical Impression Statement  A: Initiated myofascial release and manual therapy, P/ROM, isometrics, and IR/er AA/ROM per protocol. Pt tolerating passive stretching well, achieving protocol limit of 90 degrees flexion and slightly past neutral with er. Also completed scapular ROM and therapy ball exercises. Occasional verbal cuing for form and technique.     Plan  P: continue with protocol, add scapular row       Patient will benefit from skilled therapeutic intervention in order to improve the following deficits and impairments:  Decreased range of motion, Impaired flexibility, Decreased activity tolerance, Increased fascial restrictions, Impaired UE functional use, Decreased strength  Visit Diagnosis: Acute pain of left shoulder  Other symptoms and signs involving the musculoskeletal system  Stiffness of left shoulder, not elsewhere classified    Problem List Patient Active Problem List   Diagnosis Date Noted  . History of colonic polyps 05/18/2016  . 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 Biceps tendinitis of both shoulders 10/23/2014  . Right anterior knee pain 10/23/2014  . 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 Left shoulder pain 04/12/2013   Guadelupe Sabin, OTR/L  [phone] 09/16/2018, 10:27 AM  [facility] [address] [place], [place], [address] Phone: [phone]   Fax:  [phone]  Name: Douglas Robertson. MRN: [id number] Date of Birth: Feb 19, 1966

## 2018-09-19 ENCOUNTER — Ambulatory Visit (HOSPITAL_COMMUNITY): Payer: 59 | Admitting: Occupational Therapy

## 2018-09-19 ENCOUNTER — Encounter (HOSPITAL_COMMUNITY): Payer: Self-pay | Admitting: Occupational Therapy

## 2018-09-19 DIAGNOSIS — M25512 Pain in left shoulder: Secondary | ICD-10-CM

## 2018-09-19 DIAGNOSIS — R29898 Other symptoms and signs involving the musculoskeletal system: Secondary | ICD-10-CM

## 2018-09-19 DIAGNOSIS — M25612 Stiffness of left shoulder, not elsewhere classified: Secondary | ICD-10-CM

## 2018-09-19 NOTE — Therapy (Signed)
[place], [place], [address] Phone: [phone]   Fax:  [phone]  Occupational Therapy Treatment  Patient Details  Name: Douglas Robertson. MRN: [id number] Date of Birth: [birth year] Referring Provider (OT): Dr. Netta Cedars   Encounter Date: 09/19/2018  OT End of Session - 09/19/18 1021    Visit Number  3    Number of Visits  16    Date for OT Re-Evaluation  11/13/18   mini-reassessment 10/14/2018   Authorization Type  UHC     Authorization Time Period  60 visit limit, 0 used as of 09/14/2018; $17 copay    Authorization - Visit Number  3    Authorization - Number of Visits  4    OT Start Time  0940    OT Stop Time  1021    OT Time Calculation (min)  41 min    Activity Tolerance  Patient tolerated treatment well    Behavior During Therapy  [facility] for tasks assessed/performed       Past Medical History:  Diagnosis Date  . Cancer ([place])    kidney (L)  . Chronic kidney disease 2017   left renal mass  . Diabetes mellitus without complication ([place])   . Hypertension     Past Surgical History:  Procedure Laterality Date  . COLONOSCOPY  2012  . COLONOSCOPY N/A 09/03/2016   Procedure: COLONOSCOPY;  Surgeon: Rogene Houston, MD;  [facility];  Service: Endoscopy;  Laterality: N/A;  730  . KNEE ARTHROSCOPY  1998    right   . LAPAROSCOPIC NEPHRECTOMY Left 10/10/2015   Procedure: LAPAROSCOPIC RADICAL NEPHRECTOMY;  Surgeon: Raynelle Bring, MD;  [facility];  Service: Urology;  Laterality: Left;    There were no vitals filed for this visit.  Subjective Assessment - 09/19/18 0940    Subjective   S: Friday night was awful.     Currently in Pain?  Yes    Pain Score  5     Pain Location  Shoulder    Pain Orientation  Left    Pain Descriptors / Indicators  Aching;Constant    Pain Type  Acute pain    Pain Radiating Towards  N/A    Pain Onset  In the past 7 days    Pain Frequency  Constant    Aggravating Factors   movement    Pain Relieving Factors  pain medication, ice    Effect of Pain on Daily Activities  unable to use LUE during ADLs    Multiple Pain Sites  No         [facility] OT Assessment - 09/19/18 0939      Assessment   Medical Diagnosis  s/p left RC repair      Precautions   Precautions  Shoulder    Type of Shoulder Precautions  See protocol: No abduction until week 12 (3/30). Phase I weeks 1-4 (1/6-2/3): supine P/ROM-flexion to 90, remaining to tolerance. Isometrics. Phase II weeks 5-8 (2/4-2/24): progress P/ROM to full, pulleys, AA/ROM. Phase III weeks 9-12 (2/25-3/23): A/ROM, theraband, light resistive exercises. Phase IV weeks 13+ (3/24): progress resistive strengthening to tolerance.     Shoulder Interventions  Shoulder sling/immobilizer;Shoulder abduction pillow;At all times               OT Treatments/Exercises (OP) - 09/19/18 0240      Exercises   Exercises  Shoulder      Shoulder Exercises: Supine  Protraction  PROM;10 reps    Horizontal ABduction  PROM;10 reps;Limitations    Horizontal ABduction Limitations  adduction only     External Rotation  PROM;AAROM;10 reps    Internal Rotation  PROM;AAROM;10 reps    Flexion  PROM;10 reps;Limitations    Flexion Limitations  to 90 per protocol    ABduction  Limitations    ABduction Limitations  no abduction x 12 weeks per protocol      Shoulder Exercises: Seated   Elevation  AROM;10 reps    Extension  AROM;10 reps    Row  AROM;10 reps    Other Seated Exercises  scapular depression A/ROM 10X      Shoulder Exercises: Therapy Ball   Flexion  10 reps      Shoulder Exercises: ROM/Strengthening   Pendulum  foward/backward, side to side, circular 1' each     Other ROM/Strengthening Exercises  seated towel slide, er/IR 10X      Shoulder Exercises: Isometric Strengthening   Flexion  Supine;3X3"    Extension  Supine;3X3"    External Rotation  Supine;3X3"    Internal Rotation  Supine;3X3"     ABduction  Supine;3X3"    ADduction  Supine;3X3"      Manual Therapy   Manual Therapy  Myofascial release    Manual therapy comments  completed separately from therapeutic exercises    Myofascial Release  myofascial release and manual techniques to left upper arm, trapezius, and scapularis regions to decrease pain and fascial restrictions and increase joint range of motion               OT Short Term Goals - 09/16/18 1027      OT SHORT TERM GOAL #1   Title  Pt will be provided with and educated on HEP to improve functional use of LUE during ADL completion.     Time  4    Period  Weeks    Status  On-going      OT SHORT TERM GOAL #2   Title  Pt will increase LUE P/ROM to [facility] to improve ability to reach for items at table height.     Time  4    Period  Weeks    Status  On-going      OT SHORT TERM GOAL #3   Title  Pt will decrease pain in LUE to 5/10 to improve ability use sleep for minimum of 4 consecutive hours.     Time  4    Period  Weeks    Status  On-going        OT Long Term Goals - 09/16/18 1027      OT LONG TERM GOAL #1   Title  Pt will return to highest level of functioning using LUE as non-dominant during ADL and work task completion.     Time  8    Period  Weeks    Status  On-going      OT LONG TERM GOAL #2   Title  Pt will decrease LUE fascial restrictions to minimal amounts or less to improve mobility required for functional reaching tasks.     Time  8    Period  Weeks    Status  On-going      OT LONG TERM GOAL #3   Title  Pt will decrease pain in LUE to 3/10 or less to improve ability to use LUE for dressing tasks.     Time  8    Period  Weeks    Status  On-going      OT LONG TERM GOAL #4   Title  Pt will increase LUE A/ROM to [facility] to improve ability to reach overhead and behind back during ADL completion.     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     Time  8    Period  Weeks    Status  On-going            Plan - 09/19/18 1021    Clinical Impression Statement  A: Pt reporting increased pain Friday night and Saturday after session on Friday, improved by today. Continued with phase I of protocol. Completed manual therapy to address fascial restrictions causing pain and ROM limitations. Pt with increased tightness and moderate muscle knot palpated at bicep and upper arm regions today. er continues to be most difficult for pt, able to passive stretch slightly past neutral today. Verbal cuing for form and technique.     Plan  P: Continue with phase I of protocol, increase isometrics to 3x5"       Patient will benefit from skilled therapeutic intervention in order to improve the following deficits and impairments:  Decreased range of motion, Impaired flexibility, Decreased activity tolerance, Increased fascial restrictions, Impaired UE functional use, Decreased strength  Visit Diagnosis: Acute pain of left shoulder  Other symptoms and signs involving the musculoskeletal system  Stiffness of left shoulder, not elsewhere classified    Problem List Patient Active Problem List   Diagnosis Date Noted  . History of colonic polyps 05/18/2016  . 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 Biceps tendinitis of both shoulders 10/23/2014  . Right anterior knee pain 10/23/2014  . 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 Left shoulder pain 04/12/2013   Guadelupe Sabin, OTR/L  [phone] 09/19/2018, 10:24 AM  [facility] [address] [place], [place], [address] Phone: [phone]   Fax:  [phone]  Name: Kenyon Eichelberger. MRN: [id number] Date of Birth: May 04, 1966

## 2018-09-21 ENCOUNTER — Ambulatory Visit (HOSPITAL_COMMUNITY): Payer: 59 | Admitting: Occupational Therapy

## 2018-09-21 ENCOUNTER — Encounter (HOSPITAL_COMMUNITY): Payer: Self-pay | Admitting: Occupational Therapy

## 2018-09-21 DIAGNOSIS — M25512 Pain in left shoulder: Secondary | ICD-10-CM | POA: Diagnosis not present

## 2018-09-21 DIAGNOSIS — M25612 Stiffness of left shoulder, not elsewhere classified: Secondary | ICD-10-CM

## 2018-09-21 DIAGNOSIS — R29898 Other symptoms and signs involving the musculoskeletal system: Secondary | ICD-10-CM

## 2018-09-21 NOTE — Therapy (Signed)
[place], [place], [address] Phone: [phone]   Fax:  [phone]  Occupational Therapy Treatment  Patient Details  Name: Douglas Robertson. MRN: [id number] Date of Birth: Nov 30, 1965 Referring Provider (OT): Dr. Netta Cedars   Encounter Date: 09/21/2018  OT End of Session - 09/21/18 1026    Visit Number  4    Number of Visits  16    Date for OT Re-Evaluation  11/13/18   mini-reassessment 10/14/2018   Authorization Type  [employer]     Authorization Time Period  60 visit limit, 0 used as of 09/14/2018; $17 copay    Authorization - Visit Number  4    Authorization - Number of Visits  48    OT Start Time  [phone]    OT Stop Time  1025    OT Time Calculation (min)  43 min    Activity Tolerance  Patient tolerated treatment well    Behavior During Therapy  [facility] for tasks assessed/performed       Past Medical History:  Diagnosis Date  . Cancer ([place])    kidney (L)  . Chronic kidney disease 2017   left renal mass  . Diabetes mellitus without complication ([place])   . Hypertension     Past Surgical History:  Procedure Laterality Date  . COLONOSCOPY  2012  . COLONOSCOPY N/A 09/03/2016   Procedure: COLONOSCOPY;  Surgeon: Rogene Houston, MD;  [facility];  Service: Endoscopy;  Laterality: N/A;  730  . KNEE ARTHROSCOPY  1998    right   . LAPAROSCOPIC NEPHRECTOMY Left 10/10/2015   Procedure: LAPAROSCOPIC RADICAL NEPHRECTOMY;  Surgeon: Raynelle Bring, MD;  Location: [facility];  Service: Urology;  Laterality: Left;    There were no vitals filed for this visit.  Subjective Assessment - 09/21/18 0938    Subjective   S: I slept pretty good last night.     Currently in Pain?  Yes    Pain Score  5     Pain Location  Shoulder    Pain Orientation  Left    Pain Descriptors / Indicators  Aching;Constant    Pain Type  Acute pain    Pain Radiating Towards  elbow to shoulder    Pain Onset  In the past 7 days    Pain  Frequency  Constant    Aggravating Factors   movement    Pain Relieving Factors  pain medication, ice    Effect of Pain on Daily Activities  unable to use LUE during ADLs.          [facility] OT Assessment - 09/21/18 0937      Assessment   Medical Diagnosis  s/p left RC repair      Precautions   Precautions  Shoulder    Type of Shoulder Precautions  See protocol: No abduction until week 12 (3/30). Phase I weeks 1-4 (1/6-2/3): supine P/ROM-flexion to 90, remaining to tolerance. Isometrics. Phase II weeks 5-8 (2/4-2/24): progress P/ROM to full, pulleys, AA/ROM. Phase III weeks 9-12 (2/25-3/23): A/ROM, theraband, light resistive exercises. Phase IV weeks 13+ (3/24): progress resistive strengthening to tolerance.     Shoulder Interventions  Shoulder sling/immobilizer;Shoulder abduction pillow;At all times               OT Treatments/Exercises (OP) - 09/21/18 2025      Exercises   Exercises  Shoulder      Shoulder Exercises: Supine   Protraction  PROM;10 reps    Horizontal ABduction  PROM;10 reps;Limitations    Horizontal ABduction Limitations  adduction only     External Rotation  PROM;AAROM;10 reps    Internal Rotation  PROM;AAROM;10 reps    Flexion  PROM;10 reps;Limitations    Flexion Limitations  to 90 per protocol    ABduction  Limitations    ABduction Limitations  no abduction x 12 weeks per protocol      Shoulder Exercises: Seated   Elevation  AROM;10 reps    Extension  AROM;10 reps    Row  AROM;10 reps    Other Seated Exercises  scapular depression A/ROM 10X      Shoulder Exercises: Therapy Ball   Flexion  10 reps      Shoulder Exercises: ROM/Strengthening   Anterior Glide  3x5"    Caudal Glide  3x5"    Other ROM/Strengthening Exercises  seated towel slide, er/IR 10X      Shoulder Exercises: Isometric Strengthening   Flexion  Supine;3X5"    Extension  Supine;3X5"    External Rotation  Supine;3X5"    Internal Rotation  Supine;3X5"    ABduction  Supine;3X5"     ADduction  Supine;3X5"      Manual Therapy   Manual Therapy  Myofascial release    Manual therapy comments  completed separately from therapeutic exercises    Myofascial Release  myofascial release and manual techniques to left upper arm, trapezius, and scapularis regions to decrease pain and fascial restrictions and increase joint range of motion               OT Short Term Goals - 09/16/18 1027      OT SHORT TERM GOAL #1   Title  Pt will be provided with and educated on HEP to improve functional use of LUE during ADL completion.     Time  4    Period  Weeks    Status  On-going      OT SHORT TERM GOAL #2   Title  Pt will increase LUE P/ROM to [facility] to improve ability to reach for items at table height.     Time  4    Period  Weeks    Status  On-going      OT SHORT TERM GOAL #3   Title  Pt will decrease pain in LUE to 5/10 to improve ability use sleep for minimum of 4 consecutive hours.     Time  4    Period  Weeks    Status  On-going        OT Long Term Goals - 09/16/18 1027      OT LONG TERM GOAL #1   Title  Pt will return to highest level of functioning using LUE as non-dominant during ADL and work task completion.     Time  8    Period  Weeks    Status  On-going      OT LONG TERM GOAL #2   Title  Pt will decrease LUE fascial restrictions to minimal amounts or less to improve mobility required for functional reaching tasks.     Time  8    Period  Weeks    Status  On-going      OT LONG TERM GOAL #3   Title  Pt will decrease pain in LUE to 3/10 or less to improve ability to use LUE for dressing tasks.     Time  8    Period  Weeks  Status  On-going      OT LONG TERM GOAL #4   Title  Pt will increase LUE A/ROM to [facility] to improve ability to reach overhead and behind back during ADL completion.     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     Time  8    Period  Weeks    Status  On-going            Plan - 09/21/18 1026    Clinical Impression Statement  A: Pt reports he is completing HEP 4-5x/day, slept ok last night but woke with throbbing pain at 5 this am. Continued with phase I of protocol, completing manual therapy to address fascial restrictions causing pain. Increased supine isometrics to 3x5" today and added anterior/caudal glides. Pt continues to be most limited and have the most pain with er, although OT notes greater ease with passive stretching. Verbal cuing for form and technique, cautioned pt not to overdo HEP and cause more pain.     Plan  P: Follow up on MD appt, continue with phase I working to improve tolerance to P/ROM especially with er       Patient will benefit from skilled therapeutic intervention in order to improve the following deficits and impairments:  Decreased range of motion, Impaired flexibility, Decreased activity tolerance, Increased fascial restrictions, Impaired UE functional use, Decreased strength  Visit Diagnosis: Acute pain of left shoulder  Other symptoms and signs involving the musculoskeletal system  Stiffness of left shoulder, not elsewhere classified    Problem List Patient Active Problem List   Diagnosis Date Noted  . History of colonic polyps 05/18/2016  . 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 Biceps tendinitis of both shoulders 10/23/2014  . Right anterior knee pain 10/23/2014  . 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 Left shoulder pain 04/12/2013   Guadelupe Sabin, OTR/L  [phone] 09/21/2018, 10:29 AM  [place] [address] [place], [place], [address] Phone: [phone]   Fax:  [phone]  Name: Hannibal Skalla. MRN: [id number] Date of Birth: 1966-09-06

## 2018-09-26 ENCOUNTER — Encounter (HOSPITAL_COMMUNITY): Payer: Self-pay | Admitting: Specialist

## 2018-09-26 ENCOUNTER — Ambulatory Visit (HOSPITAL_COMMUNITY): Payer: 59 | Admitting: Specialist

## 2018-09-26 DIAGNOSIS — R29898 Other symptoms and signs involving the musculoskeletal system: Secondary | ICD-10-CM

## 2018-09-26 DIAGNOSIS — M25612 Stiffness of left shoulder, not elsewhere classified: Secondary | ICD-10-CM

## 2018-09-26 DIAGNOSIS — M25512 Pain in left shoulder: Secondary | ICD-10-CM

## 2018-09-26 NOTE — Therapy (Addendum)
[place], [place], [address] Phone: [phone]   Fax:  [phone]  Occupational Therapy Treatment  Patient Details  Name: Douglas Robertson. MRN: [id number] Date of Birth: February 20, 1966 Referring Provider (OT): Dr. Netta Cedars   Encounter Date: 09/26/2018  OT End of Session - 09/26/18 1539    Visit Number  5    Number of Visits  16    Date for OT Re-Evaluation  11/13/18   mini reassess on 2/7   Authorization Type  U[employer]     Authorization Time Period  60 visit limit, 0 used as of 09/14/2018; $17 copay    Authorization - Visit Number  5    Authorization - Number of Visits  60    Activity Tolerance  Patient tolerated treatment well    Behavior During Therapy  [facility] for tasks assessed/performed     time in:  323 Time out:  1030 Total time 42 minutes   Past Medical History:  Diagnosis Date  . Cancer ([place])    kidney (L)  . Chronic kidney disease 2017   left renal mass  . Diabetes mellitus without complication ([place])   . Hypertension     Past Surgical History:  Procedure Laterality Date  . COLONOSCOPY  2012  . COLONOSCOPY N/A 09/03/2016   Procedure: COLONOSCOPY;  Surgeon: Rogene Houston, MD;  [facility];  Service: Endoscopy;  Laterality: N/A;  730  . KNEE ARTHROSCOPY  1998    right   . LAPAROSCOPIC NEPHRECTOMY Left 10/10/2015   Procedure: LAPAROSCOPIC RADICAL NEPHRECTOMY;  Surgeon: Raynelle Bring, MD;  [facility];  Service: Urology;  Laterality: Left;    There were no vitals filed for this visit.  Subjective Assessment - 09/26/18 1538    Subjective   S:  I still cant sleep in my bed     Pain Score  4     Pain Location  Shoulder    Pain Orientation  Left         [facility] OT Assessment - 09/26/18 0001      Assessment   Medical Diagnosis  s/p left RC repair      Precautions   Precautions  Shoulder    Type of Shoulder Precautions  See protocol: No abduction until week 12 (3/30). Phase I  weeks 1-4 (1/6-2/3): supine P/ROM-flexion to 90, remaining to tolerance. Isometrics. Phase II weeks 5-8 (2/4-2/24): progress P/ROM to full, pulleys, AA/ROM. Phase III weeks 9-12 (2/25-3/23): A/ROM, theraband, light resistive exercises. Phase IV weeks 13+ (3/24): progress resistive strengthening to tolerance.     Shoulder Interventions  Shoulder sling/immobilizer;Shoulder abduction pillow;At all times               OT Treatments/Exercises (OP) - 09/26/18 0001      Exercises   Exercises  Shoulder      Shoulder Exercises: Supine   Protraction  PROM;10 reps    Horizontal ABduction  PROM;10 reps;Limitations    Horizontal ABduction Limitations  adduction only     External Rotation  PROM;10 reps    Internal Rotation  PROM;10 reps    Flexion  PROM;10 reps;Limitations    Flexion Limitations  to 90 per protocol    ABduction  Limitations    ABduction Limitations  no abduction x 12 weeks per protocol    Other Supine Exercises  bridges 10 X       Shoulder Exercises: Seated   Elevation  AROM;12 reps  Extension  AROM;10 reps    Row  AROM;12 reps      Shoulder Exercises: Therapy Ball   Flexion  15 reps      Shoulder Exercises: ROM/Strengthening   Anterior Glide  3x5"    Caudal Glide  3x5"      Shoulder Exercises: Isometric Strengthening   Flexion  Supine;5X5"    Extension  Supine;5X5"    External Rotation  Supine;5X5"    Internal Rotation  Supine;5X5"    ADduction  Supine;5X5"      Manual Therapy   Manual Therapy  Myofascial release    Manual therapy comments  completed separately from therapeutic exercises    Myofascial Release  myofascial release and manual techniques to left upper arm, trapezius, and scapularis regions to decrease pain and fascial restrictions and increase joint range of motion             OT Education - 09/26/18 1538    Education Details  educated patient to wear sling while lying down and to place pillow under upper arm to assist with alignment  in bed, patient to attempt tonight    Person(s) Educated  Patient    Methods  Explanation    Comprehension  Verbalized understanding       OT Short Term Goals - 09/16/18 1027      OT SHORT TERM GOAL #1   Title  Pt will be provided with and educated on HEP to improve functional use of LUE during ADL completion.     Time  4    Period  Weeks    Status  On-going      OT SHORT TERM GOAL #2   Title  Pt will increase LUE P/ROM to [facility] to improve ability to reach for items at table height.     Time  4    Period  Weeks    Status  On-going      OT SHORT TERM GOAL #3   Title  Pt will decrease pain in LUE to 5/10 to improve ability use sleep for minimum of 4 consecutive hours.     Time  4    Period  Weeks    Status  On-going        OT Long Term Goals - 09/16/18 1027      OT LONG TERM GOAL #1   Title  Pt will return to highest level of functioning using LUE as non-dominant during ADL and work task completion.     Time  8    Period  Weeks    Status  On-going      OT LONG TERM GOAL #2   Title  Pt will decrease LUE fascial restrictions to minimal amounts or less to improve mobility required for functional reaching tasks.     Time  8    Period  Weeks    Status  On-going      OT LONG TERM GOAL #3   Title  Pt will decrease pain in LUE to 3/10 or less to improve ability to use LUE for dressing tasks.     Time  8    Period  Weeks    Status  On-going      OT LONG TERM GOAL #4   Title  Pt will increase LUE A/ROM to [facility] to improve ability to reach overhead and behind back during ADL completion.     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     Time  8    Period  Weeks    Status  On-going            Plan - 09/26/18 1540    Clinical Impression Statement  A:  Patient able to tolerate flexion passive range of  motion to protocol limits, external rotation is more painful and restricted.  increased supine isometrics to 5 X 5".      Plan  P:  Continue to improve external rotation p/rom to protocol limitations.  increase ball stretches and add passive stretch for external rotation.        Patient will benefit from skilled therapeutic intervention in order to improve the following deficits and impairments:  Decreased range of motion, Impaired flexibility, Decreased activity tolerance, Increased fascial restrictions, Impaired UE functional use, Decreased strength  Visit Diagnosis: Acute pain of left shoulder  Other symptoms and signs involving the musculoskeletal system  Stiffness of left shoulder, not elsewhere classified    Problem List Patient Active Problem List   Diagnosis Date Noted  . History of colonic polyps 05/18/2016  . 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 Biceps tendinitis of both shoulders 10/23/2014  . Right anterior knee pain 10/23/2014  . 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 Left shoulder pain 04/12/2013    Vangie Bicker, [place], OTR/L [phone]  09/26/2018, 3:43 PM  [place] [address] [place], [place], [address] Phone: [phone]   Fax:  [phone]  Name: Elek Holderness. MRN: [id number] Date of Birth: [birth year]

## 2018-09-28 ENCOUNTER — Encounter (HOSPITAL_COMMUNITY): Payer: Self-pay | Admitting: Occupational Therapy

## 2018-09-28 ENCOUNTER — Ambulatory Visit (HOSPITAL_COMMUNITY): Payer: 59 | Admitting: Occupational Therapy

## 2018-09-28 DIAGNOSIS — M25512 Pain in left shoulder: Secondary | ICD-10-CM

## 2018-09-28 DIAGNOSIS — R29898 Other symptoms and signs involving the musculoskeletal system: Secondary | ICD-10-CM

## 2018-09-28 DIAGNOSIS — M25612 Stiffness of left shoulder, not elsewhere classified: Secondary | ICD-10-CM

## 2018-09-28 NOTE — Therapy (Signed)
[place], [place], [address] Phone: [phone]   Fax:  [phone]  Occupational Therapy Treatment  Patient Details  Name: Douglas Robertson. MRN: [id number] Date of Birth: 22-Jan-1966 Referring Provider (OT): Dr. Netta Cedars   Encounter Date: 09/28/2018  OT End of Session - 09/28/18 0941    Visit Number  6    Number of Visits  16    Date for OT Re-Evaluation  11/13/18   mini reassess on 2/7   Authorization Type  [employer]     Authorization Time Period  60 visit limit, 0 used as of 09/14/2018; $17 copay    Authorization - Visit Number  6    Authorization - Number of Visits  66    OT Start Time  0850    OT Stop Time  0935    OT Time Calculation (min)  45 min    Activity Tolerance  Patient tolerated treatment well    Behavior During Therapy  [facility] for tasks assessed/performed       Past Medical History:  Diagnosis Date  . Cancer ([place])    kidney (L)  . Chronic kidney disease 2017   left renal mass  . Diabetes mellitus without complication ([place])   . Hypertension     Past Surgical History:  Procedure Laterality Date  . COLONOSCOPY  2012  . COLONOSCOPY N/A 09/03/2016   Procedure: COLONOSCOPY;  Surgeon: Rogene Houston, MD;  [facility];  Service: Endoscopy;  Laterality: N/A;  730  . KNEE ARTHROSCOPY  1998    right   . LAPAROSCOPIC NEPHRECTOMY Left 10/10/2015   Procedure: LAPAROSCOPIC RADICAL NEPHRECTOMY;  Surgeon: Raynelle Bring, MD;  [facility];  Service: Urology;  Laterality: Left;    There were no vitals filed for this visit.  Subjective Assessment - 09/28/18 0855    Subjective   S: I just can't get comfortable at night.     Currently in Pain?  Yes    Pain Score  4     Pain Location  Shoulder    Pain Orientation  Left    Pain Descriptors / Indicators  Aching;Constant;Sore    Pain Type  Acute pain    Pain Radiating Towards  elbow to shoulder     Pain Onset  In the past 7 days    Pain  Frequency  Constant    Aggravating Factors   movement    Pain Relieving Factors  pain medication, ice    Effect of Pain on Daily Activities  unable to use LUE during ADLs.     Multiple Pain Sites  No         [facility] OT Assessment - 09/28/18 0851      Assessment   Medical Diagnosis  s/p left RC repair      Precautions   Precautions  Shoulder    Type of Shoulder Precautions  See protocol: No abduction until week 12 (3/30). Phase I weeks 1-4 (1/6-2/3): supine P/ROM-flexion to 90, remaining to tolerance. Isometrics. Phase II weeks 5-8 (2/4-2/24): progress P/ROM to full, pulleys, AA/ROM. Phase III weeks 9-12 (2/25-3/23): A/ROM, theraband, light resistive exercises. Phase IV weeks 13+ (3/24): progress resistive strengthening to tolerance.     Shoulder Interventions  Shoulder sling/immobilizer;Shoulder abduction pillow;At all times               OT Treatments/Exercises (OP) - 09/28/18 0857      Exercises   Exercises  Shoulder  Shoulder Exercises: Supine   Protraction  PROM;10 reps    Horizontal ABduction  PROM;10 reps;Limitations    Horizontal ABduction Limitations  adduction only     External Rotation  PROM;10 reps    Internal Rotation  PROM;10 reps    Flexion  PROM;10 reps;Limitations    Flexion Limitations  to 90 per protocol    ABduction  Limitations    ABduction Limitations  no abduction x 12 weeks per protocol      Shoulder Exercises: Seated   Elevation  AROM;15 reps    Extension  AROM;15 reps    Row  AROM;15 reps    Other Seated Exercises  scapular depression A/ROM 15X      Shoulder Exercises: Therapy Ball   Flexion  15 reps      Shoulder Exercises: ROM/Strengthening   Anterior Glide  3x10"    Caudal Glide  3x10"      Shoulder Exercises: Isometric Strengthening   Flexion  --   standing 3x10"   Extension  --   standing 3x10"   External Rotation  --   standing 3x10"   Internal Rotation  --   standing 3x10"   ABduction  --   standing 3x10"    ADduction  --   standing 3x10"     Manual Therapy   Manual Therapy  Myofascial release    Manual therapy comments  completed separately from therapeutic exercises    Myofascial Release  myofascial release and manual techniques to left upper arm, trapezius, and scapularis regions to decrease pain and fascial restrictions and increase joint range of motion             OT Education - 09/28/18 0915    Education Details  isometrics    Person(s) Educated  Patient    Methods  Explanation;Demonstration;Handout    Comprehension  Verbalized understanding;Returned demonstration       OT Short Term Goals - 09/16/18 1027      OT SHORT TERM GOAL #1   Title  Pt will be provided with and educated on HEP to improve functional use of LUE during ADL completion.     Time  4    Period  Weeks    Status  On-going      OT SHORT TERM GOAL #2   Title  Pt will increase LUE P/ROM to [facility] to improve ability to reach for items at table height.     Time  4    Period  Weeks    Status  On-going      OT SHORT TERM GOAL #3   Title  Pt will decrease pain in LUE to 5/10 to improve ability use sleep for minimum of 4 consecutive hours.     Time  4    Period  Weeks    Status  On-going        OT Long Term Goals - 09/16/18 1027      OT LONG TERM GOAL #1   Title  Pt will return to highest level of functioning using LUE as non-dominant during ADL and work task completion.     Time  8    Period  Weeks    Status  On-going      OT LONG TERM GOAL #2   Title  Pt will decrease LUE fascial restrictions to minimal amounts or less to improve mobility required for functional reaching tasks.     Time  8    Period  Weeks  Status  On-going      OT LONG TERM GOAL #3   Title  Pt will decrease pain in LUE to 3/10 or less to improve ability to use LUE for dressing tasks.     Time  8    Period  Weeks    Status  On-going      OT LONG TERM GOAL #4   Title  Pt will increase LUE A/ROM to [facility] to improve ability to  reach overhead and behind back during ADL completion.     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     Time  8    Period  Weeks    Status  On-going            Plan - 09/28/18 4401    Clinical Impression Statement  A: Pt reports MD visit went well last week, MD is pleased with his progress so far. Pt continues to be unable to find a comfortable position for sleep. Continued with manual therapy to address muscle knot palpated in anterior deltoid as well as additional fascial restrictions in LUE. Progressed to standing isometrics for 10" holds and updated for HEP. Pt continues to have more pain with er however was able to tolerate passive stretching to approximately 35-40 degrees today. Verbal cuing for form and technique during exercises.     Plan  P: Continue working to improve er P/ROM tolerance, follow up on HEP, resume table slide for IR/er       Patient will benefit from skilled therapeutic intervention in order to improve the following deficits and impairments:  Decreased range of motion, Impaired flexibility, Decreased activity tolerance, Increased fascial restrictions, Impaired UE functional use, Decreased strength  Visit Diagnosis: Acute pain of left shoulder  Other symptoms and signs involving the musculoskeletal system  Stiffness of left shoulder, not elsewhere classified    Problem List Patient Active Problem List   Diagnosis Date Noted  . History of colonic polyps 05/18/2016  . 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 Biceps tendinitis of both shoulders 10/23/2014  . Right anterior knee pain 10/23/2014  . 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 Left shoulder pain 04/12/2013   Guadelupe Sabin, OTR/L  [phone] 09/28/2018, 9:45 AM  [place], [place], [address] Phone: [phone]   Fax:  [phone]  Name: Brannan Cassedy. MRN: [id number] Date of Birth: 10/30/1965

## 2018-09-28 NOTE — Patient Instructions (Signed)
Shoulder Isometric Exercises  Complete 5x each, holding for 5-10 seconds. Complete 2-3x/day.   1) Shoulder Flexion Standing up, place a towel between your arm/wrist and the wall. Press your arm forward into the wall gently, without moving your shoulder.     2) Shoulder Extension Standing with your back against a wall. Bend elbow to 90 degrees and place a towel between your bent elbow and the wall.  Press your elbow back into the wall gently, without moving your shoulder.        3) Shoulder Internal Rotation Standing just behind a doorway, place your affected hand inside the doorway so that your palm is facing the door jam.  Keeping your elbow at your side and bend to 90 degrees, slowly press your hand into the door jam.  Do not move shoulder or allow elbow to leave your side.     4) Shoulder External Rotation Standing in a doorway or to the side of a wall, Place the affected hand/wrist against the wall.  Keeping your elbow bent at 90 degrees and close to your body, press your hand/wrist outward against the wall.  Do not move your shoulder or elbow.          5) Shoulder Adduction Standing just behind a doorway, place a towel between the inside of your elbow and the doorframe. Keeping your elbow bent at 90 degrees and close to your side, gently press your elbow into the doorframe.  Do not move shoulder.     6) Shoulder Abduction Standing to the side of a wall, place a towel between your arm and the wall.  Press your arm into the wall gently.  Do not move your shoulder.

## 2018-10-03 ENCOUNTER — Ambulatory Visit (HOSPITAL_COMMUNITY): Payer: 59 | Admitting: Specialist

## 2018-10-03 ENCOUNTER — Encounter (HOSPITAL_COMMUNITY): Payer: Self-pay | Admitting: Specialist

## 2018-10-03 DIAGNOSIS — M25612 Stiffness of left shoulder, not elsewhere classified: Secondary | ICD-10-CM

## 2018-10-03 DIAGNOSIS — R29898 Other symptoms and signs involving the musculoskeletal system: Secondary | ICD-10-CM

## 2018-10-03 DIAGNOSIS — M25512 Pain in left shoulder: Secondary | ICD-10-CM

## 2018-10-03 NOTE — Therapy (Signed)
[place], [place], [address] Phone: [phone]   Fax:  [phone]  Occupational Therapy Treatment  Patient Details  Name: Douglas Robertson. MRN: [id number] Date of Birth: [birth year] Referring Provider (OT): Dr. Netta Cedars   Encounter Date: 10/03/2018  OT End of Session - 10/03/18 1324    Visit Number  7    Number of Visits  16    Date for OT Re-Evaluation  11/13/18   mini reasess on 10/14/2018   Authorization Type  [employer]     Authorization Time Period  60 visit limit, 0 used as of 09/14/2018; $17 copay    Authorization - Visit Number  7    Authorization - Number of Visits  40    OT Start Time  0945    OT Stop Time  1030    OT Time Calculation (min)  45 min    Activity Tolerance  Patient tolerated treatment well    Behavior During Therapy  [facility] for tasks assessed/performed       Past Medical History:  Diagnosis Date  . Cancer ([place])    kidney (L)  . Chronic kidney disease 2017   left renal mass  . Diabetes mellitus without complication ([place])   . Hypertension     Past Surgical History:  Procedure Laterality Date  . COLONOSCOPY  2012  . COLONOSCOPY N/A 09/03/2016   Procedure: COLONOSCOPY;  Surgeon: Rogene Houston, MD;  [facility];  Service: Endoscopy;  Laterality: N/A;  730  . KNEE ARTHROSCOPY  1998    right   . LAPAROSCOPIC NEPHRECTOMY Left 10/10/2015   Procedure: LAPAROSCOPIC RADICAL NEPHRECTOMY;  Surgeon: Raynelle Bring, MD;  Location: [facility];  Service: Urology;  Laterality: Left;    There were no vitals filed for this visit.  Subjective Assessment - 10/03/18 1324    Subjective   S:  Sometimes my arm is just so sore    Currently in Pain?  Yes    Pain Score  4     Pain Orientation  Left    Pain Descriptors / Indicators  Aching    Pain Type  Acute pain    Pain Onset  1 to 4 weeks ago         [facility] OT Assessment - 10/03/18 0001      Assessment   Medical Diagnosis  s/p left RC  repair      Precautions   Precautions  Shoulder    Type of Shoulder Precautions  See protocol: No abduction until week 12 (3/30). Phase I weeks 1-4 (1/6-2/3): supine P/ROM-flexion to 90, remaining to tolerance. Isometrics. Phase II weeks 5-8 (2/4-2/24): progress P/ROM to full, pulleys, AA/ROM. Phase III weeks 9-12 (2/25-3/23): A/ROM, theraband, light resistive exercises. Phase IV weeks 13+ (3/24): progress resistive strengthening to tolerance.     Shoulder Interventions  Shoulder sling/immobilizer;Shoulder abduction pillow;At all times               OT Treatments/Exercises (OP) - 10/03/18 0001      Exercises   Exercises  Shoulder      Shoulder Exercises: Supine   Protraction  PROM;10 reps    Horizontal ABduction  PROM;10 reps;Limitations    Horizontal ABduction Limitations  adduction only     External Rotation  PROM;10 reps    Internal Rotation  PROM;10 reps    Flexion  PROM;10 reps;Limitations    Flexion Limitations  to 90 per protocol  ABduction Limitations  no abduction x 12 weeks per protocol    Other Supine Exercises  bridges X 20 repetitions       Shoulder Exercises: Seated   Elevation  AROM;15 reps    Extension  AROM;15 reps    Row  AROM;15 reps    External Rotation  AAROM;10 reps    Internal Rotation  AAROM;10 reps      Shoulder Exercises: Therapy Ball   Flexion  20 reps      Shoulder Exercises: ROM/Strengthening   Anterior Glide  5 X 10"    Caudal Glide  5 X 10"      Shoulder Exercises: Isometric Strengthening   Flexion  Supine   3 X 10"    Extension  Supine   3 X 10"    External Rotation  Supine   3 X 10"    Internal Rotation  Supine   3 X 10"    ABduction  Supine   3 X 10"    ADduction  Supine   3 X 10"      Shoulder Exercises: Stretch   Table Stretch - External Rotation  5 reps;10 seconds      Manual Therapy   Manual Therapy  Myofascial release    Manual therapy comments  completed separately from therapeutic exercises    Myofascial  Release  myofascial release and manual techniques to left upper arm, trapezius, and scapularis regions to decrease pain and fascial restrictions and increase joint range of motion               OT Short Term Goals - 09/16/18 1027      OT SHORT TERM GOAL #1   Title  Pt will be provided with and educated on HEP to improve functional use of LUE during ADL completion.     Time  4    Period  Weeks    Status  On-going      OT SHORT TERM GOAL #2   Title  Pt will increase LUE P/ROM to [facility] to improve ability to reach for items at table height.     Time  4    Period  Weeks    Status  On-going      OT SHORT TERM GOAL #3   Title  Pt will decrease pain in LUE to 5/10 to improve ability use sleep for minimum of 4 consecutive hours.     Time  4    Period  Weeks    Status  On-going        OT Long Term Goals - 09/16/18 1027      OT LONG TERM GOAL #1   Title  Pt will return to highest level of functioning using LUE as non-dominant during ADL and work task completion.     Time  8    Period  Weeks    Status  On-going      OT LONG TERM GOAL #2   Title  Pt will decrease LUE fascial restrictions to minimal amounts or less to improve mobility required for functional reaching tasks.     Time  8    Period  Weeks    Status  On-going      OT LONG TERM GOAL #3   Title  Pt will decrease pain in LUE to 3/10 or less to improve ability to use LUE for dressing tasks.     Time  8    Period  Weeks    Status  On-going      OT LONG TERM GOAL #4   Title  Pt will increase LUE A/ROM to [facility] to improve ability to reach overhead and behind back during ADL completion.     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     Time  8    Period  Weeks    Status  On-going            Plan - 10/03/18 1325    Clinical Impression  Statement  A:  Patient reeducated on isometrics, patient using too much force with exercises, educated patient on contraction versus movement and force of 2 fingers versus entire are movement.  Patient completed with increased accuracy after education from therapist.  Educated patient on table stretch for external rotation, with movement through stretch to the point stretching is felt and not past.      Plan  P:  follow up on HEP addition of shoulder external rotation stretch.  resume isometrics in standing versus supine.        Patient will benefit from skilled therapeutic intervention in order to improve the following deficits and impairments:  Decreased range of motion, Impaired flexibility, Decreased activity tolerance, Increased fascial restrictions, Impaired UE functional use, Decreased strength  Visit Diagnosis: Acute pain of left shoulder  Other symptoms and signs involving the musculoskeletal system  Stiffness of left shoulder, not elsewhere classified    Problem List Patient Active Problem List   Diagnosis Date Noted  . History of colonic polyps 05/18/2016  . 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 Biceps tendinitis of both shoulders 10/23/2014  . Right anterior knee pain 10/23/2014  . 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 Left shoulder pain 04/12/2013   Vangie Bicker, [address], OTR/L [phone]  10/03/2018, 1:29 PM  [place] [address] [place], [place], [address] Phone: [phone]   Fax:  [phone]  Name: Kelwin Gibler. MRN: [id number] Date of Birth: 09-09-1965

## 2018-10-03 NOTE — Patient Instructions (Signed)
Table Stretch: External Rotation    Sit with left arm on table. Lean forward until a stretch is felt in shoulder. Hold _10___ seconds. Repeat ___5_ times. Do _2___ sessions per day.  http://[email or web address].exer.us/105   Copyright  VHI. All rights reserved.

## 2018-10-05 ENCOUNTER — Ambulatory Visit (HOSPITAL_COMMUNITY): Payer: 59 | Admitting: Occupational Therapy

## 2018-10-05 ENCOUNTER — Encounter (HOSPITAL_COMMUNITY): Payer: Self-pay | Admitting: Occupational Therapy

## 2018-10-05 DIAGNOSIS — R29898 Other symptoms and signs involving the musculoskeletal system: Secondary | ICD-10-CM

## 2018-10-05 DIAGNOSIS — M25612 Stiffness of left shoulder, not elsewhere classified: Secondary | ICD-10-CM

## 2018-10-05 DIAGNOSIS — M25512 Pain in left shoulder: Secondary | ICD-10-CM | POA: Diagnosis not present

## 2018-10-05 NOTE — Therapy (Signed)
[place], [place], [address] Phone: [phone]   Fax:  [phone]  Occupational Therapy Treatment  Patient Details  Name: Douglas Robertson. MRN: [id number] Date of Birth: 10/18/1965 Referring Provider (OT): Dr. Netta Cedars   Encounter Date: 10/05/2018  OT End of Session - 10/05/18 1027    Visit Number  8    Number of Visits  16    Date for OT Re-Evaluation  11/13/18   mini reasess on 10/14/2018   Authorization Type  [employer]     Authorization Time Period  60 visit limit, 0 used as of 09/14/2018; $17 copay    Authorization - Visit Number  8    Authorization - Number of Visits  20    OT Start Time  0950    OT Stop Time  1028    OT Time Calculation (min)  38 min    Activity Tolerance  Patient tolerated treatment well    Behavior During Therapy  [facility] for tasks assessed/performed       Past Medical History:  Diagnosis Date  . Cancer ([place])    kidney (L)  . Chronic kidney disease 2017   left renal mass  . Diabetes mellitus without complication ([place])   . Hypertension     Past Surgical History:  Procedure Laterality Date  . COLONOSCOPY  2012  . COLONOSCOPY N/A 09/03/2016   Procedure: COLONOSCOPY;  Surgeon: Rogene Houston, MD;  [facility];  Service: Endoscopy;  Laterality: N/A;  730  . KNEE ARTHROSCOPY  1998    right   . LAPAROSCOPIC NEPHRECTOMY Left 10/10/2015   Procedure: LAPAROSCOPIC RADICAL NEPHRECTOMY;  Surgeon: Raynelle Bring, MD;  [facility];  Service: Urology;  Laterality: Left;    There were no vitals filed for this visit.  Subjective Assessment - 10/05/18 0952    Subjective   S: Douglas Robertson was a bad day.     Currently in Pain?  Yes    Pain Score  3     Pain Location  Shoulder    Pain Orientation  Left    Pain Descriptors / Indicators  Aching;Sore    Pain Type  Acute pain    Pain Radiating Towards  elbow to shoulder    Pain Onset  1 to 4 weeks ago    Pain Frequency  Constant    Aggravating Factors   movement    Pain Relieving Factors  ice, pain medication    Effect of Pain on Daily Activities  unable to use LUE during ADLs    Multiple Pain Sites  No         [facility] OT Assessment - 10/05/18 0952      Assessment   Medical Diagnosis  s/p left RC repair      Precautions   Precautions  Shoulder    Type of Shoulder Precautions  See protocol: No abduction until week 12 (3/30). Phase I weeks 1-4 (1/6-2/3): supine P/ROM-flexion to 90, remaining to tolerance. Isometrics. Phase II weeks 5-8 (2/4-2/24): progress P/ROM to full, pulleys, AA/ROM. Phase III weeks 9-12 (2/25-3/23): A/ROM, theraband, light resistive exercises. Phase IV weeks 13+ (3/24): progress resistive strengthening to tolerance.     Shoulder Interventions  Shoulder sling/immobilizer;Shoulder abduction pillow;At all times               OT Treatments/Exercises (OP) - 10/05/18 0954      Exercises   Exercises  Shoulder  Shoulder Exercises: Supine   Protraction  PROM;10 reps    Horizontal ABduction  PROM;10 reps;Limitations    Horizontal ABduction Limitations  adduction only     External Rotation  PROM;10 reps    Internal Rotation  PROM;10 reps    Flexion  PROM;10 reps;Limitations    Flexion Limitations  to 90 per protocol    ABduction  Limitations    ABduction Limitations  no abduction x 12 weeks per protocol      Shoulder Exercises: Seated   Elevation  AROM;15 reps    Extension  AROM;15 reps    Row  AROM;15 reps    External Rotation  AAROM;10 reps    Internal Rotation  AAROM;10 reps    Other Seated Exercises  scapular depression A/ROM 15X      Shoulder Exercises: Therapy Ball   Flexion  20 reps      Shoulder Exercises: ROM/Strengthening   Thumb Tacks  1' low thumb tacks    Anterior Glide  5 X 10"    Caudal Glide  5 X 10"    Prot/Ret//Elev/Dep  1' low level      Shoulder Exercises: Isometric Strengthening   Flexion  --    standing 3X10"   Extension  --    standing 3X10"    External Rotation  --    standing 3X10"   Internal Rotation  --    standing 3X10"   ABduction  --    standing 3X10"   ADduction  --    standing 3X10"     Manual Therapy   Manual Therapy  Myofascial release    Manual therapy comments  completed separately from therapeutic exercises    Myofascial Release  myofascial release and manual techniques to left upper arm, trapezius, and scapularis regions to decrease pain and fascial restrictions and increase joint range of motion               OT Short Term Goals - 09/16/18 1027      OT SHORT TERM GOAL #1   Title  Pt will be provided with and educated on HEP to improve functional use of LUE during ADL completion.     Time  4    Period  Weeks    Status  On-going      OT SHORT TERM GOAL #2   Title  Pt will increase LUE P/ROM to [facility] to improve ability to reach for items at table height.     Time  4    Period  Weeks    Status  On-going      OT SHORT TERM GOAL #3   Title  Pt will decrease pain in LUE to 5/10 to improve ability use sleep for minimum of 4 consecutive hours.     Time  4    Period  Weeks    Status  On-going        OT Long Term Goals - 09/16/18 1027      OT LONG TERM GOAL #1   Title  Pt will return to highest level of functioning using LUE as non-dominant during ADL and work task completion.     Time  8    Period  Weeks    Status  On-going      OT LONG TERM GOAL #2   Title  Pt will decrease LUE fascial restrictions to minimal amounts or less to improve mobility required for functional reaching tasks.     Time  8  Period  Weeks    Status  On-going      OT LONG TERM GOAL #3   Title  Pt will decrease pain in LUE to 3/10 or less to improve ability to use LUE for dressing tasks.     Time  8    Period  Weeks    Status  On-going      OT LONG TERM GOAL #4   Title  Pt will increase LUE A/ROM to [facility] to improve ability to reach overhead and behind back during ADL completion.     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     Time  8    Period  Weeks    Status  On-going            Plan - 10/05/18 1026    Clinical Impression Statement  A: Continued with manual therapy to address fascial restrictions. Good completion of isometrics today, pt decreased force to perform appropriately. Added low level thumb tacks and prot/ret/elev/dep today. Verbal cuing for form and technique.     Plan  P: Progress to phase II of protocol, completing P/ROM to tolerance and adding AA/ROM as able       Patient will benefit from skilled therapeutic intervention in order to improve the following deficits and impairments:  Decreased range of motion, Impaired flexibility, Decreased activity tolerance, Increased fascial restrictions, Impaired UE functional use, Decreased strength  Visit Diagnosis: Acute pain of left shoulder  Other symptoms and signs involving the musculoskeletal system  Stiffness of left shoulder, not elsewhere classified    Problem List Patient Active Problem List   Diagnosis Date Noted  . History of colonic polyps 05/18/2016  . 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 Biceps tendinitis of both shoulders 10/23/2014  . Right anterior knee pain 10/23/2014  . 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 Left shoulder pain 04/12/2013    Guadelupe Sabin, OTR/L  [phone] 10/05/2018, 10:29 AM  [place] [address] [place], [place], [address] Phone: [phone]   Fax:  [phone]  Name: Douglas Robertson. MRN: [id number] Date of Birth: 1966-06-10

## 2018-10-10 ENCOUNTER — Encounter (HOSPITAL_COMMUNITY): Payer: Self-pay | Admitting: Specialist

## 2018-10-11 ENCOUNTER — Ambulatory Visit (HOSPITAL_COMMUNITY): Payer: 59 | Attending: Orthopedic Surgery | Admitting: Occupational Therapy

## 2018-10-11 ENCOUNTER — Encounter (HOSPITAL_COMMUNITY): Payer: Self-pay | Admitting: Occupational Therapy

## 2018-10-11 DIAGNOSIS — M25612 Stiffness of left shoulder, not elsewhere classified: Secondary | ICD-10-CM | POA: Diagnosis not present

## 2018-10-11 DIAGNOSIS — R29898 Other symptoms and signs involving the musculoskeletal system: Secondary | ICD-10-CM | POA: Diagnosis not present

## 2018-10-11 DIAGNOSIS — M25512 Pain in left shoulder: Secondary | ICD-10-CM

## 2018-10-11 NOTE — Patient Instructions (Signed)
Perform each exercise ___10-15_____ reps. 2-3x days.   Protraction   Start by holding a wand or cane at chest height.  Next, slowly push the wand outwards in front of your body so that your elbows become fully straightened. Then, return to the original position.     Shoulder FLEXION   In the standing position, hold a wand/cane with both arms, palms down on both sides. Raise up the wand/cane allowing your unaffected arm to perform most of the effort. Your affected arm should be partially relaxed.      Internal/External ROTATION  In the standing position, hold a wand/cane with both hands keeping your elbows bent. Move your arms and wand/cane to one side.  Your affected arm should be partially relaxed while your unaffected arm performs most of the effort.        Horizontal Abduction/Adduction    Straight arms holding cane at shoulder height, bring cane to right, center, left. Repeat starting to left.   Copyright  VHI. All rights reserved.

## 2018-10-11 NOTE — Therapy (Addendum)
[facility] [address] [place], [place], [address] Phone: [phone]   Fax:  [phone]  Occupational Therapy Treatment (mini-reassessment)  Patient Details  Name: Douglas Robertson. MRN: [id number] Date of Birth: October 11, 1965 Referring Provider (OT): Dr. Netta Cedars   Progress Note Reporting Period 09/14/2018 to 10/11/2018  See note below for Objective Data and Assessment of Progress/Goals.       Encounter Date: 10/11/2018  OT End of Session - 10/11/18 0939    Visit Number  9    Number of Visits  16    Date for OT Re-Evaluation  11/13/18    Authorization Type  [employer]     Authorization Time Period  60 visit limit, 0 used as of 09/14/2018; $17 copay    Authorization - Visit Number  9    Authorization - Number of Visits  93    OT Start Time  0856    OT Stop Time  0938    OT Time Calculation (min)  42 min    Activity Tolerance  Patient tolerated treatment well    Behavior During Therapy  [facility] for tasks assessed/performed       Past Medical History:  Diagnosis Date  . Cancer ([place])    kidney (L)  . Chronic kidney disease 2017   left renal mass  . Diabetes mellitus without complication ([place])   . Hypertension     Past Surgical History:  Procedure Laterality Date  . COLONOSCOPY  2012  . COLONOSCOPY N/A 09/03/2016   Procedure: COLONOSCOPY;  Surgeon: Rogene Houston, MD;  [facility];  Service: Endoscopy;  Laterality: N/A;  730  . KNEE ARTHROSCOPY  1998    right   . LAPAROSCOPIC NEPHRECTOMY Left 10/10/2015   Procedure: LAPAROSCOPIC RADICAL NEPHRECTOMY;  Surgeon: Raynelle Bring, MD;  [facility]: [facility];  Service: Urology;  Laterality: Left;    There were no vitals filed for this visit.  Subjective Assessment - 10/11/18 0856    Subjective   S: I slept in the bed for the first time last night.     Currently in Pain?  Yes    Pain Score  1     Pain Location  Shoulder    Pain Orientation  Left    Pain Descriptors /  Indicators  Aching;Sore    Pain Type  Acute pain    Pain Radiating Towards  elbow to shoulder    Pain Onset  1 to 4 weeks ago    Pain Frequency  Intermittent    Aggravating Factors   movement    Pain Relieving Factors   ice, pain medication    Effect of Pain on Daily Activities  unable to use LUE during ADLs    Multiple Pain Sites  No         [facility] OT Assessment - 10/11/18 0855      Assessment   Medical Diagnosis  s/p left RC repair      Precautions   Precautions  Shoulder    Type of Shoulder Precautions  See protocol: No abduction until week 12 (3/30). Phase I weeks 1-4 (1/6-2/3): supine P/ROM-flexion to 90, remaining to tolerance. Isometrics. Phase II weeks 5-8 (2/4-2/24): progress P/ROM to full, pulleys, AA/ROM. Phase III weeks 9-12 (2/25-3/23): A/ROM, theraband, light resistive exercises. Phase IV weeks 13+ (3/24): progress resistive strengthening to tolerance.     Shoulder Interventions  Shoulder sling/immobilizer;Shoulder abduction pillow;At all times      Observation/Other Assessments  Focus on Therapeutic Outcomes (FOTO)   48/100      Palpation   Palpation comment  minimal fascial restrictions in upper arm, unable to assess further due to bandage      AROM   Overall AROM   Unable to assess;Due to precautions      PROM   Overall PROM Comments  Assessed supine, er/IR adducted    PROM Assessment Site  Shoulder    Right/Left Shoulder  Left    Left Shoulder Flexion  141 Degrees   110 previous   Left Shoulder ABduction  180 Degrees   115 previous   Left Shoulder Internal Rotation  90 Degrees   same as previous   Left Shoulder External Rotation  58 Degrees   0 previous     Strength   Overall Strength  Unable to assess;Due to precautions               OT Treatments/Exercises (OP) - 10/11/18 0856      Exercises   Exercises  Shoulder      Shoulder Exercises: Supine   Protraction  PROM;5 reps;AAROM;10 reps    Horizontal ABduction  PROM;5 reps;AAROM;10  reps    Horizontal ABduction Limitations  adduction only     External Rotation  PROM;5 reps;AAROM;10 reps    Internal Rotation  PROM;5 reps;AAROM;10 reps    Flexion  PROM;5 reps;AAROM;10 reps    ABduction  Limitations    ABduction Limitations  no abduction x 12 weeks per protocol      Shoulder Exercises: Standing   Protraction  AAROM;10 reps    Horizontal ABduction  AAROM;10 reps;Limitations    Horizontal ABduction Limitations  adduction only    External Rotation  AAROM;10 reps    Internal Rotation  AAROM;10 reps    Flexion  AAROM;10 reps    ABduction  Limitations    ABduction Limitations  no abduction x12 weeks      Shoulder Exercises: ROM/Strengthening   Thumb Tacks  1'    Prot/Ret//Elev/Dep  1'      Manual Therapy   Manual Therapy  Myofascial release    Manual therapy comments  completed separately from therapeutic exercises    Myofascial Release  myofascial release and manual techniques to left upper arm, trapezius, and scapularis regions to decrease pain and fascial restrictions and increase joint range of motion             OT Education - 10/11/18 0929    Education Details  AA/ROM exercises-supine and standing, no abduction    Person(s) Educated  Patient    Methods  Explanation;Demonstration;Handout    Comprehension  Verbalized understanding;Returned demonstration       OT Short Term Goals - 09/16/18 1027      OT SHORT TERM GOAL #1   Title  Pt will be provided with and educated on HEP to improve functional use of LUE during ADL completion.     Time  4    Period  Weeks    Status  On-going      OT SHORT TERM GOAL #2   Title  Pt will increase LUE P/ROM to [facility] to improve ability to reach for items at table height.     Time  4    Period  Weeks    Status  On-going      OT SHORT TERM GOAL #3   Title  Pt will decrease pain in LUE to 5/10 to improve ability use sleep for minimum of 4  consecutive hours.     Time  4    Period  Weeks    Status  On-going         OT Long Term Goals - 09/16/18 1027      OT LONG TERM GOAL #1   Title  Pt will return to highest level of functioning using LUE as non-dominant during ADL and work task completion.     Time  8    Period  Weeks    Status  On-going      OT LONG TERM GOAL #2   Title  Pt will decrease LUE fascial restrictions to minimal amounts or less to improve mobility required for functional reaching tasks.     Time  8    Period  Weeks    Status  On-going      OT LONG TERM GOAL #3   Title  Pt will decrease pain in LUE to 3/10 or less to improve ability to use LUE for dressing tasks.     Time  8    Period  Weeks    Status  On-going      OT LONG TERM GOAL #4   Title  Pt will increase LUE A/ROM to [facility] to improve ability to reach overhead and behind back during ADL completion.     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     Time  8    Period  Weeks    Status  On-going            Plan - 10/11/18 9562    Clinical Impression Statement  A: Mini-reassessment completed this session, pt is making great progress towards goals with improvements in fascial restrictions, ROM, and pain level. Progressed to phase II of protocol today, completing P/ROM to tolerance and initiating AA/ROM with ROM to [facility], and continuing with thumb tacks and prot/ret/elev/dep. Verbal cuing required for form and technique, HEP updated.     Plan  P: Follow up on HEP, add pulleys and continue with AA/ROM progressing to full range as tolerated       Patient will benefit from skilled therapeutic intervention in order to improve the following deficits and impairments:  Decreased range of motion, Impaired flexibility, Decreased activity tolerance, Increased fascial restrictions, Impaired UE functional use, Decreased strength  Visit Diagnosis: Acute pain  of left shoulder  Other symptoms and signs involving the musculoskeletal system  Stiffness of left shoulder, not elsewhere classified    Problem List Patient Active Problem List   Diagnosis Date Noted  . History of colonic polyps 05/18/2016  . 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 Biceps tendinitis of both shoulders 10/23/2014  . Right anterior knee pain 10/23/2014  . 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 Left shoulder pain 04/12/2013   Guadelupe Sabin, OTR/L  [phone] 10/11/2018, 9:42 AM  [facility] [address] [place], [place], [address] Phone: [phone]   Fax:  [phone]  Name: Douglas Robertson. MRN: [id number] Date of Birth: [birth year]

## 2018-10-18 ENCOUNTER — Ambulatory Visit (HOSPITAL_COMMUNITY): Payer: 59 | Admitting: Occupational Therapy

## 2018-10-18 ENCOUNTER — Encounter (HOSPITAL_COMMUNITY): Payer: Self-pay | Admitting: Occupational Therapy

## 2018-10-18 DIAGNOSIS — M25512 Pain in left shoulder: Secondary | ICD-10-CM

## 2018-10-18 DIAGNOSIS — M25612 Stiffness of left shoulder, not elsewhere classified: Secondary | ICD-10-CM

## 2018-10-18 DIAGNOSIS — R29898 Other symptoms and signs involving the musculoskeletal system: Secondary | ICD-10-CM

## 2018-10-18 NOTE — Therapy (Signed)
[place], [place], [address] Phone: [phone]   Fax:  [phone]  Occupational Therapy Treatment  Patient Details  Name: Douglas Robertson. MRN: [id number] Date of Birth: 1966-01-08 Referring Provider (OT): Dr. Netta Cedars   Encounter Date: 10/18/2018  OT End of Session - 10/18/18 1152    Visit Number  10    Number of Visits  16    Date for OT Re-Evaluation  11/13/18    Authorization Type  UHC     Authorization Time Period  60 visit limit, 0 used as of 09/14/2018; $17 copay    Authorization - Visit Number  10    Authorization - Number of Visits  60    OT Start Time  1110    OT Stop Time  1150    OT Time Calculation (min)  40 min    Activity Tolerance  Patient tolerated treatment well    Behavior During Therapy  [facility] for tasks assessed/performed       Past Medical History:  Diagnosis Date  . Cancer ([place])    kidney (L)  . Chronic kidney disease 2017   left renal mass  . Diabetes mellitus without complication ([place])   . Hypertension     Past Surgical History:  Procedure Laterality Date  . COLONOSCOPY  2012  . COLONOSCOPY N/A 09/03/2016   Procedure: COLONOSCOPY;  Surgeon: Rogene Houston, MD;  [facility];  Service: Endoscopy;  Laterality: N/A;  730  . KNEE ARTHROSCOPY  1998    right   . LAPAROSCOPIC NEPHRECTOMY Left 10/10/2015   Procedure: LAPAROSCOPIC RADICAL NEPHRECTOMY;  Surgeon: Raynelle Bring, MD;  [facility];  Service: Urology;  Laterality: Left;    There were no vitals filed for this visit.  Subjective Assessment - 10/18/18 1109    Subjective   S: I've been going without the sling more and more.     Currently in Pain?  Yes    Pain Score  3     Pain Location  Shoulder    Pain Orientation  Left    Pain Descriptors / Indicators  Aching;Sore    Pain Type  Acute pain    Pain Radiating Towards  elbow to shoulder    Pain Onset  1 to 4 weeks ago    Pain Frequency  Intermittent     Aggravating Factors   movement    Pain Relieving Factors  ice, rest    Effect of Pain on Daily Activities  max effect on use of LUE during ADLs.    Multiple Pain Sites  No         [facility] OT Assessment - 10/18/18 1109      Assessment   Medical Diagnosis  s/p left RC repair      Precautions   Precautions  Shoulder    Type of Shoulder Precautions  See protocol: No abduction until week 12 (3/30). Phase I weeks 1-4 (1/6-2/3): supine P/ROM-flexion to 90, remaining to tolerance. Isometrics. Phase II weeks 5-8 (2/4-2/24): progress P/ROM to full, pulleys, AA/ROM. Phase III weeks 9-12 (2/25-3/23): A/ROM, theraband, light resistive exercises. Phase IV weeks 13+ (3/24): progress resistive strengthening to tolerance.     Shoulder Interventions  Shoulder sling/immobilizer;Shoulder abduction pillow;At all times               OT Treatments/Exercises (OP) - 10/18/18 1110      Exercises   Exercises  Shoulder  Shoulder Exercises: Supine   Protraction  PROM;5 reps;AAROM;10 reps    Horizontal ABduction  PROM;5 reps;AAROM;10 reps    Horizontal ABduction Limitations  adduction only     External Rotation  PROM;5 reps;AAROM;10 reps    Internal Rotation  PROM;5 reps;AAROM;10 reps    Flexion  PROM;5 reps;AAROM;10 reps    ABduction  Limitations    ABduction Limitations  no abduction x 12 weeks per protocol      Shoulder Exercises: Standing   Protraction  AAROM;10 reps    Horizontal ABduction  AAROM;10 reps;Limitations    Horizontal ABduction Limitations  adduction only    External Rotation  AAROM;10 reps    Internal Rotation  AAROM;10 reps    Flexion  AAROM;10 reps    ABduction  Limitations    ABduction Limitations  no abduction x12 weeks    Extension  AROM;15 reps    Row  AROM;15 reps      Shoulder Exercises: Pulleys   Flexion  1 minute      Shoulder Exercises: Therapy Ball   Flexion  20 reps      Shoulder Exercises: ROM/Strengthening   Wall Wash  1'    Thumb Tacks  1'     Caudal Glide  3X20"    Prot/Ret//Elev/Dep  1'      Manual Therapy   Manual Therapy  Myofascial release    Manual therapy comments  completed separately from therapeutic exercises    Myofascial Release  myofascial release and manual techniques to left upper arm, trapezius, and scapularis regions to decrease pain and fascial restrictions and increase joint range of motion             OT Education - 10/18/18 1152    Education Details  provided handout of 10/11/2018 measurements for MD appt    Person(s) Educated  Patient    Methods  Handout    Comprehension  Verbalized understanding       OT Short Term Goals - 09/16/18 1027      OT SHORT TERM GOAL #1   Title  Pt will be provided with and educated on HEP to improve functional use of LUE during ADL completion.     Time  4    Period  Weeks    Status  On-going      OT SHORT TERM GOAL #2   Title  Pt will increase LUE P/ROM to [facility] to improve ability to reach for items at table height.     Time  4    Period  Weeks    Status  On-going      OT SHORT TERM GOAL #3   Title  Pt will decrease pain in LUE to 5/10 to improve ability use sleep for minimum of 4 consecutive hours.     Time  4    Period  Weeks    Status  On-going        OT Long Term Goals - 09/16/18 1027      OT LONG TERM GOAL #1   Title  Pt will return to highest level of functioning using LUE as non-dominant during ADL and work task completion.     Time  8    Period  Weeks    Status  On-going      OT LONG TERM GOAL #2   Title  Pt will decrease LUE fascial restrictions to minimal amounts or less to improve mobility required for functional reaching tasks.     Time  8    Period  Weeks    Status  On-going      OT LONG TERM GOAL #3   Title  Pt will decrease pain in LUE to 3/10 or less to improve ability to use LUE for dressing tasks.     Time  8    Period  Weeks    Status  On-going      OT LONG TERM GOAL #4   Title  Pt will increase LUE A/ROM to [facility] to improve  ability to reach overhead and behind back during ADL completion.     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     Time  8    Period  Weeks    Status  On-going            Plan - 10/18/18 1153    Clinical Impression Statement  A: Pt reports dull ache over the weekend, new exercises are going well. Manual therapy completed to address fascial restrictions in left bicep and anterior deltoid regions. Continued with AA/ROM, pt completed with ROM WNL for flexion and is continuing to improve with er. Added wall wash and pulleys today. Verbal cuing for form and technique.     Plan  P: Follow up on MD appt, add pvc pipe slide, add ball circles to begin working on IR       Patient will benefit from skilled therapeutic intervention in order to improve the following deficits and impairments:  Decreased range of motion, Impaired flexibility, Decreased activity tolerance, Increased fascial restrictions, Impaired UE functional use, Decreased strength  Visit Diagnosis: Acute pain of left shoulder  Other symptoms and signs involving the musculoskeletal system  Stiffness of left shoulder, not elsewhere classified    Problem List Patient Active Problem List   Diagnosis Date Noted  . History of colonic polyps 05/18/2016  . 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 Biceps tendinitis of both shoulders 10/23/2014  . Right anterior knee pain 10/23/2014  . 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 Left shoulder pain 04/12/2013   Guadelupe Sabin, OTR/L  [phone] 10/18/2018, 11:55 AM  [facility] [address] [place], [place], [address] Phone: [phone]   Fax:  [phone]  Name: Brant Peets. MRN: [id number] Date of Birth: 1965/11/25

## 2018-10-20 ENCOUNTER — Encounter (HOSPITAL_COMMUNITY): Payer: Self-pay | Admitting: Occupational Therapy

## 2018-10-20 ENCOUNTER — Ambulatory Visit (HOSPITAL_COMMUNITY): Payer: 59 | Admitting: Occupational Therapy

## 2018-10-20 DIAGNOSIS — M25512 Pain in left shoulder: Secondary | ICD-10-CM | POA: Diagnosis not present

## 2018-10-20 DIAGNOSIS — R29898 Other symptoms and signs involving the musculoskeletal system: Secondary | ICD-10-CM

## 2018-10-20 DIAGNOSIS — M25612 Stiffness of left shoulder, not elsewhere classified: Secondary | ICD-10-CM

## 2018-10-20 NOTE — Therapy (Signed)
[place] [place], [place], [address] Phone: [phone]   Fax:  [phone]  Occupational Therapy Treatment  Patient Details  Name: Douglas Robertson. MRN: [id number] Date of Birth: 11/12/1965 Referring Provider (OT): Dr. Netta Cedars   Encounter Date: 10/20/2018  OT End of Session - 10/20/18 0944    Visit Number  11    Number of Visits  16    Date for OT Re-Evaluation  11/13/18    Authorization Type  UHC     Authorization Time Period  60 visit limit, 0 used as of 09/14/2018; $17 copay    Authorization - Visit Number  11    Authorization - Number of Visits  60    OT Start Time  0900    OT Stop Time  0943    OT Time Calculation (min)  43 min    Activity Tolerance  Patient tolerated treatment well    Behavior During Therapy  [facility] for tasks assessed/performed       Past Medical History:  Diagnosis Date  . Cancer ([place])    kidney (L)  . Chronic kidney disease 2017   left renal mass  . Diabetes mellitus without complication ([place])   . Hypertension     Past Surgical History:  Procedure Laterality Date  . COLONOSCOPY  2012  . COLONOSCOPY N/A 09/03/2016   Procedure: COLONOSCOPY;  Surgeon: Rogene Houston, MD;  [facility];  Service: Endoscopy;  Laterality: N/A;  730  . KNEE ARTHROSCOPY  1998    right   . LAPAROSCOPIC NEPHRECTOMY Left 10/10/2015   Procedure: LAPAROSCOPIC RADICAL NEPHRECTOMY;  Surgeon: Raynelle Bring, MD;  [facility];  Service: Urology;  Laterality: Left;    There were no vitals filed for this visit.  Subjective Assessment - 10/20/18 0858    Subjective   S: The doctor is pleased with my progress.     Currently in Pain?  Yes    Pain Score  2     Pain Location  Shoulder    Pain Orientation  Left    Pain Descriptors / Indicators  Sore    Pain Type  Acute pain    Pain Radiating Towards  shoulder to elbow    Pain Onset  1 to 4 weeks ago    Pain Frequency  Intermittent    Aggravating  Factors   movement    Pain Relieving Factors  ice, rest    Effect of Pain on Daily Activities  max effect on use of LUE during ADLs    Multiple Pain Sites  No         [facility] OT Assessment - 10/20/18 0857      Assessment   Medical Diagnosis  s/p left RC repair      Precautions   Precautions  Shoulder    Type of Shoulder Precautions  See protocol. Update 10/20/2018: progress AA/ROM to full, PRE's with theraband, ok to begin IR behind back    Shoulder Interventions  Shoulder sling/immobilizer;For comfort               OT Treatments/Exercises (OP) - 10/20/18 0904      Exercises   Exercises  Shoulder      Shoulder Exercises: Supine   Protraction  PROM;5 reps;AAROM;15 reps    Horizontal ABduction  PROM;5 reps;AAROM;15 reps    External Rotation  PROM;5 reps;AAROM;15 reps    Internal Rotation  PROM;5 reps;AAROM;15 reps  Flexion  PROM;5 reps;AAROM;15 reps    ABduction  PROM;5 reps;AAROM;15 reps      Shoulder Exercises: Standing   Protraction  AAROM;12 reps    Horizontal ABduction  AAROM;12 reps    External Rotation  AAROM;12 reps    Internal Rotation  AAROM;12 reps    Flexion  AAROM;12 reps    ABduction  AAROM;12 reps    Extension  Theraband;10 reps    Theraband Level (Shoulder Extension)  Level 2 (Red)    Row  Theraband;10 reps    Theraband Level (Shoulder Row)  Level 2 (Red)    Retraction  Theraband;10 reps    Theraband Level (Shoulder Retraction)  Level 2 (Red)      Shoulder Exercises: Pulleys   Flexion  1 minute    ABduction  1 minute      Shoulder Exercises: ROM/Strengthening   Wall Wash  1'      Manual Therapy   Manual Therapy  Myofascial release    Manual therapy comments  completed separately from therapeutic exercises    Myofascial Release  myofascial release and manual techniques to left upper arm, trapezius, and scapularis regions to decrease pain and fascial restrictions and increase joint range of motion             OT Education -  10/20/18 0943    Education Details  AA/ROM including abduction    Person(s) Educated  Patient    Methods  Explanation;Demonstration;Handout    Comprehension  Verbalized understanding;Returned demonstration       OT Short Term Goals - 09/16/18 1027      OT SHORT TERM GOAL #1   Title  Pt will be provided with and educated on HEP to improve functional use of LUE during ADL completion.     Time  4    Period  Weeks    Status  On-going      OT SHORT TERM GOAL #2   Title  Pt will increase LUE P/ROM to [facility] to improve ability to reach for items at table height.     Time  4    Period  Weeks    Status  On-going      OT SHORT TERM GOAL #3   Title  Pt will decrease pain in LUE to 5/10 to improve ability use sleep for minimum of 4 consecutive hours.     Time  4    Period  Weeks    Status  On-going        OT Long Term Goals - 09/16/18 1027      OT LONG TERM GOAL #1   Title  Pt will return to highest level of functioning using LUE as non-dominant during ADL and work task completion.     Time  8    Period  Weeks    Status  On-going      OT LONG TERM GOAL #2   Title  Pt will decrease LUE fascial restrictions to minimal amounts or less to improve mobility required for functional reaching tasks.     Time  8    Period  Weeks    Status  On-going      OT LONG TERM GOAL #3   Title  Pt will decrease pain in LUE to 3/10 or less to improve ability to use LUE for dressing tasks.     Time  8    Period  Weeks    Status  On-going      OT  LONG TERM GOAL #4   Title  Pt will increase LUE A/ROM to [facility] to improve ability to reach overhead and behind back during ADL completion.     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     Time  8    Period  Weeks    Status  On-going            Plan - 10/20/18 8295    Clinical  Impression Statement  A: Pt reports MD is pleased with his progress, MD sends update for protocol stating ok for theraband and IR behind back. Continued with manual therapy to address muscle knot palpated in bicep and anterior deltoid regions. Pt with P/ROM WNL with exception of er which is limited to approximately 50% range. Increased supine repetitions to 15, adding abduction and performing full horizontal abduction. Added red theraband scapular strengthening. Verbal cuing for form and technique.     Plan  P: add proximal shoulder strengthening in supine, add therapy ball circles beginning to work on IR       Patient will benefit from skilled therapeutic intervention in order to improve the following deficits and impairments:  Decreased range of motion, Impaired flexibility, Decreased activity tolerance, Increased fascial restrictions, Impaired UE functional use, Decreased strength  Visit Diagnosis: Acute pain of left shoulder  Other symptoms and signs involving the musculoskeletal system  Stiffness of left shoulder, not elsewhere classified    Problem List Patient Active Problem List   Diagnosis Date Noted  . History of colonic polyps 05/18/2016  . 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 Biceps tendinitis of both shoulders 10/23/2014  . Right anterior knee pain 10/23/2014  . 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 Left shoulder pain 04/12/2013   Guadelupe Sabin, OTR/L  [phone] 10/20/2018, 9:45 AM  [place], [place], [address] Phone: [phone]   Fax:  [phone]  Name: Douglas Robertson. MRN: [id number] Date of Birth: 16-Feb-1966

## 2018-10-20 NOTE — Patient Instructions (Signed)

## 2018-10-25 ENCOUNTER — Encounter (HOSPITAL_COMMUNITY): Payer: Self-pay | Admitting: Occupational Therapy

## 2018-10-25 ENCOUNTER — Ambulatory Visit (HOSPITAL_COMMUNITY): Payer: 59 | Admitting: Occupational Therapy

## 2018-10-25 DIAGNOSIS — M25512 Pain in left shoulder: Secondary | ICD-10-CM | POA: Diagnosis not present

## 2018-10-25 DIAGNOSIS — R29898 Other symptoms and signs involving the musculoskeletal system: Secondary | ICD-10-CM

## 2018-10-25 DIAGNOSIS — M25612 Stiffness of left shoulder, not elsewhere classified: Secondary | ICD-10-CM

## 2018-10-25 NOTE — Therapy (Signed)
[place], [place], [address] Phone: [phone]   Fax:  [phone]  Occupational Therapy Treatment  Patient Details  Name: Douglas Robertson. MRN: [id number] Date of Birth: 07-13-1966 Referring Provider (OT): Dr. Netta Cedars   Encounter Date: 10/25/2018  OT End of Session - 10/25/18 1202    Visit Number  12    Number of Visits  16    Date for OT Re-Evaluation  11/13/18    Authorization Type  [employer]     Authorization Time Period  60 visit limit, 0 used as of 09/14/2018; $17 copay    Authorization - Visit Number  12    Authorization - Number of Visits  74    OT Start Time  4259    OT Stop Time  1159    OT Time Calculation (min)  42 min    Activity Tolerance  Patient tolerated treatment well    Behavior During Therapy  [facility] for tasks assessed/performed       Past Medical History:  Diagnosis Date  . Cancer ([place])    kidney (L)  . Chronic kidney disease 2017   left renal mass  . Diabetes mellitus without complication ([place])   . Hypertension     Past Surgical History:  Procedure Laterality Date  . COLONOSCOPY  2012  . COLONOSCOPY N/A 09/03/2016   Procedure: COLONOSCOPY;  Surgeon: Rogene Houston, MD;  [facility];  Service: Endoscopy;  Laterality: N/A;  730  . KNEE ARTHROSCOPY  1998    right   . LAPAROSCOPIC NEPHRECTOMY Left 10/10/2015   Procedure: LAPAROSCOPIC RADICAL NEPHRECTOMY;  Surgeon: Raynelle Bring, MD;  Location: [facility];  Service: Urology;  Laterality: Left;    There were no vitals filed for this visit.  Subjective Assessment - 10/25/18 1115    Subjective   S: It really bothered me after the last session so I put some ice on it.     Currently in Pain?  Yes    Pain Score  4     Pain Location  Shoulder    Pain Orientation  Left    Pain Descriptors / Indicators  Sore    Pain Type  Acute pain    Pain Radiating Towards  shoulder to elbow     Pain Onset  1 to 4 weeks ago    Pain Frequency   Intermittent    Aggravating Factors   movement    Pain Relieving Factors  ice, rest    Effect of Pain on Daily Activities  mod/max effect on ADLs    Multiple Pain Sites  No         [facility] OT Assessment - 10/25/18 1115      Assessment   Medical Diagnosis  s/p left RC repair      Precautions   Precautions  Shoulder    Type of Shoulder Precautions  See protocol. Update 10/20/2018: progress AA/ROM to full, PRE's with theraband, ok to begin IR behind back    Shoulder Interventions  Shoulder sling/immobilizer;For comfort               OT Treatments/Exercises (OP) - 10/25/18 1116      Exercises   Exercises  Shoulder      Shoulder Exercises: Supine   Protraction  PROM;5 reps;AAROM;15 reps    Horizontal ABduction  PROM;5 reps;AAROM;15 reps    External Rotation  PROM;5 reps;AAROM;15 reps    Internal Rotation  PROM;5  reps;AAROM;15 reps    Flexion  PROM;5 reps;AAROM;15 reps    ABduction  PROM;5 reps;AAROM;15 reps    Other Supine Exercises  serratus anterior punches, 15X      Shoulder Exercises: Standing   Protraction  AAROM;12 reps    Horizontal ABduction  AAROM;12 reps    External Rotation  AAROM;12 reps    Internal Rotation  AAROM;12 reps    Flexion  AAROM;12 reps    ABduction  AAROM;12 reps    Extension  Theraband;10 reps    Theraband Level (Shoulder Extension)  Level 2 (Red)    Row  Theraband;10 reps    Theraband Level (Shoulder Row)  Level 2 (Red)      Shoulder Exercises: ROM/Strengthening   Wall Wash  1'    Proximal Shoulder Strengthening, Supine  10X each, no rest breaks    Other ROM/Strengthening Exercises  ball pass behind back working on IR: 10X       Manual Therapy   Manual Therapy  Myofascial release    Manual therapy comments  completed separately from therapeutic exercises    Myofascial Release  myofascial release and manual techniques to left upper arm, trapezius, and scapularis regions to decrease pain and fascial restrictions and increase joint range  of motion               OT Short Term Goals - 09/16/18 1027      OT SHORT TERM GOAL #1   Title  Pt will be provided with and educated on HEP to improve functional use of LUE during ADL completion.     Time  4    Period  Weeks    Status  On-going      OT SHORT TERM GOAL #2   Title  Pt will increase LUE P/ROM to [facility] to improve ability to reach for items at table height.     Time  4    Period  Weeks    Status  On-going      OT SHORT TERM GOAL #3   Title  Pt will decrease pain in LUE to 5/10 to improve ability use sleep for minimum of 4 consecutive hours.     Time  4    Period  Weeks    Status  On-going        OT Long Term Goals - 09/16/18 1027      OT LONG TERM GOAL #1   Title  Pt will return to highest level of functioning using LUE as non-dominant during ADL and work task completion.     Time  8    Period  Weeks    Status  On-going      OT LONG TERM GOAL #2   Title  Pt will decrease LUE fascial restrictions to minimal amounts or less to improve mobility required for functional reaching tasks.     Time  8    Period  Weeks    Status  On-going      OT LONG TERM GOAL #3   Title  Pt will decrease pain in LUE to 3/10 or less to improve ability to use LUE for dressing tasks.     Time  8    Period  Weeks    Status  On-going      OT LONG TERM GOAL #4   Title  Pt will increase LUE A/ROM to [facility] to improve ability to reach overhead and behind back during ADL completion.     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     Time  8    Period  Weeks    Status  On-going            Plan - 10/25/18 1203    Clinical Impression Statement  A: Moderate muscle knot palpated in anterior deltoid, manual therapy completed to address. Continued with AA/ROM this session, adding PVC pipe slide. Added  serratus anterior punches,  proximal shoulder strengthening in supine, and ball pass behind back today. Verbal cuing for form and technique. Pt with ROM WNL with exception of er, which is slowly improving.     Plan  P: Continue working to improve er within pain tolerance. Resume retraction with red theraband and add proximal shoulder strengthening on door       Patient will benefit from skilled therapeutic intervention in order to improve the following deficits and impairments:  Decreased range of motion, Impaired flexibility, Decreased activity tolerance, Increased fascial restrictions, Impaired UE functional use, Decreased strength  Visit Diagnosis: Acute pain of left shoulder  Other symptoms and signs involving the musculoskeletal system  Stiffness of left shoulder, not elsewhere classified    Problem List Patient Active Problem List   Diagnosis Date Noted  . History of colonic polyps 05/18/2016  . 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 Biceps tendinitis of both shoulders 10/23/2014  . Right anterior knee pain 10/23/2014  . 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 Left shoulder pain 04/12/2013   Guadelupe Sabin, OTR/L  [phone] 10/25/2018, 12:05 PM  [place] [address] [place], [place], [address] Phone: [phone]   Fax:  [phone]  Name: Vasilis Luhman. MRN: [id number] Date of Birth: [birth year]

## 2018-10-27 ENCOUNTER — Encounter (HOSPITAL_COMMUNITY): Payer: Self-pay | Admitting: Occupational Therapy

## 2018-10-27 ENCOUNTER — Ambulatory Visit (HOSPITAL_COMMUNITY): Payer: 59 | Admitting: Occupational Therapy

## 2018-10-27 DIAGNOSIS — M25612 Stiffness of left shoulder, not elsewhere classified: Secondary | ICD-10-CM

## 2018-10-27 DIAGNOSIS — M25512 Pain in left shoulder: Secondary | ICD-10-CM | POA: Diagnosis not present

## 2018-10-27 DIAGNOSIS — R29898 Other symptoms and signs involving the musculoskeletal system: Secondary | ICD-10-CM

## 2018-10-27 NOTE — Patient Instructions (Signed)
(  Home) Extension: Isometric / Bilateral Arm Retraction - Sitting   Facing anchor, hold hands and elbow at shoulder height, with elbow bent.  Pull arms back to squeeze shoulder blades together. Repeat 10-15 times. 1-3 times/day.   (Clinic) Extension / Flexion (Assist)   Face anchor, pull arms back, keeping elbow straight, and squeze shoulder blades together. Repeat 10-15 times. 1-3 times/day.   Copyright  VHI. All rights reserved.   (Home) Retraction: Row - Bilateral (Anchor)   Facing anchor, arms reaching forward, pull hands toward stomach, keeping elbows bent and at your sides and pinching shoulder blades together. Repeat 10-15 times. 1-3 times/day.   Copyright  VHI. All rights reserved.       External Rotation Stretch:     Place your affected hand on the wall with the elbow bent and gently turn your body the opposite direction until a stretch is felt. Hold 10 seconds, repeat 3-5X. Complete 1-2 times/day.

## 2018-10-27 NOTE — Therapy (Addendum)
[place], [place], [address] Phone: [phone]   Fax:  [phone]  Occupational Therapy Treatment  Patient Details  Name: Douglas Robertson. MRN: [id number] Date of Birth: May 21, 1966 Referring Provider (OT): Dr. Netta Cedars   Encounter Date: 10/27/2018  OT End of Session - 10/27/18 1024    Visit Number  13    Number of Visits  16    Date for OT Re-Evaluation  11/13/18    Authorization Type  UHC     Authorization Time Period  60 visit limit, 0 used as of 09/14/2018; $17 copay    Authorization - Visit Number  13    Authorization - Number of Visits  60    OT Start Time  0900    OT Stop Time  0945    OT Time Calculation (min)  45 min    Activity Tolerance  Patient tolerated treatment well    Behavior During Therapy  [facility] for tasks assessed/performed       Past Medical History:  Diagnosis Date  . Cancer ([place])    kidney (L)  . Chronic kidney disease 2017   left renal mass  . Diabetes mellitus without complication ([place])   . Hypertension     Past Surgical History:  Procedure Laterality Date  . COLONOSCOPY  2012  . COLONOSCOPY N/A 09/03/2016   Procedure: COLONOSCOPY;  Surgeon: Rogene Houston, MD;  [facility];  Service: Endoscopy;  Laterality: N/A;  730  . KNEE ARTHROSCOPY  1998    right   . LAPAROSCOPIC NEPHRECTOMY Left 10/10/2015   Procedure: LAPAROSCOPIC RADICAL NEPHRECTOMY;  Surgeon: Raynelle Bring, MD;  [facility];  Service: Urology;  Laterality: Left;    There were no vitals filed for this visit.  Subjective Assessment - 10/27/18 0855    Subjective   S: It's not really hurting, it's more discomfort.     Currently in Pain?  Yes    Pain Score  2     Pain Location  Shoulder    Pain Orientation  Left    Pain Descriptors / Indicators  Sore    Pain Type  Acute pain    Pain Radiating Towards  shoulder to elbow     Pain Onset  1 to 4 weeks ago    Pain Frequency  Intermittent    Aggravating Factors   movement    Pain Relieving Factors  ice, rest    Effect of Pain on Daily Activities  mod effect on ADLs    Multiple Pain Sites  No         [facility] OT Assessment - 10/27/18 0855      Assessment   Medical Diagnosis  s/p left RC repair      Precautions   Precautions  Shoulder    Type of Shoulder Precautions  See protocol. Update 10/20/2018: progress AA/ROM to full, PRE's with theraband, ok to begin IR behind back    Shoulder Interventions  Shoulder sling/immobilizer;For comfort               OT Treatments/Exercises (OP) - 10/27/18 4656      Exercises   Exercises  Shoulder      Shoulder Exercises: Supine   Protraction  PROM;5 reps;AAROM;15 reps    Horizontal ABduction  PROM;5 reps;AAROM;15 reps    External Rotation  PROM;5 reps;AAROM;15 reps    Internal Rotation  PROM;5 reps;AAROM;15 reps    Flexion  PROM;5 reps;AAROM;15  reps    ABduction  PROM;5 reps;AAROM;15 reps    Other Supine Exercises  serratus anterior punches, 15X      Shoulder Exercises: Standing   Protraction  AAROM;15 reps    Horizontal ABduction  AAROM;15 reps    External Rotation  AAROM;15 reps    Internal Rotation  AAROM;15 reps    Flexion  AAROM;15 reps    ABduction  AAROM;15 reps    Extension  Theraband;10 reps    Theraband Level (Shoulder Extension)  Level 2 (Red)    Row  Theraband;10 reps    Theraband Level (Shoulder Row)  Level 2 (Red)    Retraction  Theraband;10 reps    Theraband Level (Shoulder Retraction)  Level 2 (Red)      Shoulder Exercises: ROM/Strengthening   Wall Wash  1'    Proximal Shoulder Strengthening, Supine  15X each, no rest breaks    Proximal Shoulder Strengthening, Seated  10X each no rest breaks    Other ROM/Strengthening Exercises  proximal strengthening on doorway, 1' flexion      Shoulder Exercises: Stretch   External Rotation Stretch  3 reps;10 seconds      Manual Therapy   Manual Therapy  Myofascial release    Manual therapy comments   completed separately from therapeutic exercises    Myofascial Release  myofascial release and manual techniques to left upper arm, trapezius, and scapularis regions to decrease pain and fascial restrictions and increase joint range of motion             OT Education - 10/27/18 0930    Education Details  red scapular theraband; er stretch at [employer]) Educated  Patient    Methods  Explanation;Demonstration;Handout    Comprehension  Verbalized understanding;Returned demonstration       OT Short Term Goals - 09/16/18 1027      OT SHORT TERM GOAL #1   Title  Pt will be provided with and educated on HEP to improve functional use of LUE during ADL completion.     Time  4    Period  Weeks    Status  On-going      OT SHORT TERM GOAL #2   Title  Pt will increase LUE P/ROM to [facility] to improve ability to reach for items at table height.     Time  4    Period  Weeks    Status  On-going      OT SHORT TERM GOAL #3   Title  Pt will decrease pain in LUE to 5/10 to improve ability use sleep for minimum of 4 consecutive hours.     Time  4    Period  Weeks    Status  On-going        OT Long Term Goals - 09/16/18 1027      OT LONG TERM GOAL #1   Title  Pt will return to highest level of functioning using LUE as non-dominant during ADL and work task completion.     Time  8    Period  Weeks    Status  On-going      OT LONG TERM GOAL #2   Title  Pt will decrease LUE fascial restrictions to minimal amounts or less to improve mobility required for functional reaching tasks.     Time  8    Period  Weeks    Status  On-going      OT LONG TERM GOAL #3  Title  Pt will decrease pain in LUE to 3/10 or less to improve ability to use LUE for dressing tasks.     Time  8    Period  Weeks    Status  On-going      OT LONG TERM GOAL #4   Title  Pt will increase LUE A/ROM to [facility] to improve ability to reach overhead and behind back during ADL completion.     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     Time  8    Period  Weeks    Status  On-going            Plan - 10/27/18 0919    Clinical Impression Statement  A: One muscle knot palpated at anterior deltoid this session, manual therapy completed to address. Continued with AA/ROM today, er continues to be most difficult. Added proximal shoulder strengthening in standing and on doorway. Also resumed retraction with band and updated for HEP. Also added er stretch at doorway. Verbal cuing for form and technique. Pt is concerned about lifting requirements at work and is scheduled to return on 12/05/2018. OT to call MD about progressing to A/ROM and discuss pt concerns.     Plan  P: OT called and spoke with PA-Bradley Dixon at [facility]-instructions for AA/ROM and therabands, PREs are ok, do not progress to A/ROM until 9 weeks out. Continue with AA/ROM, follow up on HEP       Patient will benefit from skilled therapeutic intervention in order to improve the following deficits and impairments:  Decreased range of motion, Impaired flexibility, Decreased activity tolerance, Increased fascial restrictions, Impaired UE functional use, Decreased strength  Visit Diagnosis: Acute pain of left shoulder  Other symptoms and signs involving the musculoskeletal system  Stiffness of left shoulder, not elsewhere classified    Problem List Patient Active Problem List   Diagnosis Date Noted  . History of colonic polyps 05/18/2016  . 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 Biceps tendinitis of both shoulders 10/23/2014  . Right anterior knee pain 10/23/2014  . 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 Left shoulder pain 04/12/2013   Guadelupe Sabin, OTR/L  [phone] 10/27/2018, 11:11 AM  [place], [place], [address] Phone: [phone]   Fax:  [phone]  Name: Douglas Robertson. MRN: [id number] Date of Birth: 03-27-1966

## 2018-10-28 DIAGNOSIS — M25561 Pain in right knee: Secondary | ICD-10-CM | POA: Diagnosis not present

## 2018-11-01 ENCOUNTER — Encounter (HOSPITAL_COMMUNITY): Payer: Self-pay | Admitting: Occupational Therapy

## 2018-11-01 ENCOUNTER — Ambulatory Visit (HOSPITAL_COMMUNITY): Payer: 59 | Admitting: Occupational Therapy

## 2018-11-01 DIAGNOSIS — M25512 Pain in left shoulder: Secondary | ICD-10-CM | POA: Diagnosis not present

## 2018-11-01 DIAGNOSIS — R29898 Other symptoms and signs involving the musculoskeletal system: Secondary | ICD-10-CM

## 2018-11-01 DIAGNOSIS — M25612 Stiffness of left shoulder, not elsewhere classified: Secondary | ICD-10-CM

## 2018-11-01 NOTE — Therapy (Signed)
[place], [place], [address] Phone: [phone]   Fax:  [phone]  Occupational Therapy Treatment  Patient Details  Name: Douglas Robertson. MRN: [id number] Date of Birth: 23-Mar-1966 Referring Provider (OT): Dr. Netta Cedars   Encounter Date: 11/01/2018  OT End of Session - 11/01/18 1115    Visit Number  14    Number of Visits  16    Date for OT Re-Evaluation  11/13/18    Authorization Type  UHC     Authorization Time Period  60 visit limit, 0 used as of 09/14/2018; $17 copay    Authorization - Visit Number  14    Authorization - Number of Visits  60    OT Start Time  5027    OT Stop Time  1113    OT Time Calculation (min)  41 min    Activity Tolerance  Patient tolerated treatment well    Behavior During Therapy  [facility] for tasks assessed/performed       Past Medical History:  Diagnosis Date  . Cancer ([place])    kidney (L)  . Chronic kidney disease 2017   left renal mass  . Diabetes mellitus without complication ([place])   . Hypertension     Past Surgical History:  Procedure Laterality Date  . COLONOSCOPY  2012  . COLONOSCOPY N/A 09/03/2016   Procedure: COLONOSCOPY;  Surgeon: Rogene Houston, MD;  [facility];  Service: Endoscopy;  Laterality: N/A;  730  . KNEE ARTHROSCOPY  1998    right   . LAPAROSCOPIC NEPHRECTOMY Left 10/10/2015   Procedure: LAPAROSCOPIC RADICAL NEPHRECTOMY;  Surgeon: Raynelle Bring, MD;  [facility];  Service: Urology;  Laterality: Left;    There were no vitals filed for this visit.  Subjective Assessment - 11/01/18 1031    Subjective   S: I couldn't sleep last night, just couldn't get comfortable.     Currently in Pain?  Yes    Pain Score  3     Pain Location  Shoulder    Pain Orientation  Left    Pain Descriptors / Indicators  Sore    Pain Type  Acute pain    Pain Radiating Towards  shoulder to elbow    Pain Onset  1 to 4 weeks ago    Pain Frequency   Intermittent    Aggravating Factors   movement    Pain Relieving Factors  ice, rest    Effect of Pain on Daily Activities  mod effect on ADLs    Multiple Pain Sites  No         [facility] OT Assessment - 11/01/18 1031      Assessment   Medical Diagnosis  s/p left RC repair      Precautions   Precautions  Shoulder    Type of Shoulder Precautions  See protocol. Update 10/20/2018: progress AA/ROM to full, PRE's with theraband, ok to begin IR behind back    Shoulder Interventions  Shoulder sling/immobilizer;For comfort               OT Treatments/Exercises (OP) - 11/01/18 1031      Exercises   Exercises  Shoulder      Shoulder Exercises: Supine   Protraction  PROM;5 reps;AAROM;15 reps    Horizontal ABduction  PROM;5 reps;AAROM;15 reps    External Rotation  PROM;5 reps;AAROM;15 reps    Internal Rotation  PROM;5 reps;AAROM;15 reps    Flexion  PROM;5 reps;AAROM;15 reps    ABduction  PROM;5 reps;AAROM;15 reps    Other Supine Exercises  serratus anterior punches, 15X      Shoulder Exercises: Standing   Protraction  AAROM;15 reps    Horizontal ABduction  AAROM;15 reps    External Rotation  AAROM;15 reps    Internal Rotation  AAROM;15 reps    Flexion  AAROM;15 reps    ABduction  AAROM;15 reps    Extension  Theraband;12 reps    Theraband Level (Shoulder Extension)  Level 2 (Red)    Row  Theraband;12 reps    Theraband Level (Shoulder Row)  Level 2 (Red)    Retraction  Theraband;12 reps    Theraband Level (Shoulder Retraction)  Level 2 (Red)      Shoulder Exercises: ROM/Strengthening   Wall Wash  2'    Proximal Shoulder Strengthening, Seated  10X each no rest breaks    Other ROM/Strengthening Exercises  ball pass behind back working on IR: 10X     Other ROM/Strengthening Exercises  proximal strengthening on doorway, 1' flexion      Shoulder Exercises: Stretch   External Rotation Stretch  3 reps;10 seconds      Manual Therapy   Manual Therapy  Myofascial release     Manual therapy comments  completed separately from therapeutic exercises    Myofascial Release  myofascial release and manual techniques to left upper arm, trapezius, and scapularis regions to decrease pain and fascial restrictions and increase joint range of motion               OT Short Term Goals - 09/16/18 1027      OT SHORT TERM GOAL #1   Title  Pt will be provided with and educated on HEP to improve functional use of LUE during ADL completion.     Time  4    Period  Weeks    Status  On-going      OT SHORT TERM GOAL #2   Title  Pt will increase LUE P/ROM to [facility] to improve ability to reach for items at table height.     Time  4    Period  Weeks    Status  On-going      OT SHORT TERM GOAL #3   Title  Pt will decrease pain in LUE to 5/10 to improve ability use sleep for minimum of 4 consecutive hours.     Time  4    Period  Weeks    Status  On-going        OT Long Term Goals - 09/16/18 1027      OT LONG TERM GOAL #1   Title  Pt will return to highest level of functioning using LUE as non-dominant during ADL and work task completion.     Time  8    Period  Weeks    Status  On-going      OT LONG TERM GOAL #2   Title  Pt will decrease LUE fascial restrictions to minimal amounts or less to improve mobility required for functional reaching tasks.     Time  8    Period  Weeks    Status  On-going      OT LONG TERM GOAL #3   Title  Pt will decrease pain in LUE to 3/10 or less to improve ability to use LUE for dressing tasks.     Time  8    Period  Weeks    Status  On-going      OT LONG TERM GOAL #4   Title  Pt will increase LUE A/ROM to [facility] to improve ability to reach overhead and behind back during ADL completion.     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     Time  8    Period  Weeks     Status  On-going            Plan - 11/01/18 1115    Clinical Impression Statement  A: Pt reports HEP is going well, he is now sleeping in his bed, had difficulty getting comfortable last night. Manual therapy completed to address fascial restrictions in upper arm and anterior deltoid. Continued with AA/ROM, increasing wall wash to 2' to work on activity tolerance. Increased all AA/ROM repetitions to 15, theraband to 12. Verbal cuing for form and technique. Discussed phone call with PA, informed pt of inability to progress until protocol allows and encouraged him to discuss work concerns with MD at next appt.     Plan  P: Continue with AA/ROM and begin to complete gentle towel stretch for IR behind back.        Patient will benefit from skilled therapeutic intervention in order to improve the following deficits and impairments:  Decreased range of motion, Impaired flexibility, Decreased activity tolerance, Increased fascial restrictions, Impaired UE functional use, Decreased strength  Visit Diagnosis: Acute pain of left shoulder  Other symptoms and signs involving the musculoskeletal system  Stiffness of left shoulder, not elsewhere classified    Problem List Patient Active Problem List   Diagnosis Date Noted  . History of colonic polyps 05/18/2016  . 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 Biceps tendinitis of both shoulders 10/23/2014  . Right anterior knee pain 10/23/2014  . 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 Left shoulder pain 04/12/2013   Guadelupe Sabin, OTR/L  [phone] 11/01/2018, 11:19 AM  [place], [place], [address] Phone: [phone]   Fax:  [phone]  Name: Douglas Robertson. MRN: [id number] Date of Birth: 1966/01/04

## 2018-11-03 ENCOUNTER — Ambulatory Visit (HOSPITAL_COMMUNITY): Payer: 59 | Admitting: Occupational Therapy

## 2018-11-03 ENCOUNTER — Encounter (HOSPITAL_COMMUNITY): Payer: Self-pay | Admitting: Occupational Therapy

## 2018-11-03 DIAGNOSIS — R29898 Other symptoms and signs involving the musculoskeletal system: Secondary | ICD-10-CM

## 2018-11-03 DIAGNOSIS — M25612 Stiffness of left shoulder, not elsewhere classified: Secondary | ICD-10-CM

## 2018-11-03 DIAGNOSIS — M25512 Pain in left shoulder: Secondary | ICD-10-CM | POA: Diagnosis not present

## 2018-11-03 NOTE — Therapy (Signed)
[place], [place], [address] Phone: [phone]   Fax:  [phone]  Occupational Therapy Treatment  Patient Details  Name: Douglas Robertson. MRN: [id number] Date of Birth: April 16, 1966 Referring Provider (OT): Dr. Netta Cedars   Encounter Date: 11/03/2018  OT End of Session - 11/03/18 0938    Visit Number  15    Number of Visits  16    Date for OT Re-Evaluation  11/13/18    Authorization Type  [employer]     Authorization Time Period  60 visit limit, 0 used as of 09/14/2018; $17 copay    Authorization - Visit Number  15    Authorization - Number of Visits  60    OT Start Time  0900    OT Stop Time  0942    OT Time Calculation (min)  42 min    Activity Tolerance  Patient tolerated treatment well    Behavior During Therapy  [facility] for tasks assessed/performed       Past Medical History:  Diagnosis Date  . Cancer ([place])    kidney (L)  . Chronic kidney disease 2017   left renal mass  . Diabetes mellitus without complication ([place])   . Hypertension     Past Surgical History:  Procedure Laterality Date  . COLONOSCOPY  2012  . COLONOSCOPY N/A 09/03/2016   Procedure: COLONOSCOPY;  Surgeon: Rogene Houston, MD;  [facility];  Service: Endoscopy;  Laterality: N/A;  730  . KNEE ARTHROSCOPY  1998    right   . LAPAROSCOPIC NEPHRECTOMY Left 10/10/2015   Procedure: LAPAROSCOPIC RADICAL NEPHRECTOMY;  Surgeon: Raynelle Bring, MD;  Location: [facility];  Service: Urology;  Laterality: Left;    There were no vitals filed for this visit.  Subjective Assessment - 11/03/18 0855    Subjective   S: My bicep is sore today.     Currently in Pain?  Yes    Pain Score  3     Pain Location  Shoulder    Pain Orientation  Left    Pain Descriptors / Indicators  Sore    Pain Type  Acute pain    Pain Radiating Towards  shoulder to elbow    Pain Onset  1 to 4 weeks ago    Pain Frequency  Intermittent    Aggravating Factors    movement    Pain Relieving Factors  ice, rest    Effect of Pain on Daily Activities  mod effect on ADLs    Multiple Pain Sites  No         [facility] OT Assessment - 11/03/18 0855      Assessment   Medical Diagnosis  s/p left RC repair      Precautions   Precautions  Shoulder    Type of Shoulder Precautions  See protocol. Update 10/20/2018: progress AA/ROM to full, PRE's with theraband, ok to begin IR behind back    Shoulder Interventions  Shoulder sling/immobilizer;For comfort               OT Treatments/Exercises (OP) - 11/03/18 0855      Exercises   Exercises  Shoulder      Shoulder Exercises: Supine   Protraction  PROM;5 reps;AAROM;20 reps    Horizontal ABduction  PROM;5 reps;AAROM;20 reps    External Rotation  PROM;5 reps;AAROM;20 reps    Internal Rotation  PROM;5 reps;AAROM;20 reps    Flexion  PROM;5 reps;AAROM;20 reps  ABduction  PROM;5 reps;AAROM;20 reps    Other Supine Exercises  serratus anterior punches, 15X      Shoulder Exercises: Standing   Protraction  AAROM;20 reps    Horizontal ABduction  AAROM;20 reps    External Rotation  AAROM;20 reps    Internal Rotation  AAROM;20 reps    Flexion  AAROM;20 reps    ABduction  AAROM;20 reps    Extension  Theraband;12 reps    Theraband Level (Shoulder Extension)  Level 2 (Red)    Row  Theraband;12 reps    Theraband Level (Shoulder Row)  Level 2 (Red)    Retraction  Theraband;12 reps    Theraband Level (Shoulder Retraction)  Level 2 (Red)      Shoulder Exercises: ROM/Strengthening   Wall Wash  2'    Proximal Shoulder Strengthening, Supine  15X each, no rest breaks    Proximal Shoulder Strengthening, Seated  15X each no rest breaks    Other ROM/Strengthening Exercises  ball pass behind back working on IR: 10X     Other ROM/Strengthening Exercises  proximal strengthening on doorway, 1' flexion      Shoulder Exercises: Stretch   Internal Rotation Stretch  3 reps   horizontal towel, 10"   External Rotation  Stretch  3 reps;10 seconds      Manual Therapy   Manual Therapy  Myofascial release    Manual therapy comments  completed separately from therapeutic exercises    Myofascial Release  myofascial release and manual techniques to left upper arm, trapezius, and scapularis regions to decrease pain and fascial restrictions and increase joint range of motion               OT Short Term Goals - 09/16/18 1027      OT SHORT TERM GOAL #1   Title  Pt will be provided with and educated on HEP to improve functional use of LUE during ADL completion.     Time  4    Period  Weeks    Status  On-going      OT SHORT TERM GOAL #2   Title  Pt will increase LUE P/ROM to [facility] to improve ability to reach for items at table height.     Time  4    Period  Weeks    Status  On-going      OT SHORT TERM GOAL #3   Title  Pt will decrease pain in LUE to 5/10 to improve ability use sleep for minimum of 4 consecutive hours.     Time  4    Period  Weeks    Status  On-going        OT Long Term Goals - 09/16/18 1027      OT LONG TERM GOAL #1   Title  Pt will return to highest level of functioning using LUE as non-dominant during ADL and work task completion.     Time  8    Period  Weeks    Status  On-going      OT LONG TERM GOAL #2   Title  Pt will decrease LUE fascial restrictions to minimal amounts or less to improve mobility required for functional reaching tasks.     Time  8    Period  Weeks    Status  On-going      OT LONG TERM GOAL #3   Title  Pt will decrease pain in LUE to 3/10 or less to improve ability to use LUE  for dressing tasks.     Time  8    Period  Weeks    Status  On-going      OT LONG TERM GOAL #4   Title  Pt will increase LUE A/ROM to [facility] to improve ability to reach overhead and behind back during ADL completion.     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     Time  8    Period  Weeks    Status  On-going            Plan - 11/03/18 0935    Clinical Impression Statement  A: Pt reports he has been completing longer wall wash since last visit. Continued with AA/ROM increasing to 20 repetitions and focusing on form during completion. Added IR towel stretch this session. Verbal cuing for form and technique.     Plan  P: Progress to next phase of protocol, begin A/ROM       Patient will benefit from skilled therapeutic intervention in order to improve the following deficits and impairments:  Decreased range of motion, Impaired flexibility, Decreased activity tolerance, Increased fascial restrictions, Impaired UE functional use, Decreased strength  Visit Diagnosis: Acute pain of left shoulder  Other symptoms and signs involving the musculoskeletal system  Stiffness of left shoulder, not elsewhere classified    Problem List Patient Active Problem List   Diagnosis Date Noted  . History of colonic polyps 05/18/2016  . 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 Biceps tendinitis of both shoulders 10/23/2014  . Right anterior knee pain 10/23/2014  . 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 Left shoulder pain 04/12/2013   Guadelupe Sabin, OTR/L  [phone] 11/03/2018, 9:43 AM  [facility] [address] [place], [place], [address] Phone: [phone]   Fax:  [phone]  Name: Douglas Robertson. MRN: [id number] Date of Birth: 02-07-1966

## 2018-11-08 ENCOUNTER — Encounter (HOSPITAL_COMMUNITY): Payer: Self-pay | Admitting: Occupational Therapy

## 2018-11-08 ENCOUNTER — Ambulatory Visit (HOSPITAL_COMMUNITY): Payer: 59 | Attending: Orthopedic Surgery | Admitting: Occupational Therapy

## 2018-11-08 DIAGNOSIS — M25612 Stiffness of left shoulder, not elsewhere classified: Secondary | ICD-10-CM | POA: Diagnosis not present

## 2018-11-08 DIAGNOSIS — M25512 Pain in left shoulder: Secondary | ICD-10-CM | POA: Insufficient documentation

## 2018-11-08 DIAGNOSIS — R29898 Other symptoms and signs involving the musculoskeletal system: Secondary | ICD-10-CM | POA: Diagnosis not present

## 2018-11-08 NOTE — Patient Instructions (Signed)

## 2018-11-08 NOTE — Therapy (Signed)
[place], [place], [address] Phone: [phone]   Fax:  [phone]  Occupational Therapy Treatment  Patient Details  Name: Douglas Robertson. MRN: [id number] Date of Birth: 26-Jan-1966 Referring Provider (OT): Dr. Netta Cedars   Encounter Date: 11/08/2018  OT End of Session - 11/08/18 1502    Visit Number  16    Number of Visits  16    Date for OT Re-Evaluation  11/13/18    Authorization Type  [employer]     Authorization Time Period  60 visit limit, 0 used as of 09/14/2018; $17 copay    Authorization - Visit Number  16    Authorization - Number of Visits  60    OT Start Time  9030    OT Stop Time  1502    OT Time Calculation (min)  42 min    Activity Tolerance  Patient tolerated treatment well    Behavior During Therapy  WFL for tasks assessed/performed       Past Medical History:  Diagnosis Date  . Cancer ([place])    kidney (L)  . Chronic kidney disease 2017   left renal mass  . Diabetes mellitus without complication ([place])   . Hypertension     Past Surgical History:  Procedure Laterality Date  . COLONOSCOPY  2012  . COLONOSCOPY N/A 09/03/2016   Procedure: COLONOSCOPY;  Surgeon: Rogene Houston, MD;  [facility];  Service: Endoscopy;  Laterality: N/A;  730  . KNEE ARTHROSCOPY  1998    right   . LAPAROSCOPIC NEPHRECTOMY Left 10/10/2015   Procedure: LAPAROSCOPIC RADICAL NEPHRECTOMY;  Surgeon: Raynelle Bring, MD;  Location: [facility];  Service: Urology;  Laterality: Left;    There were no vitals filed for this visit.  Subjective Assessment - 11/08/18 1417    Subjective   S: I'm only wearing the sling when I go out.     Currently in Pain?  Yes    Pain Score  3     Pain Location  Shoulder    Pain Orientation  Left    Pain Descriptors / Indicators  Discomfort;Sore    Pain Type  Acute pain    Pain Radiating Towards  shoulder to elbow    Pain Onset  More than a month ago    Pain Frequency  Intermittent     Aggravating Factors   movement    Pain Relieving Factors  ice, rest    Effect of Pain on Daily Activities  mod effect on ADLs    Multiple Pain Sites  No         [facility] OT Assessment - 11/08/18 1417      Assessment   Medical Diagnosis  s/p left RC repair      Precautions   Precautions  Shoulder    Type of Shoulder Precautions  See protocol. Update 10/20/2018: progress AA/ROM to full, PRE's with theraband, ok to begin IR behind back    Shoulder Interventions  Shoulder sling/immobilizer;For comfort               OT Treatments/Exercises (OP) - 11/08/18 1418      Exercises   Exercises  Shoulder      Shoulder Exercises: Supine   Protraction  PROM;5 reps;AROM;12 reps    Horizontal ABduction  PROM;5 reps;AROM;12 reps    External Rotation  PROM;5 reps;AROM;12 reps    Internal Rotation  PROM;5 reps;AROM;12 reps    Flexion  PROM;5 reps;AROM;12 reps    ABduction  PROM;5 reps;AROM;12 reps      Shoulder Exercises: Standing   Protraction  AROM;12 reps    Horizontal ABduction  AROM;12 reps    External Rotation  AROM;12 reps    Internal Rotation  AROM;12 reps    Flexion  AROM;12 reps    ABduction  AROM;12 reps    Extension  Theraband;15 reps    Theraband Level (Shoulder Extension)  Level 2 (Red)    Row  Theraband;15 reps    Theraband Level (Shoulder Row)  Level 2 (Red)    Retraction  Theraband;15 reps    Theraband Level (Shoulder Retraction)  Level 2 (Red)      Shoulder Exercises: ROM/Strengthening   UBE (Upper Arm Bike)  Level 1 2' forward 2' reverse   Pace: 4.5   Proximal Shoulder Strengthening, Supine  15X each, no rest breaks    Proximal Shoulder Strengthening, Seated  15X each no rest breaks    Other ROM/Strengthening Exercises  ball pass behind back working on IR: 12X     Other ROM/Strengthening Exercises  proximal strengthening on doorway, 1' flexion      Shoulder Exercises: Stretch   Internal Rotation Stretch  3 reps   horizontal towel, 10"    External  Rotation Stretch  3 reps;10 seconds      Manual Therapy   Manual Therapy  Myofascial release    Manual therapy comments  completed separately from therapeutic exercises    Myofascial Release  myofascial release and manual techniques to left upper arm, trapezius, and scapularis regions to decrease pain and fascial restrictions and increase joint range of motion             OT Education - 11/08/18 1444    Education Details  A/ROM exercises    Person(s) Educated  Patient    Methods  Explanation;Demonstration;Handout    Comprehension  Verbalized understanding;Returned demonstration       OT Short Term Goals - 09/16/18 1027      OT SHORT TERM GOAL #1   Title  Pt will be provided with and educated on HEP to improve functional use of LUE during ADL completion.     Time  4    Period  Weeks    Status  On-going      OT SHORT TERM GOAL #2   Title  Pt will increase LUE P/ROM to [facility] to improve ability to reach for items at table height.     Time  4    Period  Weeks    Status  On-going      OT SHORT TERM GOAL #3   Title  Pt will decrease pain in LUE to 5/10 to improve ability use sleep for minimum of 4 consecutive hours.     Time  4    Period  Weeks    Status  On-going        OT Long Term Goals - 09/16/18 1027      OT LONG TERM GOAL #1   Title  Pt will return to highest level of functioning using LUE as non-dominant during ADL and work task completion.     Time  8    Period  Weeks    Status  On-going      OT LONG TERM GOAL #2   Title  Pt will decrease LUE fascial restrictions to minimal amounts or less to improve mobility required for functional reaching tasks.     Time  8    Period  Weeks    Status  On-going      OT LONG TERM GOAL #3   Title  Pt will decrease pain in LUE to 3/10 or less to improve ability to use LUE for dressing tasks.     Time  8    Period  Weeks    Status  On-going      OT LONG TERM GOAL #4   Title  Pt will increase LUE A/ROM to [facility] to improve  ability to reach overhead and behind back during ADL completion.     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     Time  8    Period  Weeks    Status  On-going            Plan - 11/08/18 1503    Clinical Impression Statement  A: Progressed to Phase III of protocol today, initiating A/ROM in supine and standing, and adding UBE. Pt reports fatigue during standing tasks, rest breaks provided as needed. Continued with IR/er stretching and proximal shoulder strengthening this session. Verbal cuing for form and technique.     Pt will benefit from skilled therapeutic intervention in order to improve on the following performance deficits  Body Structure / Function / Physical Skills    Body Structure / Function / Physical Skills  ADL;Strength;Pain;UE functional use;ROM;IADL;Fascial restriction;Flexibility    Plan  P: Reassessment, recertification, FOTO. Continue with A/ROM and follow up on HEP. Add x to v arms        Patient will benefit from skilled therapeutic intervention in order to improve the following deficits and impairments:     Decreased range of motion; Impaired flexibility; Decreased activity tolerance; Increased fascial restrictions; Impaired UE functional use; Decreased strength     Visit Diagnosis: Acute pain of left shoulder  Other symptoms and signs involving the musculoskeletal system  Stiffness of left shoulder, not elsewhere classified    Problem List Patient Active Problem List   Diagnosis Date Noted  . History of colonic polyps 05/18/2016  . 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 Biceps tendinitis of both shoulders 10/23/2014  . Right anterior knee pain 10/23/2014  . 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 Left shoulder pain 04/12/2013   Guadelupe Sabin, OTR/L  [phone] 11/08/2018, 3:05 PM  [facility] [address] [place], [place], [address] Phone: [phone]   Fax:  [phone]  Name: Douglas Robertson. MRN: [id number] Date of Birth: 12/27/1965

## 2018-11-10 ENCOUNTER — Ambulatory Visit (HOSPITAL_COMMUNITY): Payer: 59 | Admitting: Occupational Therapy

## 2018-11-10 ENCOUNTER — Encounter (HOSPITAL_COMMUNITY): Payer: Self-pay | Admitting: Occupational Therapy

## 2018-11-10 DIAGNOSIS — M25512 Pain in left shoulder: Secondary | ICD-10-CM | POA: Diagnosis not present

## 2018-11-10 DIAGNOSIS — R29898 Other symptoms and signs involving the musculoskeletal system: Secondary | ICD-10-CM

## 2018-11-10 DIAGNOSIS — M25612 Stiffness of left shoulder, not elsewhere classified: Secondary | ICD-10-CM

## 2018-11-10 NOTE — Therapy (Signed)
[place], [place], [address] Phone: [phone]   Fax:  [phone]  Occupational Therapy Reassessment, Treatment (recertification)  Patient Details  Name: Douglas Robertson. MRN: [id number] Date of Birth: 1965-09-13 Referring Provider (OT): Dr. Netta Cedars   Encounter Date: 11/10/2018  OT End of Session - 11/10/18 1510    Visit Number  17    Number of Visits  24    Date for OT Re-Evaluation  12/10/18    Authorization Type  [employer]     Authorization Time Period  60 visit limit, 0 used as of 09/14/2018; $17 copay    Authorization - Visit Number  17    Authorization - Number of Visits  60    OT Start Time  1427    OT Stop Time  1509    OT Time Calculation (min)  42 min    Activity Tolerance  Patient tolerated treatment well    Behavior During Therapy  WFL for tasks assessed/performed       Past Medical History:  Diagnosis Date  . Cancer ([place])    kidney (L)  . Chronic kidney disease 2017   left renal mass  . Diabetes mellitus without complication ([place])   . Hypertension     Past Surgical History:  Procedure Laterality Date  . COLONOSCOPY  2012  . COLONOSCOPY N/A 09/03/2016   Procedure: COLONOSCOPY;  Surgeon: Rogene Houston, MD;  [facility];  Service: Endoscopy;  Laterality: N/A;  730  . KNEE ARTHROSCOPY  1998    right   . LAPAROSCOPIC NEPHRECTOMY Left 10/10/2015   Procedure: LAPAROSCOPIC RADICAL NEPHRECTOMY;  Surgeon: Raynelle Bring, MD;  Location: [facility];  Service: Urology;  Laterality: Left;    There were no vitals filed for this visit.  Subjective Assessment - 11/10/18 1428    Subjective   S: The only one that gets me is the 2 minutes wiping the wall.     Currently in Pain?  Yes    Pain Score  3     Pain Location  Shoulder    Pain Orientation  Left    Pain Descriptors / Indicators  Discomfort    Pain Type  Acute pain    Pain Radiating Towards  shoulder to elbow    Pain Onset  More  than a month ago    Pain Frequency  Intermittent    Aggravating Factors   movement    Pain Relieving Factors  ice, rest    Effect of Pain on Daily Activities  mod effect on ADLs    Multiple Pain Sites  No         [facility] OT Assessment - 11/10/18 1428      Assessment   Medical Diagnosis  s/p left RC repair      Precautions   Precautions  Shoulder    Type of Shoulder Precautions  See protocol. Update 10/20/2018: progress AA/ROM to full, PRE's with theraband, ok to begin IR behind back    Shoulder Interventions  Shoulder sling/immobilizer;For comfort      Observation/Other Assessments   Focus on Therapeutic Outcomes (FOTO)   75/100   48/100 previous     Palpation   Palpation comment  Minimal restrictions in bicep and trapezius regions      AROM   Overall AROM Comments  Assessed seated, er/IR adducted    AROM Assessment Site  Shoulder    Right/Left Shoulder  Left  Left Shoulder Flexion  175 Degrees   not previously assessed   Left Shoulder ABduction  180 Degrees   not previously assessed   Left Shoulder Internal Rotation  90 Degrees   not previously assessed   Left Shoulder External Rotation  40 Degrees   not previously assessed-equivalent to RUE     PROM   Overall PROM Comments  Assessed supine, er/IR adducted    PROM Assessment Site  Shoulder    Right/Left Shoulder  Left    Left Shoulder Flexion  180 Degrees   141 previous   Left Shoulder ABduction  180 Degrees   same as previous   Left Shoulder Internal Rotation  90 Degrees   same as previous   Left Shoulder External Rotation  70 Degrees   58 previous     Strength   Overall Strength Comments  Assessed seated, er/IR adducted    Strength Assessment Site  Shoulder    Right/Left Shoulder  Left    Left Shoulder Flexion  4-/5   not previously assessed   Left Shoulder ABduction  3+/5   not previously assessed   Left Shoulder Internal Rotation  5/5   not previously assessed   Left Shoulder External Rotation  4/5    not previously assessed              OT Treatments/Exercises (OP) - 11/10/18 1429      Exercises   Exercises  Shoulder      Shoulder Exercises: Supine   Protraction  PROM;5 reps;AROM;12 reps    Horizontal ABduction  PROM;5 reps;AROM;12 reps    External Rotation  PROM;5 reps;AROM;12 reps    Internal Rotation  PROM;5 reps;AROM;12 reps    Flexion  PROM;5 reps;AROM;12 reps    ABduction  PROM;5 reps;AROM;12 reps      Shoulder Exercises: Standing   Protraction  AROM;12 reps    Horizontal ABduction  AROM;12 reps    External Rotation  AROM;12 reps    Internal Rotation  AROM;12 reps    Flexion  AROM;12 reps    ABduction  AROM;12 reps    Extension  Theraband;15 reps    Theraband Level (Shoulder Extension)  Level 2 (Red)    Row  Theraband;15 reps    Theraband Level (Shoulder Row)  Level 2 (Red)    Retraction  Theraband;15 reps    Theraband Level (Shoulder Retraction)  Level 2 (Red)      Shoulder Exercises: ROM/Strengthening   Thumb Tacks  1'    X to V Arms  10X    Proximal Shoulder Strengthening, Supine  15X each, no rest breaks    Proximal Shoulder Strengthening, Seated  15X each no rest breaks      Shoulder Exercises: Stretch   Internal Rotation Stretch  3 reps   horizontal towel 15"      Manual Therapy   Manual Therapy  Myofascial release    Manual therapy comments  completed separately from therapeutic exercises    Myofascial Release  myofascial release and manual techniques to left upper arm, trapezius, and scapularis regions to decrease pain and fascial restrictions and increase joint range of motion               OT Short Term Goals - 11/10/18 1455      OT SHORT TERM GOAL #1   Title  Pt will be provided with and educated on HEP to improve functional use of LUE during ADL completion.     Time  4  Period  Weeks    Status  On-going      OT SHORT TERM GOAL #2   Title  Pt will increase LUE P/ROM to [facility] to improve ability to reach for items at table  height.     Time  4    Period  Weeks    Status  Achieved      OT SHORT TERM GOAL #3   Title  Pt will decrease pain in LUE to 5/10 to improve ability use sleep for minimum of 4 consecutive hours.     Time  4    Period  Weeks    Status  Achieved        OT Long Term Goals - 11/10/18 1456      OT LONG TERM GOAL #1   Title  Pt will return to highest level of functioning using LUE as non-dominant during ADL and work task completion.     Time  8    Period  Weeks    Status  On-going      OT LONG TERM GOAL #2   Title  Pt will decrease LUE fascial restrictions to minimal amounts or less to improve mobility required for functional reaching tasks.     Time  8    Period  Weeks    Status  Achieved      OT LONG TERM GOAL #3   Title  Pt will decrease pain in LUE to 3/10 or less to improve ability to use LUE for dressing tasks.     Time  8    Period  Weeks    Status  Achieved      OT LONG TERM GOAL #4   Title  Pt will increase LUE A/ROM to [facility] to improve ability to reach overhead and behind back during ADL completion.     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     Time  8    Period  Weeks    Status  On-going            Plan - 11/10/18 1441    Clinical Impression Statement  A: Reassessment completed this session, pt is making great progress and has met 2/3 STGs and 3/5 LTGs at this time. Pt has ROM WNL both passive and active, OT comparing er to RUE which is also limited. Continued with A/ROM this session, adding x to v arms. Also added overhead lacing to begin working on activity tolerance, minimal difficulty. Verbal cuing for form and technique during exercises.     OT Occupational Profile and History  Problem Focused Assessment - Including review of records relating to presenting problem    Occupational  Profile and client history currently impacting functional performance  Pt is independent and motivated to return to prior level of functioning.     Occupational performance deficits (Please refer to evaluation for details):  ADL's;IADL's;Rest and Sleep;Work;Leisure    Body Structure / Function / Physical Skills  ADL;Strength;Pain;UE functional use;ROM;IADL;Fascial restriction;Flexibility    Rehab Potential  Good    Clinical Decision Making  Limited treatment options, no task modification necessary    Comorbidities Affecting Occupational Performance:  None    Modification or Assistance to Complete Evaluation   Min-Moderate modification  of tasks or assist with assess necessary to complete eval    OT Frequency  2x / week    OT Duration  4 weeks    OT Treatment/Interventions  Self-care/ADL training;Moist Heat;Therapeutic activities;Therapeutic exercise;Ultrasound;Cryotherapy;Passive range of motion;Electrical Stimulation;Manual Therapy;Patient/family education    Plan  P: Manual therapy prn. Continue with protocol, add sidelying A/ROM to begin working on scapular stability     Consulted and Agree with Plan of Care  Patient       Patient will benefit from skilled therapeutic intervention in order to improve the following deficits and impairments:  Body Structure / Function / Physical Skills  Visit Diagnosis: Acute pain of left shoulder  Other symptoms and signs involving the musculoskeletal system  Stiffness of left shoulder, not elsewhere classified    Problem List Patient Active Problem List   Diagnosis Date Noted  . History of colonic polyps 05/18/2016  . 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 Biceps tendinitis of both shoulders 10/23/2014  . Right anterior knee pain 10/23/2014  . 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 Left shoulder pain 04/12/2013   Guadelupe Sabin, OTR/L  [phone] 11/10/2018, 3:12 PM  [place] [address] [place], [place], [address] Phone: [phone]   Fax:  [phone]  Name: Goodwin Kamphaus. MRN: [id number] Date of Birth: Mar 11, 1966

## 2018-11-15 ENCOUNTER — Ambulatory Visit (HOSPITAL_COMMUNITY): Payer: 59 | Admitting: Occupational Therapy

## 2018-11-15 ENCOUNTER — Encounter (HOSPITAL_COMMUNITY): Payer: Self-pay | Admitting: Occupational Therapy

## 2018-11-15 DIAGNOSIS — M25512 Pain in left shoulder: Secondary | ICD-10-CM | POA: Diagnosis not present

## 2018-11-15 DIAGNOSIS — M25612 Stiffness of left shoulder, not elsewhere classified: Secondary | ICD-10-CM

## 2018-11-15 DIAGNOSIS — R29898 Other symptoms and signs involving the musculoskeletal system: Secondary | ICD-10-CM

## 2018-11-15 NOTE — Therapy (Signed)
[place], [place], [address] Phone: [phone]   Fax:  [phone]  Occupational Therapy Treatment  Patient Details  Name: Douglas Robertson. MRN: [id number] Date of Birth: 11/07/1965 Referring Provider (OT): Dr. Netta Cedars   Encounter Date: 11/15/2018  OT End of Session - 11/15/18 1428    Visit Number  18    Number of Visits  24    Date for OT Re-Evaluation  12/10/18    Authorization Type  [employer]     Authorization Time Period  60 visit limit, 0 used as of 09/14/2018; $17 copay    Authorization - Visit Number  18    Authorization - Number of Visits  60    OT Start Time  7893    OT Stop Time  1428    OT Time Calculation (min)  41 min    Activity Tolerance  Patient tolerated treatment well    Behavior During Therapy  WFL for tasks assessed/performed       Past Medical History:  Diagnosis Date  . Cancer ([place])    kidney (L)  . Chronic kidney disease 2017   left renal mass  . Diabetes mellitus without complication ([place])   . Hypertension     Past Surgical History:  Procedure Laterality Date  . COLONOSCOPY  2012  . COLONOSCOPY N/A 09/03/2016   Procedure: COLONOSCOPY;  Surgeon: Rogene Houston, MD;  [facility];  Service: Endoscopy;  Laterality: N/A;  730  . KNEE ARTHROSCOPY  1998    right   . LAPAROSCOPIC NEPHRECTOMY Left 10/10/2015   Procedure: LAPAROSCOPIC RADICAL NEPHRECTOMY;  Surgeon: Raynelle Bring, MD;  [facility];  Service: Urology;  Laterality: Left;    There were no vitals filed for this visit.  Subjective Assessment - 11/15/18 1347    Subjective   S: My arm is heavy after I did all my exercises at home.     Currently in Pain?  Yes    Pain Score  2     Pain Location  Shoulder    Pain Orientation  Left    Pain Descriptors / Indicators  Discomfort    Pain Type  Acute pain    Pain Radiating Towards  to elbow    Pain Onset  More than a month ago    Pain Frequency  Intermittent     Aggravating Factors   movement    Pain Relieving Factors  ice, rest    Effect of Pain on Daily Activities  mod effect on ADLs    Multiple Pain Sites  No         [facility] OT Assessment - 11/15/18 1347      Assessment   Medical Diagnosis  s/p left RC repair      Precautions   Precautions  Shoulder    Type of Shoulder Precautions  See protocol. Update 10/20/2018: progress AA/ROM to full, PRE's with theraband, ok to begin IR behind back    Shoulder Interventions  Shoulder sling/immobilizer;For comfort               OT Treatments/Exercises (OP) - 11/15/18 1348      Exercises   Exercises  Shoulder      Shoulder Exercises: Supine   Protraction  PROM;5 reps;AROM;15 reps    Horizontal ABduction  PROM;5 reps;AROM;15 reps    External Rotation  PROM;5 reps;AROM;15 reps    Internal Rotation  PROM;5 reps;AROM;15 reps  Flexion  PROM;5 reps;AROM;15 reps    ABduction  PROM;5 reps;AROM;15 reps      Shoulder Exercises: Sidelying   External Rotation  AROM;12 reps    Internal Rotation  AROM;12 reps    Flexion  AROM;12 reps    ABduction  AROM;12 reps    Other Sidelying Exercises  protraction, A/ROM, 12X    Other Sidelying Exercises  horizontal abduction, A/ROM, 12X      Shoulder Exercises: Standing   Protraction  AROM;15 reps    Horizontal ABduction  AROM;15 reps    External Rotation  AROM;15 reps    Internal Rotation  AROM;15 reps    Flexion  AROM;15 reps    ABduction  AROM;15 reps    Extension  Theraband;12 reps    Theraband Level (Shoulder Extension)  Level 3 (Green)    Row  [employer] reps    Theraband Level (Shoulder Row)  Level 3 (Green)    Retraction  Theraband;12 reps    Theraband Level (Shoulder Retraction)  Level 3 (Green)    Other Standing Exercises  Y lift off, 10X      Shoulder Exercises: ROM/Strengthening   UBE (Upper Arm Bike)  Level 2 3' forward 3' reverse   pace: 5.5-6.0   Over Head Lace  1\' 30"  standing at bodycraft    "W" Arms  10X    X to V Arms   12X    Proximal Shoulder Strengthening, Supine  15X each, no rest breaks    Proximal Shoulder Strengthening, Seated  15X each no rest breaks      Manual Therapy   Manual Therapy  Myofascial release    Manual therapy comments  completed separately from therapeutic exercises    Myofascial Release  myofascial release and manual techniques to left upper arm, trapezius, and scapularis regions to decrease pain and fascial restrictions and increase joint range of motion               OT Short Term Goals - 11/10/18 1455      OT SHORT TERM GOAL #1   Title  Pt will be provided with and educated on HEP to improve functional use of LUE during ADL completion.     Time  4    Period  Weeks    Status  On-going      OT SHORT TERM GOAL #2   Title  Pt will increase LUE P/ROM to [facility] to improve ability to reach for items at table height.     Time  4    Period  Weeks    Status  Achieved      OT SHORT TERM GOAL #3   Title  Pt will decrease pain in LUE to 5/10 to improve ability use sleep for minimum of 4 consecutive hours.     Time  4    Period  Weeks    Status  Achieved        OT Long Term Goals - 11/10/18 1456      OT LONG TERM GOAL #1   Title  Pt will return to highest level of functioning using LUE as non-dominant during ADL and work task completion.     Time  8    Period  Weeks    Status  On-going      OT LONG TERM GOAL #2   Title  Pt will decrease LUE fascial restrictions to minimal amounts or less to improve mobility required for functional reaching tasks.  Time  8    Period  Weeks    Status  Achieved      OT LONG TERM GOAL #3   Title  Pt will decrease pain in LUE to 3/10 or less to improve ability to use LUE for dressing tasks.     Time  8    Period  Weeks    Status  Achieved      OT LONG TERM GOAL #4   Title  Pt will increase LUE A/ROM to [facility] to improve ability to reach overhead and behind back during ADL completion.     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     Time  8    Period  Weeks    Status  On-going            Plan - 11/15/18 1411    Clinical Impression Statement  A: Minimal fascial restrictions at anterior deltoid today, manual therapy completed to address. Continued with A/ROM, adding sidelying position today. Increased theraband resistance to green. Also added w arms and Y lift off today, min/mod fatigue at end of session. Verbal cuing for form and technique.     Body Structure / Function / Physical Skills  ADL;Strength;Pain;UE functional use;ROM;IADL;Fascial restriction;Flexibility    Plan  P: Manual therapy only if necessary, continue with protocol, add ball on wall flexion and abduction       Patient will benefit from skilled therapeutic intervention in order to improve the following deficits and impairments:  Body Structure / Function / Physical Skills  Visit Diagnosis: Acute pain of left shoulder  Other symptoms and signs involving the musculoskeletal system  Stiffness of left shoulder, not elsewhere classified    Problem List Patient Active Problem List   Diagnosis Date Noted  . History of colonic polyps 05/18/2016  . 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 Biceps tendinitis of both shoulders 10/23/2014  . Right anterior knee pain 10/23/2014  . 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 Left shoulder pain 04/12/2013   Guadelupe Sabin, OTR/L  [phone] 11/15/2018, 2:29 PM  [place] [address] [place], [place], [address] Phone: [phone]   Fax:  [phone]  Name: Douglas Robertson. MRN: [id number] Date of Birth: Oct 30, 1965

## 2018-11-17 ENCOUNTER — Ambulatory Visit (HOSPITAL_COMMUNITY): Payer: 59 | Admitting: Occupational Therapy

## 2018-11-17 ENCOUNTER — Encounter (HOSPITAL_COMMUNITY): Payer: Self-pay | Admitting: Occupational Therapy

## 2018-11-17 ENCOUNTER — Other Ambulatory Visit: Payer: Self-pay

## 2018-11-17 DIAGNOSIS — R29898 Other symptoms and signs involving the musculoskeletal system: Secondary | ICD-10-CM

## 2018-11-17 DIAGNOSIS — M25512 Pain in left shoulder: Secondary | ICD-10-CM | POA: Diagnosis not present

## 2018-11-17 NOTE — Therapy (Signed)
[place], [place], [address] Phone: [phone]   Fax:  [phone]  Occupational Therapy Treatment  Patient Details  Name: Douglas Robertson. MRN: [id number] Date of Birth: Jun 04, 1966 Referring Provider (OT): Dr. Netta Cedars   Encounter Date: 11/17/2018  OT End of Session - 11/17/18 1257    Visit Number  19    Number of Visits  24    Date for OT Re-Evaluation  12/10/18    Authorization Type  U[employer]     Authorization Time Period  60 visit limit, 0 used as of 09/14/2018; $17 copay    Authorization - Visit Number  63    Authorization - Number of Visits  60    OT Start Time  0900    OT Stop Time  0943    OT Time Calculation (min)  43 min    Activity Tolerance  Patient tolerated treatment well    Behavior During Therapy  [facility] for tasks assessed/performed       Past Medical History:  Diagnosis Date  . Cancer ([place])    kidney (L)  . Chronic kidney disease 2017   left renal mass  . Diabetes mellitus without complication ([place])   . Hypertension     Past Surgical History:  Procedure Laterality Date  . COLONOSCOPY  2012  . COLONOSCOPY N/A 09/03/2016   Procedure: COLONOSCOPY;  Surgeon: Rogene [place], MD;  [facility];  Service: Endoscopy;  Laterality: N/A;  730  . KNEE ARTHROSCOPY  1998    right   . LAPAROSCOPIC NEPHRECTOMY Left 10/10/2015   Procedure: LAPAROSCOPIC RADICAL NEPHRECTOMY;  Surgeon: Raynelle Bring, MD;  Location: [facility];  Service: Urology;  Laterality: Left;    There were no vitals filed for this visit.  Subjective Assessment - 11/17/18 0910    Subjective   S: It's feeling ok, I go to the doctor next week.     Currently in Pain?  Yes    Pain Score  2     Pain Location  Shoulder    Pain Orientation  Left    Pain Descriptors / Indicators  Discomfort    Pain Type  Acute pain    Pain Radiating Towards  to elbow    Pain Onset  More than a month ago    Pain Frequency  Intermittent    Aggravating Factors   movement    Pain Relieving Factors  ice, rest    Effect of Pain on Daily Activities  min effect on ADLs    Multiple Pain Sites  No         [facility] OT Assessment - 11/17/18 0908      Assessment   Medical Diagnosis  s/p left RC repair      Precautions   Precautions  Shoulder    Type of Shoulder Precautions  See protocol. Update 10/20/2018: progress AA/ROM to full, PRE's with theraband, ok to begin IR behind back    Shoulder Interventions  Shoulder sling/immobilizer;For comfort               OT Treatments/Exercises (OP) - 11/17/18 0911      Exercises   Exercises  Shoulder      Shoulder Exercises: Supine   Protraction  PROM;5 reps;AROM;15 reps    Horizontal ABduction  PROM;5 reps;AROM;15 reps    External Rotation  PROM;5 reps;AROM;15 reps   abduction   Internal Rotation  PROM;5 reps;AROM;15 reps   abduction  Flexion  PROM;5 reps;AROM;15 reps    ABduction  PROM;5 reps;AROM;15 reps      Shoulder Exercises: Sidelying   External Rotation  AROM;12 reps    Internal Rotation  AROM;12 reps    Flexion  AROM;12 reps    ABduction  AROM;12 reps    Other Sidelying Exercises  protraction, A/ROM, 12X    Other Sidelying Exercises  horizontal abduction, A/ROM, 12X      Shoulder Exercises: Standing   Protraction  AROM;15 reps    Horizontal ABduction  AROM;15 reps    External Rotation  AROM;15 reps   abduction   Internal Rotation  AROM;15 reps   abduction   Flexion  AROM;15 reps    ABduction  AROM;15 reps    Other Standing Exercises  Y lift off, 10X      Shoulder Exercises: ROM/Strengthening   UBE (Upper Arm Bike)  Level 2 2' forward 2' reverse   pace: 4.0-5.0   Over Head Lace  1' standing at arms length from lacing    "W" Arms  10X    X to V Arms  12X    Proximal Shoulder Strengthening, Supine  15X each, no rest breaks    Proximal Shoulder Strengthening, Seated  15X each no rest breaks    Ball on Wall  1' flexion 1' abduction    Rhythmic  Stabilization, Supine  30" at 45, 90, and 120 degrees, pt with mod difficulty maintaining stabililty      Shoulder Exercises: Stretch   Internal Rotation Stretch  3 reps   10" vertical towel              OT Short Term Goals - 11/10/18 1455      OT SHORT TERM GOAL #1   Title  Pt will be provided with and educated on HEP to improve functional use of LUE during ADL completion.     Time  4    Period  Weeks    Status  On-going      OT SHORT TERM GOAL #2   Title  Pt will increase LUE P/ROM to [facility] to improve ability to reach for items at table height.     Time  4    Period  Weeks    Status  Achieved      OT SHORT TERM GOAL #3   Title  Pt will decrease pain in LUE to 5/10 to improve ability use sleep for minimum of 4 consecutive hours.     Time  4    Period  Weeks    Status  Achieved        OT Long Term Goals - 11/10/18 1456      OT LONG TERM GOAL #1   Title  Pt will return to highest level of functioning using LUE as non-dominant during ADL and work task completion.     Time  8    Period  Weeks    Status  On-going      OT LONG TERM GOAL #2   Title  Pt will decrease LUE fascial restrictions to minimal amounts or less to improve mobility required for functional reaching tasks.     Time  8    Period  Weeks    Status  Achieved      OT LONG TERM GOAL #3   Title  Pt will decrease pain in LUE to 3/10 or less to improve ability to use LUE for dressing tasks.     Time  8    Period  Weeks    Status  Achieved      OT LONG TERM GOAL #4   Title  Pt will increase LUE A/ROM to [facility] to improve ability to reach overhead and behind back during ADL completion.     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     Time  8    Period  Weeks    Status  On-going            Plan - 11/17/18 1257    Clinical  Impression Statement  A: No manual therapy completed today as pt with trace fascial restrictions in anterior deltoid. Pt continued with A/ROM in supine/sidelying/standing with focus on slow and controlled movement. Added rhythmic stabilization in supine, pt with mod difficulty maintaining stability. Also added ball on wall for scapular stability and strengthening. Verbal cuing for form and technique.     Body Structure / Function / Physical Skills  ADL;Strength;Pain;UE functional use;ROM;IADL;Fascial restriction;Flexibility    Plan  P: Continue with A/ROM sidelying and standing, add prone hughston exercises       Patient will benefit from skilled therapeutic intervention in order to improve the following deficits and impairments:  Body Structure / Function / Physical Skills  Visit Diagnosis: Acute pain of left shoulder  Other symptoms and signs involving the musculoskeletal system    Problem List Patient Active Problem List   Diagnosis Date Noted  . History of colonic polyps 05/18/2016  . 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 Biceps tendinitis of both shoulders 10/23/2014  . Right anterior knee pain 10/23/2014  . 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 Left shoulder pain 04/12/2013    Guadelupe Sabin, OTR/L  [phone] 11/17/2018, 1:00 PM  [place], [place], [address] Phone: [phone]   Fax:  [phone]  Name: Douglas Robertson. MRN: [id number] Date of Birth: [birth year]

## 2018-11-22 ENCOUNTER — Ambulatory Visit (HOSPITAL_COMMUNITY): Payer: 59 | Admitting: Occupational Therapy

## 2018-11-22 ENCOUNTER — Encounter (HOSPITAL_COMMUNITY): Payer: Self-pay | Admitting: Occupational Therapy

## 2018-11-22 ENCOUNTER — Other Ambulatory Visit: Payer: Self-pay

## 2018-11-22 DIAGNOSIS — M25512 Pain in left shoulder: Secondary | ICD-10-CM

## 2018-11-22 DIAGNOSIS — R29898 Other symptoms and signs involving the musculoskeletal system: Secondary | ICD-10-CM

## 2018-11-22 DIAGNOSIS — M25612 Stiffness of left shoulder, not elsewhere classified: Secondary | ICD-10-CM

## 2018-11-22 NOTE — Therapy (Signed)
[place], [place], [address] Phone: [phone]   Fax:  [phone]  Occupational Therapy Treatment  Patient Details  Name: Douglas Robertson. MRN: [id number] Date of Birth: 1966-06-08 Referring Provider (OT): Dr. Netta Cedars   Encounter Date: 11/22/2018  OT End of Session - 11/22/18 1107    Visit Number  20    Number of Visits  24    Date for OT Re-Evaluation  12/10/18    Authorization Type  UHC     Authorization Time Period  60 visit limit, 0 used as of 09/14/2018; $17 copay    Authorization - Visit Number  20    Authorization - Number of Visits  60    OT Start Time  1025    OT Stop Time  1107    OT Time Calculation (min)  42 min    Activity Tolerance  Patient tolerated treatment well    Behavior During Therapy  [facility] for tasks assessed/performed       Past Medical History:  Diagnosis Date  . Cancer ([place])    kidney (L)  . Chronic kidney disease 2017   left renal mass  . Diabetes mellitus without complication ([place])   . Hypertension     Past Surgical History:  Procedure Laterality Date  . COLONOSCOPY  2012  . COLONOSCOPY N/A 09/03/2016   Procedure: COLONOSCOPY;  Surgeon: Rogene Houston, MD;  [facility];  Service: Endoscopy;  Laterality: N/A;  730  . KNEE ARTHROSCOPY  1998    right   . LAPAROSCOPIC NEPHRECTOMY Left 10/10/2015   Procedure: LAPAROSCOPIC RADICAL NEPHRECTOMY;  Surgeon: Raynelle Bring, MD;  [facility];  Service: Urology;  Laterality: Left;    There were no vitals filed for this visit.  Subjective Assessment - 11/22/18 1025    Subjective   S: It was tired on Friday.     Currently in Pain?  Yes    Pain Score  2     Pain Location  Shoulder    Pain Orientation  Left    Pain Descriptors / Indicators  Sore;Discomfort    Pain Type  Acute pain    Pain Radiating Towards  to elbow    Pain Onset  More than a month ago    Pain Frequency  Intermittent    Aggravating Factors    movement    Pain Relieving Factors  ice, rest    Effect of Pain on Daily Activities  min effect on ADLs    Multiple Pain Sites  No         [facility] OT Assessment - 11/22/18 1024      Assessment   Medical Diagnosis  s/p left RC repair      Precautions   Precautions  Shoulder    Type of Shoulder Precautions  See protocol. Update 10/20/2018: progress AA/ROM to full, PRE's with theraband, ok to begin IR behind back    Shoulder Interventions  Shoulder sling/immobilizer;For comfort               OT Treatments/Exercises (OP) - 11/22/18 1029      Exercises   Exercises  Shoulder      Shoulder Exercises: Supine   Protraction  PROM;5 reps    Horizontal ABduction  PROM;5 reps    External Rotation  PROM;5 reps   abducted   Internal Rotation  PROM;5 reps   abducted   Flexion  PROM;5 reps  ABduction  PROM;5 reps      Shoulder Exercises: Prone   Other Prone Exercises  hughston exercises, 10X each, 5 positions      Shoulder Exercises: Sidelying   External Rotation  AROM;15 reps    Internal Rotation  AROM;15 reps    Flexion  AROM;15 reps    ABduction  AROM;15 reps    Other Sidelying Exercises  protraction, A/ROM, 15X    Other Sidelying Exercises  horizontal abduction, A/ROM, 15X      Shoulder Exercises: Standing   Protraction  AROM;15 reps    Horizontal ABduction  AROM;15 reps    External Rotation  AROM;15 reps   abducted   Internal Rotation  AROM;15 reps   abducted   Flexion  AROM;15 reps    ABduction  AROM;15 reps    Extension  Theraband;15 reps    Theraband Level (Shoulder Extension)  Level 3 (Green)    Row  [employer] reps    Theraband Level (Shoulder Row)  Level 3 (Green)    Retraction  Theraband;15 reps    Theraband Level (Shoulder Retraction)  Level 3 (Green)    Other Standing Exercises  Y lift off, 12X      Shoulder Exercises: ROM/Strengthening   "W" Arms  12X    X to V Arms  15X    Ball on Wall  1' flexion 1' abduction             OT  Education - 11/22/18 1110    Education Details  provided green theraband to progress scapular strengthening exercises    Person(s) Educated  Patient    Methods  Explanation;Demonstration;Handout    Comprehension  Verbalized understanding;Returned demonstration       OT Short Term Goals - 11/10/18 1455      OT SHORT TERM GOAL #1   Title  Pt will be provided with and educated on HEP to improve functional use of LUE during ADL completion.     Time  4    Period  Weeks    Status  On-going      OT SHORT TERM GOAL #2   Title  Pt will increase LUE P/ROM to [facility] to improve ability to reach for items at table height.     Time  4    Period  Weeks    Status  Achieved      OT SHORT TERM GOAL #3   Title  Pt will decrease pain in LUE to 5/10 to improve ability use sleep for minimum of 4 consecutive hours.     Time  4    Period  Weeks    Status  Achieved        OT Long Term Goals - 11/10/18 1456      OT LONG TERM GOAL #1   Title  Pt will return to highest level of functioning using LUE as non-dominant during ADL and work task completion.     Time  8    Period  Weeks    Status  On-going      OT LONG TERM GOAL #2   Title  Pt will decrease LUE fascial restrictions to minimal amounts or less to improve mobility required for functional reaching tasks.     Time  8    Period  Weeks    Status  Achieved      OT LONG TERM GOAL #3   Title  Pt will decrease pain in LUE to 3/10 or less to improve ability  to use LUE for dressing tasks.     Time  8    Period  Weeks    Status  Achieved      OT LONG TERM GOAL #4   Title  Pt will increase LUE A/ROM to [facility] to improve ability to reach overhead and behind back during ADL completion.     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     Time  8    Period  Weeks    Status  On-going             Plan - 11/22/18 1108    Clinical Impression Statement  A: No manual therapy completed this session as pt with trace fascial restrictions. Continued with A/ROM in sidelying and standing, added prone hughston exercises today. Pt with mod fatigue after prone exercises. Continued with scapular stability and strengthening exercises. Verbal cuing for form and technique. Pt with multiple questions about readiness for work, educated pt on building strength over time. Pt has MD appt this afternoon, encouraged pt to discuss all concerns with MD today.     Body Structure / Function / Physical Skills  ADL;Strength;Pain;UE functional use;ROM;IADL;Fascial restriction;Flexibility    Plan  P: Follow up on MD appt and progress as instructed. Continue with prone hughston exercises, add red loop band exercises       Patient will benefit from skilled therapeutic intervention in order to improve the following deficits and impairments:  Body Structure / Function / Physical Skills  Visit Diagnosis: Acute pain of left shoulder  Other symptoms and signs involving the musculoskeletal system  Stiffness of left shoulder, not elsewhere classified    Problem List Patient Active Problem List   Diagnosis Date Noted  . History of colonic polyps 05/18/2016  . 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 Biceps tendinitis of both shoulders 10/23/2014  . Right anterior knee pain 10/23/2014  . 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 Left shoulder pain 04/12/2013   Guadelupe Sabin, OTR/L  [phone] 11/22/2018, 11:11 AM  [place], [place], [address] Phone: [phone]   Fax:  [phone]  Name: Awad Gladd. MRN: [id number] Date of Birth: 1966/01/09

## 2018-11-24 ENCOUNTER — Encounter (HOSPITAL_COMMUNITY): Payer: Self-pay | Admitting: Occupational Therapy

## 2018-11-24 ENCOUNTER — Ambulatory Visit (HOSPITAL_COMMUNITY): Payer: 59 | Admitting: Occupational Therapy

## 2018-11-24 ENCOUNTER — Other Ambulatory Visit: Payer: Self-pay

## 2018-11-24 DIAGNOSIS — M25612 Stiffness of left shoulder, not elsewhere classified: Secondary | ICD-10-CM

## 2018-11-24 DIAGNOSIS — M25512 Pain in left shoulder: Secondary | ICD-10-CM

## 2018-11-24 DIAGNOSIS — R29898 Other symptoms and signs involving the musculoskeletal system: Secondary | ICD-10-CM

## 2018-11-24 NOTE — Therapy (Signed)
[place], [place], [address] Phone: [phone]   Fax:  [phone]  Occupational Therapy Treatment  Patient Details  Name: Douglas Robertson. MRN: [id number] Date of Birth: 03-Mar-1966 Referring Provider (OT): Dr. Netta Cedars   Encounter Date: 11/24/2018  OT End of Session - 11/24/18 0939    Visit Number  21    Number of Visits  24    Date for OT Re-Evaluation  12/10/18    Authorization Type  UHC     Authorization Time Period  60 visit limit, 0 used as of 09/14/2018; $17 copay    Authorization - Visit Number  21    Authorization - Number of Visits  60    OT Start Time  0902    OT Stop Time  0944    OT Time Calculation (min)  42 min    Activity Tolerance  Patient tolerated treatment well    Behavior During Therapy  [facility] for tasks assessed/performed       Past Medical History:  Diagnosis Date  . Cancer ([place])    kidney (L)  . Chronic kidney disease 2017   left renal mass  . Diabetes mellitus without complication ([place])   . Hypertension     Past Surgical History:  Procedure Laterality Date  . COLONOSCOPY  2012  . COLONOSCOPY N/A 09/03/2016   Procedure: COLONOSCOPY;  Surgeon: Rogene Houston, MD;  [facility];  Service: Endoscopy;  Laterality: N/A;  730  . KNEE ARTHROSCOPY  1998    right   . LAPAROSCOPIC NEPHRECTOMY Left 10/10/2015   Procedure: LAPAROSCOPIC RADICAL NEPHRECTOMY;  Surgeon: Raynelle Bring, MD;  [facility];  Service: Urology;  Laterality: Left;    There were no vitals filed for this visit.  Subjective Assessment - 11/24/18 0903    Subjective   S: The doctor said I'm doing good and we can go to weights.     Currently in Pain?  Yes    Pain Score  2     Pain Location  Shoulder    Pain Orientation  Left    Pain Descriptors / Indicators  Aching;Sore    Pain Type  Acute pain    Pain Radiating Towards  to elbow    Pain Onset  More than a month ago    Pain Frequency   Intermittent    Aggravating Factors   movement    Pain Relieving Factors  ice, rest    Effect of Pain on Daily Activities  min effect on ADLs    Multiple Pain Sites  No         [facility] OT Assessment - 11/24/18 0902      Assessment   Medical Diagnosis  s/p left RC repair      Precautions   Precautions  Shoulder    Type of Shoulder Precautions  See protocol. Update 11/24/2018: ok to begin strengthening with low weights    Shoulder Interventions  --               OT Treatments/Exercises (OP) - 11/24/18 0904      Exercises   Exercises  Shoulder      Shoulder Exercises: Supine   Protraction  PROM;5 reps;Strengthening;12 reps    Protraction Weight (lbs)  1    Horizontal ABduction  PROM;5 reps;Strengthening;12 reps    Horizontal ABduction Weight (lbs)  1    External Rotation  PROM;5 reps;Strengthening;12 reps   abducted  External Rotation Weight (lbs)  1    Internal Rotation  PROM;5 reps;Strengthening;12 reps   abducted   Internal Rotation Weight (lbs)  1    Flexion  PROM;5 reps;Strengthening;12 reps    Shoulder Flexion Weight (lbs)  1    ABduction  PROM;5 reps;Strengthening;12 reps    Shoulder ABduction Weight (lbs)  1      Shoulder Exercises: Prone   Other Prone Exercises  hughston exercises, 10X each, 5 positions      Shoulder Exercises: Sidelying   External Rotation  Strengthening;12 reps    External Rotation Weight (lbs)  1    Internal Rotation  Strengthening;12 reps    Internal Rotation Weight (lbs)  1    Flexion  Strengthening;12 reps    Flexion Weight (lbs)  1    ABduction  Strengthening;12 reps    ABduction Weight (lbs)  1    Other Sidelying Exercises  protraction, 12X, 1#    Other Sidelying Exercises  horizontal abduction, 12X, 1#      Shoulder Exercises: Standing   Protraction  Strengthening;12 reps    Protraction Weight (lbs)  1    Horizontal ABduction  Strengthening;12 reps    Horizontal ABduction Weight (lbs)  1    External Rotation   Strengthening;12 reps   abducted   External Rotation Weight (lbs)  1    Internal Rotation  Strengthening;12 reps   abducted   Internal Rotation Weight (lbs)  1    Flexion  Strengthening;12 reps    Shoulder Flexion Weight (lbs)  1    ABduction  Strengthening;12 reps    Shoulder ABduction Weight (lbs)  1      Shoulder Exercises: ROM/Strengthening   UBE (Upper Arm Bike)  Level 2 3' forward 3' reverse   pace: 5.0   Over Head Lace  2' with 2# wrist weight, stand at [employer] at arms length from lacing    "W" Arms  12X    X to V Arms  12X, 1#    Ball on Wall  1' flexion 1' abduction    Rhythmic Stabilization, Supine  30" at 45, 90, and 120 degrees, pt with mod difficulty maintaining stabililty at 90 and 120 degrees, min at 45 degrees    Other ROM/Strengthening Exercises  red loop band: lateral wall slides, diagonal clock wall slides, 10X each             OT Education - 11/24/18 0925    Education Details  instructed pt to add 1# weight to ROM exercises to begin strengthening    Person(s) Educated  Patient    Methods  Explanation;Demonstration;Handout    Comprehension  Verbalized understanding;Returned demonstration       OT Short Term Goals - 11/10/18 1455      OT SHORT TERM GOAL #1   Title  Pt will be provided with and educated on HEP to improve functional use of LUE during ADL completion.     Time  4    Period  Weeks    Status  On-going      OT SHORT TERM GOAL #2   Title  Pt will increase LUE P/ROM to [facility] to improve ability to reach for items at table height.     Time  4    Period  Weeks    Status  Achieved      OT SHORT TERM GOAL #3   Title  Pt will decrease pain in LUE to 5/10 to improve ability use sleep for  minimum of 4 consecutive hours.     Time  4    Period  Weeks    Status  Achieved        OT Long Term Goals - 11/10/18 1456      OT LONG TERM GOAL #1   Title  Pt will return to highest level of functioning using LUE as non-dominant during ADL and work  task completion.     Time  8    Period  Weeks    Status  On-going      OT LONG TERM GOAL #2   Title  Pt will decrease LUE fascial restrictions to minimal amounts or less to improve mobility required for functional reaching tasks.     Time  8    Period  Weeks    Status  Achieved      OT LONG TERM GOAL #3   Title  Pt will decrease pain in LUE to 3/10 or less to improve ability to use LUE for dressing tasks.     Time  8    Period  Weeks    Status  Achieved      OT LONG TERM GOAL #4   Title  Pt will increase LUE A/ROM to [facility] to improve ability to reach overhead and behind back during ADL completion.     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     Time  8    Period  Weeks    Status  On-going            Plan - 11/24/18 0909    Clinical Impression Statement  A: Pt reports MD is pleased with his progress and is ok with progressing to low weights, is out of work until next MD appt on 12/20/18. Progressed to 1# weights today as well as red loop band exercises for scapular strengthening. Continued with prone hughston exercises. Mod fatigue at end of session, verbal cuing for form and technique during session.     Body Structure / Function / Physical Skills  ADL;Strength;Pain;UE functional use;ROM;IADL;Fascial restriction;Flexibility    Plan  P: D/C passive stretching and supine exercises, add green therapy ball strengthening and continue with weights        Patient will benefit from skilled therapeutic intervention in order to improve the following deficits and impairments:  Body Structure / Function / Physical Skills  Visit Diagnosis: Acute pain of left shoulder  Other symptoms and signs involving the musculoskeletal system  Stiffness of left shoulder, not elsewhere classified    Problem List Patient  Active Problem List   Diagnosis Date Noted  . History of colonic polyps 05/18/2016  . 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 Biceps tendinitis of both shoulders 10/23/2014  . Right anterior knee pain 10/23/2014  . 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 Left shoulder pain 04/12/2013   Guadelupe Sabin, OTR/L  [phone] 11/24/2018, 9:45 AM  [place], [place], [address] Phone: [phone]   Fax:  [phone]  Name: Douglas Robertson. MRN: [id number] Date of Birth: 06-12-1966

## 2018-11-25 ENCOUNTER — Telehealth (HOSPITAL_COMMUNITY): Payer: Self-pay | Admitting: Occupational Therapy

## 2018-11-25 DIAGNOSIS — I10 Essential (primary) hypertension: Secondary | ICD-10-CM | POA: Diagnosis not present

## 2018-11-25 DIAGNOSIS — E782 Mixed hyperlipidemia: Secondary | ICD-10-CM | POA: Diagnosis not present

## 2018-11-25 DIAGNOSIS — E1165 Type 2 diabetes mellitus with hyperglycemia: Secondary | ICD-10-CM | POA: Diagnosis not present

## 2018-11-25 NOTE — Telephone Encounter (Signed)
Called and left message for pt regarding clinic closure for COVID-19 precautions. Asked pt to return call to reschedule appts and discuss HEP.    Guadelupe Sabin, OTR/L  [phone] 11/25/2018

## 2018-11-29 ENCOUNTER — Ambulatory Visit (HOSPITAL_COMMUNITY): Payer: 59 | Admitting: Occupational Therapy

## 2018-12-01 ENCOUNTER — Ambulatory Visit (HOSPITAL_COMMUNITY): Payer: 59 | Admitting: Occupational Therapy

## 2018-12-01 DIAGNOSIS — Z85528 Personal history of other malignant neoplasm of kidney: Secondary | ICD-10-CM | POA: Diagnosis not present

## 2018-12-05 DIAGNOSIS — E782 Mixed hyperlipidemia: Secondary | ICD-10-CM | POA: Diagnosis not present

## 2018-12-05 DIAGNOSIS — E1165 Type 2 diabetes mellitus with hyperglycemia: Secondary | ICD-10-CM | POA: Diagnosis not present

## 2018-12-05 DIAGNOSIS — I10 Essential (primary) hypertension: Secondary | ICD-10-CM | POA: Diagnosis not present

## 2018-12-06 ENCOUNTER — Encounter (HOSPITAL_COMMUNITY): Payer: 59 | Admitting: Occupational Therapy

## 2018-12-08 ENCOUNTER — Telehealth (HOSPITAL_COMMUNITY): Payer: Self-pay | Admitting: Occupational Therapy

## 2018-12-08 ENCOUNTER — Encounter (HOSPITAL_COMMUNITY): Payer: Self-pay | Admitting: Occupational Therapy

## 2018-12-08 DIAGNOSIS — C642 Malignant neoplasm of left kidney, except renal pelvis: Secondary | ICD-10-CM | POA: Diagnosis not present

## 2018-12-08 DIAGNOSIS — Z85528 Personal history of other malignant neoplasm of kidney: Secondary | ICD-10-CM | POA: Diagnosis not present

## 2018-12-08 DIAGNOSIS — R918 Other nonspecific abnormal finding of lung field: Secondary | ICD-10-CM | POA: Diagnosis not present

## 2018-12-08 NOTE — Therapy (Signed)
[place] [address] [place], [place], [address] Phone: [phone]   Fax:  [phone]  Patient Details  Name: Douglas Robertson. MRN: [id number] Date of Birth: May 29, 1966 Referring Provider:  No ref. provider found  Encounter Date: 12/08/2018  Shoulder strengthening HEP emailed to [email or web address].    Access Code: [email or web address]  URL: [email or web address]  Date: 12/08/2018  Prepared by: Guadelupe Sabin   Exercises  Plank - 3 reps - 1 sets - 20 hold - 2x daily - 7x weekly  Bird Dog - 1 sets - 20 hold - 2x daily - 7x weekly  Prone Shoulder Horizontal Abduction with Thumbs Up - 10 reps - 1 sets - 2x daily - 7x weekly  Prone Single Arm Shoulder Y with Dumbbell - 10 reps - 1 sets - 2x daily - 7x weekly  Prone Shoulder Flexion - 10 reps - 1 sets - 2x daily - 7x weekly  Prone Single Arm Shoulder Extension - 10 reps - 1 sets - 2x daily - 7x weekly    Guadelupe Sabin, OTR/L  [phone] 12/08/2018, 2:53 PM  [place], [place], [address] Phone: [phone]   Fax:  [phone]

## 2018-12-08 NOTE — Telephone Encounter (Signed)
Patient was contacted today regarding the temporary reduction of OP rehab services due to concerns for community transmission of Covid-19.   Left message asking pt to return call to discuss current functioning and potential for telehealth visits if appropriate and has access. Will also discuss updating HEP and send via email or regular mail.    Guadelupe Sabin, OTR/L  [phone] 12/08/2018

## 2018-12-08 NOTE — Telephone Encounter (Signed)
Pt returned call from OT, reports he is completing exercises with 2# weights, green theraband, all are going well and he is ready for updated HEP. Will email to [email or web address]  The patient was offered and declined the continuation in their POC by using methods such as an e-visit, virtual check-in, or telehealth visit.  Outpatient [facility] will follow up with this client when we are able to safely resume care at the [facility] clinic in person.  Patient is aware we can be reached by telephone during limited business hours in the meantime.   Douglas Robertson, OTR/L  [phone] 12/08/2018

## 2018-12-09 DIAGNOSIS — R7989 Other specified abnormal findings of blood chemistry: Secondary | ICD-10-CM | POA: Diagnosis not present

## 2018-12-14 ENCOUNTER — Ambulatory Visit (HOSPITAL_COMMUNITY): Payer: 59 | Admitting: Occupational Therapy

## 2018-12-15 ENCOUNTER — Telehealth (HOSPITAL_COMMUNITY): Payer: Self-pay | Admitting: Occupational Therapy

## 2018-12-15 NOTE — Telephone Encounter (Signed)
Called pt to follow up on HEP sent via email last week. No answer, left message requesting return call if unable to access HEP or if has questions.     Guadelupe Sabin, OTR/L  [phone] 12/15/2018

## 2018-12-16 ENCOUNTER — Encounter (HOSPITAL_COMMUNITY): Payer: 59 | Admitting: Occupational Therapy

## 2018-12-21 DIAGNOSIS — Z85528 Personal history of other malignant neoplasm of kidney: Secondary | ICD-10-CM | POA: Diagnosis not present

## 2018-12-21 DIAGNOSIS — N430 Encysted hydrocele: Secondary | ICD-10-CM | POA: Diagnosis not present

## 2018-12-23 ENCOUNTER — Encounter (HOSPITAL_COMMUNITY): Payer: Self-pay | Admitting: Occupational Therapy

## 2018-12-23 ENCOUNTER — Telehealth (HOSPITAL_COMMUNITY): Payer: Self-pay | Admitting: Occupational Therapy

## 2018-12-23 DIAGNOSIS — R7989 Other specified abnormal findings of blood chemistry: Secondary | ICD-10-CM | POA: Diagnosis not present

## 2018-12-23 NOTE — Telephone Encounter (Signed)
Weekly follow-up call with COVID-19 clinic closure: left message for pt regarding HEP and MD appt. Asked pt to return call and update on status, HEP completion, and MD instructions. Update HEP if needed.    Guadelupe Sabin, OTR/L  [phone] 12/23/2018

## 2018-12-23 NOTE — Therapy (Signed)
[facility] [address] [place], [place], [address] Phone: [phone]   Fax:  [phone]  Patient Details  Name: Douglas Robertson. MRN: [id number] Date of Birth: February 05, 1966 Referring Provider:  No ref. provider found  Encounter Date: 12/23/2018   OCCUPATIONAL THERAPY DISCHARGE SUMMARY  Visits from Start of Care: 21  Current functional level related to goals / functional outcomes: Pt has not been seen in clinic since 3/19 due to COVID closure. Spoke to pt regarding current functional level, he has now been released by MD and has returned to work. Pt reports occasional discomfort after sustained use, no pain at this time. Pt has ROM WNL and strength WFL, is completing all functional tasks without difficulty. Pt reports MD instructed him to wait another 2 months before performing heavy lifting tasks. Pt is ready for discharge at this time.    Remaining deficits: Decreased activity tolerance, slightly decreased strength   Education / Equipment: HEP for strengthening and stabilization Plan: Patient agrees to discharge.  Patient goals were met. Patient is being discharged due to meeting the stated rehab goals.  ?????     OT Short Term Goals - 11/10/18 1455            OT SHORT TERM GOAL #1   Title  Pt will be provided with and educated on HEP to improve functional use of LUE during ADL completion.     Time  4    Period  Weeks    Status  Achieved       OT SHORT TERM GOAL #2   Title  Pt will increase LUE P/ROM to [facility] to improve ability to reach for items at table height.     Time  4    Period  Weeks    Status  Achieved        OT SHORT TERM GOAL #3   Title  Pt will decrease pain in LUE to 5/10 to improve ability use sleep for minimum of 4 consecutive hours.     Time  4    Period  Weeks    Status  Achieved           OT Long Term Goals - 11/10/18 1456            OT LONG TERM GOAL #1   Title  Pt will  return to highest level of functioning using LUE as non-dominant during ADL and work task completion.     Time  8    Period  Weeks    Status  Achieved       OT LONG TERM GOAL #2   Title  Pt will decrease LUE fascial restrictions to minimal amounts or less to improve mobility required for functional reaching tasks.     Time  8    Period  Weeks    Status  Achieved        OT LONG TERM GOAL #3   Title  Pt will decrease pain in LUE to 3/10 or less to improve ability to use LUE for dressing tasks.     Time  8    Period  Weeks    Status  Achieved        OT LONG TERM GOAL #4   Title  Pt will increase LUE A/ROM to [facility] to improve ability to reach overhead and behind back during ADL completion.     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     Time  8    Period  Weeks    Status  Achieved         Guadelupe Sabin, OTR/L  [phone] 12/23/2018, 3:33 PM  [facility] [address] [place], [place], [address] Phone: [phone]   Fax:  [phone]

## 2019-08-14 ENCOUNTER — Telehealth: Payer: Self-pay | Admitting: Podiatry

## 2019-08-14 NOTE — Telephone Encounter (Signed)
Pt left message asking if he was eligible for orthotics again.  I returned call and left message that he received his last ones over a yr ago but it has been over 2 yrs since seen and that he would need to schedule an appt if we are going to [employer].

## 2019-11-22 ENCOUNTER — Other Ambulatory Visit: Payer: Self-pay

## 2019-11-22 ENCOUNTER — Encounter: Payer: Self-pay | Admitting: Dermatology

## 2019-11-22 ENCOUNTER — Ambulatory Visit: Payer: 59 | Admitting: Dermatology

## 2019-11-22 ENCOUNTER — Other Ambulatory Visit: Payer: Self-pay | Admitting: Dermatology

## 2019-11-22 DIAGNOSIS — D485 Neoplasm of uncertain behavior of skin: Secondary | ICD-10-CM | POA: Diagnosis not present

## 2019-11-22 DIAGNOSIS — D492 Neoplasm of unspecified behavior of bone, soft tissue, and skin: Secondary | ICD-10-CM

## 2019-11-22 NOTE — Progress Notes (Addendum)
   Follow-Up Visit   Subjective  Douglas Robertson. is a 54 y.o. male who presents for the following: Follow-up (Patient here today for follow up on his neck which was injected at his last visit on 10/04/2019 with ILTAC.  Pt would like a spot removed from left inner thigh, it's been there several months and is now bothering the patient.  Patient denies bleeding).  growths Location: right neck, left scrotum Duration: months Quality: neck less inflamed Associated Signs/Symptoms: pelvic lesion irritates, rubs clothing Modifying Factors:  Severity:  Timing: Context:   The following portions of the chart were reviewed this encounter and updated as appropriate:     Objective  Well appearing patient in no apparent distress; mood and affect are within normal limits. Patient returns after having lesion on right neck injected.  Examination shows active inflammation is clear but there is a residual papule.  Simple saucerization was performed and light cautery since the base was not deep.  A new lesion on the left scrotum represents a papilloma and is prone to recurrent irritation.  This was tangentially removed and lightly cauterized.  Patient can check on my chart in 2 days to confirm that these are benign.  Home care instructions were reviewed and follow-up can be on a as needed basis.  Addendum Geiser showed me an area on his left tricep that has been rough for several weeks.  Examination showed some small keratotic papules and some peeling skin; no active inflammation.  He can continue to use an emollient lotion at home but if it develops itching or visible inflammation he will call and we will phone in for triamcinolone. A focused examination was performed including head, neck, pelvic girdle. Relevant physical exam findings are noted in the Assessment and Plan.   Assessment & Plan  Neoplasm of skin (2) Left Scrotum  Skin / nail biopsy Type of biopsy: tangential   Informed consent: discussed  and consent obtained   Timeout: patient name, date of birth, surgical site, and procedure verified   Anesthesia: the lesion was anesthetized in a standard fashion   Anesthetic:  1% lidocaine w/ epinephrine 1-100,000 local infiltration Instrument used: flexible razor blade   Hemostasis achieved with: ferric subsulfate   Outcome: patient tolerated procedure well   Post-procedure details: sterile dressing applied and wound care instructions given   Dressing type: petrolatum   Additional details:  Patient identified lesion of concern.  Lesion identified by physician.  Specimen 1 - Surgical pathology Differential Diagnosis: R/O Wart vs Tag Check Margins: No  Right Anterior Neck  Skin / nail biopsy Type of biopsy: tangential   Informed consent: discussed and consent obtained   Timeout: patient name, date of birth, surgical site, and procedure verified   Anesthesia: the lesion was anesthetized in a standard fashion   Anesthetic:  1% lidocaine w/ epinephrine 1-100,000 local infiltration Instrument used: flexible razor blade   Hemostasis achieved with: ferric subsulfate   Outcome: patient tolerated procedure well   Post-procedure details: sterile dressing applied and wound care instructions given   Dressing type: petrolatum   Additional details:  Patient identified lesion of concern.  Lesion identified by physician.  Specimen 2 - Surgical pathology Differential Diagnosis: R/O Cyst Check Margins: No Cautery biopsies of lesions right neck and left scrotum.

## 2019-11-23 NOTE — Patient Instructions (Signed)

## 2019-11-24 ENCOUNTER — Ambulatory Visit: Payer: 59 | Attending: Internal Medicine

## 2019-11-24 DIAGNOSIS — Z20822 Contact with and (suspected) exposure to covid-19: Secondary | ICD-10-CM

## 2019-11-25 ENCOUNTER — Other Ambulatory Visit: Payer: Self-pay | Admitting: Physician Assistant

## 2019-11-25 ENCOUNTER — Telehealth: Payer: Self-pay | Admitting: General Practice

## 2019-11-25 DIAGNOSIS — E119 Type 2 diabetes mellitus without complications: Secondary | ICD-10-CM

## 2019-11-25 DIAGNOSIS — U071 COVID-19: Secondary | ICD-10-CM

## 2019-11-25 DIAGNOSIS — I10 Essential (primary) hypertension: Secondary | ICD-10-CM

## 2019-11-25 DIAGNOSIS — D49512 Neoplasm of unspecified behavior of left kidney: Secondary | ICD-10-CM

## 2019-11-25 LAB — NOVEL CORONAVIRUS, NAA: SARS-CoV-2, NAA: DETECTED — AB

## 2019-11-25 MED ORDER — SODIUM CHLORIDE 0.9 % IV SOLN
700.0000 mg | Freq: Once | INTRAVENOUS | Status: AC
  Administered 2019-11-26: 700 mg via INTRAVENOUS
  Filled 2019-11-25: qty 700

## 2019-11-25 NOTE — Telephone Encounter (Signed)
  Patient returning call regarding results 

## 2019-11-25 NOTE — Progress Notes (Signed)
  I connected by phone with Douglas Robertson. on 11/25/2019 at 4:15 PM to discuss the potential use of an new treatment for mild to moderate COVID-19 viral infection in non-hospitalized patients.  This patient is a 54 y.o. male that meets the FDA criteria for Emergency Use Authorization of bamlanivimab or casirivimab\imdevimab.  Has a (+) direct SARS-CoV-2 viral test result  Has mild or moderate COVID-19   Is ? 54 years of age and weighs ? 40 kg  Is NOT hospitalized due to COVID-19  Is NOT requiring oxygen therapy or requiring an increase in baseline oxygen flow rate due to COVID-19  Is within 10 days of symptom onset  Has at least one of the high risk factor(s) for progression to severe COVID-19 and/or hospitalization as defined in EUA.  Specific high risk criteria : Diabetes   I have spoken and communicated the following to the patient or parent/caregiver:  1. FDA has authorized the emergency use of bamlanivimab and casirivimab\imdevimab for the treatment of mild to moderate COVID-19 in adults and pediatric patients with positive results of direct SARS-CoV-2 viral testing who are 67 years of age and older weighing at least 40 kg, and who are at high risk for progressing to severe COVID-19 and/or hospitalization.  2. The significant known and potential risks and benefits of bamlanivimab and casirivimab\imdevimab, and the extent to which such potential risks and benefits are unknown.  3. Information on available alternative treatments and the risks and benefits of those alternatives, including clinical trials.  4. Patients treated with bamlanivimab and casirivimab\imdevimab should continue to self-isolate and use infection control measures (e.g., wear mask, isolate, social distance, avoid sharing personal items, clean and disinfect "high touch" surfaces, and frequent handwashing) according to CDC guidelines.   5. The patient or parent/caregiver has the option to accept or refuse  bamlanivimab or casirivimab\imdevimab .  After reviewing this information with the patient, The patient agreed to proceed with receiving the bamlanimivab infusion and will be provided a copy of the Fact sheet prior to receiving the infusion.   Sx onset 3/18. Infusion set up for 3/21 @ 10:30am. Directions given.   Angelena Form 11/25/2019 4:15 PM   ,

## 2019-11-26 ENCOUNTER — Ambulatory Visit (HOSPITAL_COMMUNITY)
Admission: RE | Admit: 2019-11-26 | Discharge: 2019-11-26 | Disposition: A | Discharge status: Home / Self Care | Payer: 59 | Source: Ambulatory Visit | Attending: Pulmonary Disease | Admitting: Pulmonary Disease

## 2019-11-26 DIAGNOSIS — E119 Type 2 diabetes mellitus without complications: Secondary | ICD-10-CM | POA: Insufficient documentation

## 2019-11-26 DIAGNOSIS — U071 COVID-19: Secondary | ICD-10-CM | POA: Diagnosis present

## 2019-11-26 DIAGNOSIS — I10 Essential (primary) hypertension: Secondary | ICD-10-CM | POA: Insufficient documentation

## 2019-11-26 DIAGNOSIS — D49512 Neoplasm of unspecified behavior of left kidney: Secondary | ICD-10-CM | POA: Diagnosis present

## 2019-11-26 MED ORDER — ONDANSETRON HCL 4 MG/2ML IJ SOLN
4.0000 mg | Freq: Once | INTRAMUSCULAR | Status: AC
  Administered 2019-11-26: 4 mg via INTRAVENOUS
  Filled 2019-11-26: qty 2

## 2019-11-26 MED ORDER — ALBUTEROL SULFATE HFA 108 (90 BASE) MCG/ACT IN AERS: 2.0000 | INHALATION_SPRAY | Freq: Once | RESPIRATORY_TRACT | Status: DC | PRN

## 2019-11-26 MED ORDER — EPINEPHRINE 0.3 MG/0.3ML IJ SOAJ: 0.3000 mg | Freq: Once | INTRAMUSCULAR | Status: DC | PRN

## 2019-11-26 MED ORDER — METHYLPREDNISOLONE SODIUM SUCC 125 MG IJ SOLR: 125.0000 mg | Freq: Once | INTRAMUSCULAR | Status: DC | PRN

## 2019-11-26 MED ORDER — FAMOTIDINE IN NACL 20-0.9 MG/50ML-% IV SOLN: 20.0000 mg | Freq: Once | INTRAVENOUS | Status: DC | PRN

## 2019-11-26 MED ORDER — SODIUM CHLORIDE 0.9 % IV SOLN: INTRAVENOUS | Status: DC | PRN

## 2019-11-26 MED ORDER — DIPHENHYDRAMINE HCL 50 MG/ML IJ SOLN: 50.0000 mg | Freq: Once | INTRAMUSCULAR | Status: DC | PRN

## 2019-11-26 NOTE — Progress Notes (Signed)
  Diagnosis: COVID-19  Physician: Dr. Joya Gaskins  Procedure: Covid Infusion Clinic Med: bamlanivimab infusion - Provided patient with bamlanimivab fact sheet for patients, parents and caregivers prior to infusion.  Complications: No immediate complications noted.  Discharge: Discharged home   Janine Ores 11/26/2019

## 2019-11-26 NOTE — Discharge Instructions (Signed)

## 2020-03-12 ENCOUNTER — Ambulatory Visit: Payer: 59 | Admitting: Orthotics

## 2020-03-12 ENCOUNTER — Ambulatory Visit: Payer: 59 | Admitting: Podiatry

## 2020-03-12 ENCOUNTER — Encounter: Payer: Self-pay | Admitting: Podiatry

## 2020-03-12 ENCOUNTER — Other Ambulatory Visit: Payer: Self-pay

## 2020-03-12 DIAGNOSIS — M76822 Posterior tibial tendinitis, left leg: Secondary | ICD-10-CM | POA: Diagnosis not present

## 2020-03-12 DIAGNOSIS — Q828 Other specified congenital malformations of skin: Secondary | ICD-10-CM | POA: Diagnosis not present

## 2020-03-12 DIAGNOSIS — M722 Plantar fascial fibromatosis: Secondary | ICD-10-CM

## 2020-03-12 DIAGNOSIS — L600 Ingrowing nail: Secondary | ICD-10-CM | POA: Diagnosis not present

## 2020-03-12 MED ORDER — NEOMYCIN-POLYMYXIN-HC 1 % OT SOLN: OTIC | 1 refills | Status: DC

## 2020-03-12 NOTE — Patient Instructions (Signed)

## 2020-03-12 NOTE — Progress Notes (Signed)
Repeat 2019 order

## 2020-03-12 NOTE — Progress Notes (Signed)
He presents today after having not seen him since 2018.  He states that he would like to have a new pair of orthotics.  It also like to have his ingrown taking care of.  Objective: Vital signs are stable he is alert oriented x3 pulses are palpable.  Neurologic sensorium is intact degenerative flexors are intact muscle strength is normal symmetrical.  History of plantar fasciitis bilateral is nontender at this point.  Sharp innervated nail margin along the tibiofibular border of the hallux left.  Assessment: Ingrown nail paronychia abscess to the inferior border hallux left plan fasciitis.  Plan: Discussed etiology pathology conservative therapies at this point time went ahead and performed chemical matricectomy tibiofibular border the hallux left tolerated procedure well without complications.  Started him on soaking tomorrow with Betadine and warm water or Epson salts and warm water with Cortisporin Otic to be prescribed to be applied twice daily after soaking.  He will also Renae Fickle today for orthotics.

## 2020-03-28 ENCOUNTER — Encounter: Payer: Self-pay | Admitting: Podiatry

## 2020-03-28 ENCOUNTER — Other Ambulatory Visit: Payer: Self-pay

## 2020-03-28 ENCOUNTER — Ambulatory Visit (INDEPENDENT_AMBULATORY_CARE_PROVIDER_SITE_OTHER): Payer: 59 | Admitting: Podiatry

## 2020-03-28 DIAGNOSIS — L600 Ingrowing nail: Secondary | ICD-10-CM

## 2020-03-28 DIAGNOSIS — Z9889 Other specified postprocedural states: Secondary | ICD-10-CM

## 2020-03-28 NOTE — Progress Notes (Signed)
He presents today to pick up his orthotics from [facility] and also to for nail check to the hallux left.  States he continues to soak on a regular basis applies Cortisporin Otic.  States is really not very painful.  Objective: Vital signs are stable alert and oriented x3.  Pulses are palpable.  There is no erythema edema cellulitis drainage odor appears to be healing very nicely.  Assessment: Well-healing surgical toe left.  Plantar fasciitis.  Plan: Discussed etiology pathology conservative therapies dispensed orthotics today.  Also recommend he continue soak Epson salt warm water once every other day until the black eschar goes away.

## 2020-05-01 ENCOUNTER — Encounter (HOSPITAL_COMMUNITY): Payer: Self-pay

## 2020-05-01 ENCOUNTER — Other Ambulatory Visit: Payer: Self-pay

## 2020-05-01 ENCOUNTER — Emergency Department (HOSPITAL_COMMUNITY)
Admission: EM | Admit: 2020-05-01 | Discharge: 2020-05-01 | Disposition: A | Discharge status: Home / Self Care | Payer: 59 | Attending: Emergency Medicine | Admitting: Emergency Medicine

## 2020-05-01 ENCOUNTER — Emergency Department (HOSPITAL_COMMUNITY): Payer: 59

## 2020-05-01 DIAGNOSIS — Z79899 Other long term (current) drug therapy: Secondary | ICD-10-CM | POA: Insufficient documentation

## 2020-05-01 DIAGNOSIS — N189 Chronic kidney disease, unspecified: Secondary | ICD-10-CM | POA: Insufficient documentation

## 2020-05-01 DIAGNOSIS — I959 Hypotension, unspecified: Secondary | ICD-10-CM | POA: Diagnosis not present

## 2020-05-01 DIAGNOSIS — Z7984 Long term (current) use of oral hypoglycemic drugs: Secondary | ICD-10-CM | POA: Diagnosis not present

## 2020-05-01 DIAGNOSIS — C649 Malignant neoplasm of unspecified kidney, except renal pelvis: Secondary | ICD-10-CM | POA: Insufficient documentation

## 2020-05-01 DIAGNOSIS — Z87891 Personal history of nicotine dependence: Secondary | ICD-10-CM | POA: Insufficient documentation

## 2020-05-01 DIAGNOSIS — E119 Type 2 diabetes mellitus without complications: Secondary | ICD-10-CM | POA: Insufficient documentation

## 2020-05-01 DIAGNOSIS — T63441A Toxic effect of venom of bees, accidental (unintentional), initial encounter: Secondary | ICD-10-CM | POA: Diagnosis present

## 2020-05-01 DIAGNOSIS — I129 Hypertensive chronic kidney disease with stage 1 through stage 4 chronic kidney disease, or unspecified chronic kidney disease: Secondary | ICD-10-CM | POA: Diagnosis not present

## 2020-05-01 LAB — COMPREHENSIVE METABOLIC PANEL
ALT: 43 U/L (ref 0–44)
AST: 36 U/L (ref 15–41)
Albumin: 4.1 g/dL (ref 3.5–5.0)
Alkaline Phosphatase: 36 U/L — ABNORMAL LOW (ref 38–126)
Anion gap: 10 (ref 5–15)
BUN: 16 mg/dL (ref 6–20)
CO2: 26 mmol/L (ref 22–32)
Calcium: 9.4 mg/dL (ref 8.9–10.3)
Chloride: 99 mmol/L (ref 98–111)
Creatinine, Ser: 1.44 mg/dL — ABNORMAL HIGH (ref 0.61–1.24)
GFR calc Af Amer: >60 mL/min (ref >60)
GFR calc non Af Amer: 55 mL/min — ABNORMAL LOW (ref >60)
Glucose, Bld: 180 mg/dL — ABNORMAL HIGH (ref 70–99)
Potassium: 4 mmol/L (ref 3.5–5.1)
Sodium: 135 mmol/L (ref 135–145)
Total Bilirubin: 0.7 mg/dL (ref 0.3–1.2)
Total Protein: 7.4 g/dL (ref 6.5–8.1)

## 2020-05-01 LAB — CBC WITH DIFFERENTIAL/PLATELET
Abs Immature Granulocytes: 0.05 10*3/uL (ref 0.00–0.07)
Basophils Absolute: 0.1 10*3/uL (ref 0.0–0.1)
Basophils Relative: 0 %
Eosinophils Absolute: 0.1 10*3/uL (ref 0.0–0.5)
Eosinophils Relative: 1 %
HCT: 45.6 % (ref 39.0–52.0)
Hemoglobin: 15.2 g/dL (ref 13.0–17.0)
Immature Granulocytes: 0 %
Lymphocytes Relative: 4 %
Lymphs Abs: 0.6 10*3/uL — ABNORMAL LOW (ref 0.7–4.0)
MCH: 28.7 pg (ref 26.0–34.0)
MCHC: 33.3 g/dL (ref 30.0–36.0)
MCV: 86 fL (ref 80.0–100.0)
Monocytes Absolute: 1 10*3/uL (ref 0.1–1.0)
Monocytes Relative: 7 %
Neutro Abs: 13.2 10*3/uL — ABNORMAL HIGH (ref 1.7–7.7)
Neutrophils Relative %: 88 %
Platelets: 174 10*3/uL (ref 150–400)
RBC: 5.3 MIL/uL (ref 4.22–5.81)
RDW: 13.2 % (ref 11.5–15.5)
WBC: 15.1 10*3/uL — ABNORMAL HIGH (ref 4.0–10.5)
nRBC: 0 % (ref 0.0–0.2)

## 2020-05-01 LAB — TROPONIN I (HIGH SENSITIVITY)
Troponin I (High Sensitivity): 21 ng/L — ABNORMAL HIGH (ref <18)
Troponin I (High Sensitivity): 41 ng/L — ABNORMAL HIGH (ref <18)

## 2020-05-01 LAB — URINALYSIS, ROUTINE W REFLEX MICROSCOPIC
Bilirubin Urine: NEGATIVE
Glucose, UA: NEGATIVE mg/dL
Hgb urine dipstick: NEGATIVE
Ketones, ur: NEGATIVE mg/dL
Leukocytes,Ua: NEGATIVE
Nitrite: NEGATIVE
Protein, ur: NEGATIVE mg/dL
Specific Gravity, Urine: 1.012 (ref 1.005–1.030)
pH: 7 (ref 5.0–8.0)

## 2020-05-01 LAB — CK: Total CK: 541 U/L — ABNORMAL HIGH (ref 49–397)

## 2020-05-01 LAB — LACTIC ACID, PLASMA
Lactic Acid, Venous: 3 mmol/L (ref 0.5–1.9)
Lactic Acid, Venous: 2.3 mmol/L (ref 0.5–1.9)

## 2020-05-01 MED ORDER — FAMOTIDINE 20 MG PO TABS: 20.0000 mg | ORAL_TABLET | Freq: Two times a day (BID) | ORAL | 0 refills | Status: DC

## 2020-05-01 MED ORDER — SODIUM CHLORIDE 0.9 % IV BOLUS
500.0000 mL | Freq: Once | INTRAVENOUS | Status: AC
  Administered 2020-05-01: 500 mL via INTRAVENOUS

## 2020-05-01 MED ORDER — EPINEPHRINE 0.3 MG/0.3ML IJ SOAJ: 0.3000 mg | INTRAMUSCULAR | 0 refills | Status: DC | PRN

## 2020-05-01 MED ORDER — SODIUM CHLORIDE 0.9 % IV BOLUS: 500.0000 mL | Freq: Once | INTRAVENOUS | Status: DC

## 2020-05-01 NOTE — ED Provider Notes (Signed)
[facility] EMERGENCY DEPARTMENT Provider Note   CSN: [id number] Arrival date & time: 05/01/20  1210     History No chief complaint on file.   Douglas Amison. is a 54 y.o. male.  HPI   Patient with significant medical history of left nephrectomy, CKD, diabetes, hypertension presents to the emergency department after being stung by multiple bees and feeling like he was going to pass out.  Patient explains while he was out mowing the lawn he was stung by 4 bees and then he started to feel lightheaded, felt like his vision started to blur, and he had to get off the tractor and lay down because he felt dizzy.  He denies syncopal episode, hitting his head, losing consciousness, chest pain, shortness of breath, paresthesias but does admit he felt nauseous.  He has no history of anaphylaxis to bees, is not on an EpiPen, denies throat or tongue swelling or difficulty breathing.  He denies alleviating or aggravating factors.  EMS provided him with 50 mg of Benadryl, 4 mg of Zofran and 1400 mL of normal saline.  Patient denies fever, chills, shortness of breath, chest pain, abdominal pain, dysuria, pedal edema.  Past Medical History:  Diagnosis Date  . Cancer ([place])    kidney (L)  . Chronic kidney disease 2017   left renal mass  . Diabetes mellitus without complication ([place])   . Hypertension     Patient Active Problem List   Diagnosis Date Noted  . Tear of left rotator cuff 07/04/2018  . Internal impingement of left shoulder 01/18/2018  . History of colonic polyps 05/18/2016  . Body mass index 35.0-35.9, adult 05/07/2016  . Acute bronchitis 01/28/2016  . 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 Gross hematuria 09/18/2015  . Biceps tendinitis of both shoulders 10/23/2014  . Right anterior knee pain 10/23/2014  . Contact dermatitis and other eczema 02/07/2014  . Type 2 or unspecified type diabetes mellitus, uncontrolled 09/18/2013  . Disorders of bursae and tendons in shoulder region,  unspecified 04/12/2013  . Left shoulder pain 04/12/2013  . Benign essential hypertension 04/06/2013  . Mixed hyperlipidemia 04/06/2013  . Routine general medical examination at a health care facility 04/06/2013  . Precordial pain 10/01/2011    Past Surgical History:  Procedure Laterality Date  . COLONOSCOPY  2012  . COLONOSCOPY N/A 09/03/2016   Procedure: COLONOSCOPY;  Surgeon: Rogene Houston, MD;  [facility];  Service: Endoscopy;  Laterality: N/A;  730  . KNEE ARTHROSCOPY  1998    right   . LAPAROSCOPIC NEPHRECTOMY Left 10/10/2015   Procedure: LAPAROSCOPIC RADICAL NEPHRECTOMY;  Surgeon: Raynelle Bring, MD;  Location: [facility];  Service: Urology;  Laterality: Left;  . SHOULDER SURGERY         Family History  Problem Relation Age of Onset  . Colon cancer Neg Hx     Social History   Tobacco Use  . Smoking status: Former Research scientist (life sciences)  . Smokeless tobacco: Former Network engineer Use Topics  . Alcohol use: No  . Drug use: No    Home Medications Prior to Admission medications   Medication Sig Start Date End Date Taking? Authorizing Provider  atorvastatin (LIPITOR) 20 MG tablet Take 20 mg by mouth daily.   Yes [provider]  metFORMIN (GLUCOPHAGE-XR) 500 MG 24 hr tablet Take 2,000 mg by mouth daily. 02/23/20  Yes [provider]  Jonetta Speak LANCETS 25D MISC  08/25/16  Yes [provider]  Roma Schanz  test strip  09/22/16  Yes [provider]  testosterone cypionate (DEPOTESTOSTERONE CYPIONATE) 200 MG/ML injection Inject 200 mg into the muscle every 14 (fourteen) days. 11/16/19  Yes [provider]  valsartan-hydrochlorothiazide (DIOVAN-HCT) 160-12.5 MG tablet Take 1 tablet by mouth daily. 10/28/19  Yes [provider]  EPINEPHrine (EPIPEN 2-PAK) 0.3 mg/0.3 mL IJ SOAJ injection Inject 0.3 mLs (0.3 mg total) into the muscle as needed for anaphylaxis. 05/01/20   Marcello Fennel, PA-C  famotidine (PEPCID) 20 MG  tablet Take 1 tablet (20 mg total) by mouth 2 (two) times daily for 14 days. 05/01/20 05/15/20  Marcello Fennel, PA-C  NEOMYCIN-POLYMYXIN-HYDROCORTISONE (CORTISPORIN) 1 % SOLN OTIC solution Apply 1-2 drops to toe BID after soaking Patient not taking: Reported on 05/01/2020 03/12/20   Garrel Ridgel, DPM    Allergies    Patient has no known allergies.  Review of Systems   Review of Systems  Constitutional: Positive for diaphoresis. Negative for chills and fever.  HENT: Negative for congestion, sore throat and tinnitus.   Eyes: Positive for visual disturbance.  Respiratory: Negative for cough and shortness of breath.   Cardiovascular: Negative for chest pain and palpitations.  Gastrointestinal: Negative for abdominal pain, diarrhea, nausea and vomiting.  Genitourinary: Negative for enuresis, flank pain and frequency.  Musculoskeletal: Negative for back pain.  Skin: Negative for rash.  Neurological: Positive for light-headedness and headaches. Negative for dizziness.  Hematological: Does not bruise/bleed easily.    Physical Exam Updated Vital Signs BP 116/63   Pulse 84   Temp 98.4 F (36.9 C) (Oral)   Resp (!) 24   Ht 5\' 7"  (1.702 m)   Wt 97.5 kg   SpO2 96%   BMI 33.67 kg/m   Physical Exam Vitals and nursing note reviewed.  Constitutional:      General: He is not in acute distress.    Appearance: Normal appearance. He is not ill-appearing or diaphoretic.  HENT:     Head: Normocephalic and atraumatic.     Nose: No congestion or rhinorrhea.     Mouth/Throat:     Mouth: Mucous membranes are moist.     Pharynx: Oropharynx is clear. No oropharyngeal exudate or posterior oropharyngeal erythema.     Comments: Pharynx was visualized, tongue uvula are both midline, patient was controlling nose crease out difficulty. Eyes:     General: No visual field deficit or scleral icterus.    Conjunctiva/sclera: Conjunctivae normal.     Pupils: Pupils are equal, round, and reactive to light.    Cardiovascular:     Rate and Rhythm: Normal rate and regular rhythm.     Pulses: Normal pulses.     Heart sounds: No murmur heard.  No friction rub. No gallop.   Pulmonary:     Effort: Pulmonary effort is normal. No respiratory distress.     Breath sounds: No wheezing, rhonchi or rales.  Abdominal:     General: There is no distension.     Palpations: Abdomen is soft.     Tenderness: There is no abdominal tenderness. There is no right CVA tenderness, left CVA tenderness or guarding.  Musculoskeletal:        General: No swelling or tenderness.  Skin:    General: Skin is warm and dry.     Capillary Refill: Capillary refill takes less than 2 seconds.     Findings: Lesion present. No rash.     Comments: Skin exam was performed, no diffuse rash noted, had a papule  on the medial aspect of the left lower leg, erythematous, nontender to palpation, not itchy, no induration or fluctuance felt.  Neurological:     General: No focal deficit present.     Mental Status: He is alert and oriented to person, place, and time.     GCS: GCS eye subscore is 4. GCS verbal subscore is 5. GCS motor subscore is 6.     Cranial Nerves: Cranial nerves are intact. No cranial nerve deficit or facial asymmetry.     Sensory: Sensation is intact. No sensory deficit.     Motor: Motor function is intact. No weakness or pronator drift.     Coordination: Coordination is intact. Romberg sign negative. Finger-Nose-Finger Test and Heel to [facility] Test normal.  Psychiatric:        Mood and Affect: Mood normal.     ED Results / Procedures / Treatments   Labs (all labs ordered are listed, but only abnormal results are displayed) Labs Reviewed  COMPREHENSIVE METABOLIC PANEL - Abnormal; Notable for the following components:      Result Value   Glucose, Bld 180 (*)    Creatinine, Ser 1.44 (*)    Alkaline Phosphatase 36 (*)    GFR calc non Af Amer 55 (*)    All other components within normal limits  CBC WITH  DIFFERENTIAL/PLATELET - Abnormal; Notable for the following components:   WBC 15.1 (*)    Neutro Abs 13.2 (*)    Lymphs Abs 0.6 (*)    All other components within normal limits  LACTIC ACID, PLASMA - Abnormal; Notable for the following components:   Lactic Acid, Venous 3.0 (*)    All other components within normal limits  LACTIC ACID, PLASMA - Abnormal; Notable for the following components:   Lactic Acid, Venous 2.3 (*)    All other components within normal limits  CK - Abnormal; Notable for the following components:   Total CK 541 (*)    All other components within normal limits  TROPONIN I (HIGH SENSITIVITY) - Abnormal; Notable for the following components:   Troponin I (High Sensitivity) 21 (*)    All other components within normal limits  TROPONIN I (HIGH SENSITIVITY) - Abnormal; Notable for the following components:   Troponin I (High Sensitivity) 41 (*)    All other components within normal limits  CULTURE, BLOOD (ROUTINE X 2)  CULTURE, BLOOD (ROUTINE X 2)  URINALYSIS, ROUTINE W REFLEX MICROSCOPIC    EKG EKG Interpretation  Date/Time:  Wednesday May 01 2020 12:36:47 EDT Ventricular Rate:  85 PR Interval:    QRS Duration: 106 QT Interval:  384 QTC Calculation: 457 R Axis:   18 Text Interpretation: Sinus rhythm Confirmed by Elnora Morrison [phone]) on 05/01/2020 3:45:04 PM   Radiology DG Chest Port 1 View  Result Date: 05/01/2020 CLINICAL DATA:  Syncope EXAM: PORTABLE CHEST 1 VIEW COMPARISON:  05/06/2016 FINDINGS: Heart and mediastinal contours are within normal limits. No focal opacities or effusions. No acute bony abnormality. IMPRESSION: No active disease. Electronically Signed   By: Rolm Baptise M.D.   On: 05/01/2020 15:22    Procedures Procedures (including critical care time)  Medications Ordered in ED Medications  sodium chloride 0.9 % bolus 500 mL (0 mLs Intravenous Stopped 05/01/20 1623)    ED Course  I have reviewed the triage vital signs and the  nursing notes.  Pertinent labs & imaging results that were available during my care of the patient were reviewed by me and considered in  my medical decision making (see chart for details).    MDM Rules/Calculators/A&P                          I have personally reviewed all imaging, labs and have interpreted them.  Patient presents near syncopal episode after being stung by 4 bees.  Patient was alert and oriented did not appear to be in acute distress, vital signs on exam was BP of 100/61, heart rate of low 90s no obvious signs of acute respiratory distress.  Oropharynx was visualized, no signs of impending airway compromise noted, lung sounds are clear bilaterally no wheezing or rhonchi heard, abdomen was nontender to palpation, no diffuse rash noted on exam, 2 small papules noted patient's left lower leg most likely from bee stings.  No pedal edema noted.  Will order labs, provide additional fluids.  14:00 initial labs reveal lactate of 3.0, leukocytosis of 15.1, no signs of anemia.  Most likely elevated lactate and leukocytosis is secondary to dehydration but cannot exclude infectious source.  Will order UA, blood cultures, chest x-ray for further evaluation.  CK slightly elevated at 541, troponin 21.  Low suspicion for rhabdo.  Will order second troponin  17:00 patient second troponin is 41, lactic acid has decreased from 3 down to 2.3, chest x-ray unremarkable, EKG sinus rhythm without signs of ischemia.  I have low suspicion for cardiac abnormality as patient is denying chest pain, shortness of breath, no signs of hypoperfusion or fluid overload noted on exam, most likely elevated troponin is secondary to dehydration as well as being tachycardic.  Low suspicion patient suffering from a systemic infection as patient was nontoxic-appearing, vital signs reassuring, leukocytosis most likely secondary to stress-induced unlikely due to infectious course.  Low suspicion for rhabdo as CK was 541,  creatinine was 1.4 which appears to be at baseline for patient.  Low suspicion for anaphylactic shock as no systemic rash was noted, no tongue or throat swelling noted.  Patient appears to be resting comfortably in bed show no acute signs stress.  Vital signs have remained stable does not meet criteria to be admitted to the hospital.  I suspect patient was dehydrated which caused him to come hypotensive, this would explain his lactate, CK, and troponin levels.  Cannot fully exclude anaphylactic shock will prescribe EpiPen and provide instruction how to use it.  Recommend follow-up with PCP for further evaluation.  Patient was discussed with attending who agrees assessment and plan.  Patient was given at home care as well as strict return precautions.  Patient verbalized that he understood and agreed to plan.     Final Clinical Impression(s) / ED Diagnoses Final diagnoses:  Hypotension, unspecified hypotension type  Bee sting, accidental or unintentional, initial encounter    Rx / DC Orders ED Discharge Orders         Ordered    famotidine (PEPCID) 20 MG tablet  2 times daily        05/01/20 1712    EPINEPHrine (EPIPEN 2-PAK) 0.3 mg/0.3 mL IJ SOAJ injection  As needed        05/01/20 1712           Marcello Fennel, PA-C 05/01/20 1742    Elnora Morrison, MD 05/03/20 1549

## 2020-05-01 NOTE — Discharge Instructions (Addendum)
You have been seen here for possible heat exhaustion.  Lab work and imaging are improving.  I want you continue stay hydrated please drink plenty of fluids.  I have prescribed you an EpiPen with instructions, please use if you feel your throat is swelling up, your tongue is swelling up, you have difficulty swallowing your own saliva, wheezing, shortness of breath, diffuse rash.  I prescribed you Pepcid as well as Claritin please take once a day for the next 2 weeks  I want you to follow-up with your primary care doctor or an allergist for reevaluation to determine if you are allergic to bees.  I want to come back to the emergency department if you use your EpiPen, have tongue or throat swelling, difficulty swallowing own saliva, diffuse rash, chest pain, shortness of breath, uncontrolled nausea, vomiting, diarrhea as you symptoms require further evaluation management.

## 2020-05-01 NOTE — ED Triage Notes (Signed)
EMS reports pt was mowing his yard and was stung by multiple yellow jackets.  Reports when they arrived pt was pale, diaphoretic, hr 104, bp 60/30.  Denies any hives or breathing difficulty.  EMS gave 50mg  benadryl and 4mg  zofran.  EMS placed 18g in R ac.  EMS has given 1428ml ns and bp 120/70.  CBG 229, o2 sat 99% on room air.

## 2020-05-06 LAB — CULTURE, BLOOD (ROUTINE X 2)
Culture: NO GROWTH
Culture: NO GROWTH
Special Requests: ADEQUATE
Special Requests: ADEQUATE

## 2020-08-07 ENCOUNTER — Encounter: Payer: Self-pay | Admitting: Allergy & Immunology

## 2020-08-07 ENCOUNTER — Ambulatory Visit (INDEPENDENT_AMBULATORY_CARE_PROVIDER_SITE_OTHER): Payer: 59 | Admitting: Allergy & Immunology

## 2020-08-07 ENCOUNTER — Other Ambulatory Visit: Payer: Self-pay

## 2020-08-07 VITALS — BP 120/86 | HR 74 | Temp 97.7°F | Resp 18 | Ht 67.0 in | Wt 220.6 lb

## 2020-08-07 DIAGNOSIS — T63481D Toxic effect of venom of other arthropod, accidental (unintentional), subsequent encounter: Secondary | ICD-10-CM

## 2020-08-07 NOTE — Progress Notes (Signed)
NEW PATIENT  Date of Service/Encounter:  08/07/20  Referring provider: Caryl Bis, MD   Assessment:   Insect sting allergy  Plan/Recommendations:   1. Insect sting allergy - We are going to get some blood work to look for stinging insect allergies. - We are also getting tryptase to look for mast cell disease, which is more prevalent with stinging insect allergies.  - We will call you in 1-2 weeks with the redistills of the testing. - EpiPen training provided. - Anaphylaxis Management Plan provided.   2. Return in about 6 months (around 02/05/2021).  Subjective:   Douglas Vento. is a 54 y.o. male presenting today for evaluation of  Chief Complaint  Patient presents with   Insect Bite    stings- reaction occurred 05/01/2020, vision problem, night sweats, N&V    Douglas Katt. 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  Douglas Ann. was referred by Caryl Bis, MD.     Douglas Robertson is a 54 y.o. male presenting for an evaluation of stinging insect allergy.   On October 234th or so,  he was out mowing his lawn on a riding mower. He was not wearing a shirt "trying to get some sun". He had a yellow jacket s ting him on the sinside of his l;eft leg. I thinks that the same yellow jacket stung him on the chest and the back. He immediately went inside and within minutes, he had blurry vision and sweating. He had one episode of emesis and trouble breathing. He doesn ot remember whenther it was tightness or wheezing, but it was difficulty breathing. He urinated on himself as well. He called EMS and is unsure of the course since that time. He was NOT given an EpiPen. However review of the note shows that he was prescribed an EpiPen but never picked it up.     Per the ED note, he was given IVF, Benadryl and Zofran. He had a lot of other labs as well that demonstrated elevated lactic acid as well as an elevated troponin. He was discharged and did fine after that. He has not been stung since the visit.   He has no SAR or asthma at all. He tolerates all of the major food allergens without adverse event. He has no history of eczema at all. He has no history of contact dermatitis.   Otherwise, there is no history of other atopic diseases, including asthma, food allergies, drug allergies, environmental allergies, eczema, urticaria or contact dermatitis. There is no significant infectious history. Vaccinations are up to date.    Past Medical History: Patient Active Problem  List   Diagnosis Date Noted   Tear of left rotator cuff 07/04/2018   Internal impingement of left shoulder 01/18/2018   History of colonic polyps 05/18/2016   Body mass index 35.0-35.9, adult 05/07/2016   Acute bronchitis 01/28/2016   Neoplasm of left kidney 10/10/2015   Gross hematuria 09/18/2015   Biceps tendinitis of both shoulders 10/23/2014   Right anterior knee pain 10/23/2014   Contact dermatitis and other eczema 02/07/2014   Type 2 or unspecified type diabetes mellitus, uncontrolled 09/18/2013    Disorders of bursae and tendons in shoulder region, unspecified 04/12/2013   Left shoulder pain 04/12/2013   Benign essential hypertension 04/06/2013   Mixed hyperlipidemia 04/06/2013   Routine general medical examination at a health care facility 04/06/2013   Precordial pain 10/01/2011    Medication List:  Allergies as of 08/07/2020   No Known Allergies     Medication List       Accurate as of August 07, 2020  2:05 PM. If you have any questions, ask your nurse or doctor.        STOP taking these medications   famotidine 20 MG tablet Commonly known as: PEPCID Stopped by: Valentina Shaggy, MD   NEOMYCIN-POLYMYXIN-HYDROCORTISONE 1 % Soln OTIC solution Commonly known as: CORTISPORIN Stopped by: Valentina Shaggy, MD     TAKE these medications   atorvastatin 20 MG tablet Commonly known as: LIPITOR Take 20 mg by mouth daily.   EPINEPHrine 0.3 mg/0.3 mL Soaj injection Commonly known as: EpiPen 2-Pak Inject 0.3 mLs (0.3 mg total) into the muscle as needed for anaphylaxis.   metFORMIN 500 MG 24 hr tablet Commonly known as: GLUCOPHAGE-XR Take 2,000 mg by mouth daily.   OneTouch Delica Lancets 70J Misc   OneTouch Verio test strip Generic drug: glucose blood   testosterone cypionate 200 MG/ML injection Commonly known as: DEPOTESTOSTERONE CYPIONATE Inject 200 mg into the muscle every 14 (fourteen) days.   valsartan-hydrochlorothiazide 160-12.5 MG tablet Commonly known as: DIOVAN-HCT Take 1 tablet by mouth daily.       Birth History: non-contributory  Developmental History: non-contributory  Past Surgical History: Past Surgical History:  Procedure Laterality Date   COLONOSCOPY  2012   COLONOSCOPY N/A 09/03/2016   Procedure: COLONOSCOPY;  Surgeon: Rogene Houston, MD;  [facility];  Service: Endoscopy;  Laterality: N/A;  [place]    right    LAPAROSCOPIC NEPHRECTOMY Left 10/10/2015   Procedure: LAPAROSCOPIC  RADICAL NEPHRECTOMY;  Surgeon: Raynelle Bring, MD;  Location: [facility];  Service: Urology;  Laterality: Left;   SHOULDER SURGERY       Family History: Family History  Problem Relation Age of Onset   Colon cancer Neg Hx      Social History: Douglas Robertson lives at home with his family.  He lives in a house that was built in 1994.  There is wood throughout the home.  They have electric heating and wood for heating as well.  They have a heat pump for cooling.  There is a dog inside the home.  There are dust mite covers on the bedding.  There is no tobacco exposure.  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  He is not a current smoker, but smoked over 20 years ago.  He is exposed to fumes, chemicals, and dust.  He does not have a HEPA filter in his home.  His wife works as  a nurse in Infection Prevention at [employer]   Review of Systems  Constitutional: Negative.  Negative for chills, fever, malaise/fatigue and weight loss.  HENT: Negative.  Negative for congestion, ear discharge, ear pain, sinus pain and sore throat.   Eyes: Negative for pain, discharge and redness.  Respiratory: Negative for cough, sputum production, shortness of breath and wheezing.   Cardiovascular: Negative.  Negative for chest pain and palpitations.  Gastrointestinal: Negative for abdominal pain, constipation, diarrhea, heartburn, nausea and vomiting.  Skin: Negative.  Negative for itching and rash.  Neurological: Negative for dizziness and headaches.  Endo/Heme/Allergies: Negative for environmental allergies. Does not bruise/bleed easily.       Objective:   Blood pressure 120/86, pulse 74, temperature 97.7 F (36.5 C), temperature source Temporal, resp. rate 18, height 5\' 7"  (1.702 m), weight 220 lb 9.6 oz (100.1 kg), SpO2 97 %. Body mass index is 34.55 kg/m.   Physical Exam:   Physical Exam Constitutional:      Appearance: He is well-developed.     Comments: Pleasant  male. Cooperative with the exam.   HENT:     Head: Normocephalic and atraumatic.     Right Ear: Tympanic membrane, ear canal and external ear normal. No drainage, swelling or tenderness. Tympanic membrane is not injected, scarred, erythematous, retracted or bulging.     Left Ear: Tympanic membrane, ear canal and external ear normal. No drainage, swelling or tenderness. Tympanic membrane is not injected, scarred, erythematous, retracted or bulging.     Nose: No nasal deformity, septal deviation, mucosal edema or rhinorrhea.     Right Sinus: No maxillary sinus tenderness or frontal sinus tenderness.     Left Sinus: No maxillary sinus tenderness or frontal sinus tenderness.     Mouth/Throat:     Mouth: Mucous membranes are not pale and not dry.     Pharynx: Uvula midline.  Eyes:     General:        Right eye: No discharge.        Left eye: No discharge.     Conjunctiva/sclera: Conjunctivae normal.     Right eye: Right conjunctiva is not injected. No chemosis.    Left eye: Left conjunctiva is not injected. No chemosis.    Pupils: Pupils are equal, round, and reactive to light.  Cardiovascular:     Rate and Rhythm: Normal rate and regular rhythm.     Heart sounds: Normal heart sounds.  Pulmonary:     Effort: Pulmonary effort is normal. No tachypnea, accessory muscle usage or respiratory distress.     Breath sounds: Normal breath sounds. No wheezing, rhonchi or rales.  Chest:     Chest wall: No tenderness.  Abdominal:     Tenderness: There is no abdominal tenderness. There is no guarding or rebound.  Lymphadenopathy:     Head:     Right side of head: No submandibular, tonsillar or occipital adenopathy.     Left side of head: No submandibular, tonsillar or occipital adenopathy.     Cervical: No cervical adenopathy.  Skin:    Coloration: Skin is not pale.     Findings: No abrasion, erythema, petechiae or rash. Rash is not papular, urticarial or vesicular.  Neurological:     Mental  Status: He is alert.      Diagnostic studies: labs sent instead       Salvatore Marvel, MD [facility] [place] [facility] [place]

## 2020-08-07 NOTE — Patient Instructions (Addendum)
1. Insect sting allergy - We are going to get some blood work to look for stinging insect allergies. - We are also getting tryptase to look for mast cell disease, which is more prevalent with stinging insect allergies.  - We will call you in 1-2 weeks with the redistills of the testing. - EpiPen training provided. - Anaphylaxis Management Plan provided.   2. Return in about 6 months (around 02/05/2021).    Please inform us of any Emergency Department visits, hospitalizations, or changes in symptoms. Call us before going to the ED for breathing or allergy symptoms since we might be able to fit you in for a sick visit. Feel free to contact us anytime with any questions, problems, or concerns.  It was a pleasure to meet you today!  Websites that have reliable patient information: 1. American Academy of Asthma, Allergy, and Immunology: www.aaaai.org 2. Food Allergy Research and Education (FARE): foodallergy.org 3. Mothers of Asthmatics: http://www.asthmacommunitynetwork.org 4. American College of Allergy, Asthma, and Immunology: www.acaai.org   COVID-19 Vaccine Information can be found at: ShippingScam.co.uk For questions related to vaccine distribution or appointments, please email [email or web address] or call [phone].     "Like" [place] on Facebook and Instagram for our latest updates!     HAPPY FALL!     Make sure you are registered to vote! If you have moved or changed any of your contact information, you will need to get this updated before voting!  In some cases, you MAY be able to register to vote online: [email or web address]

## 2020-08-09 LAB — ALLERGEN STINGING INSECT PANEL
Honeybee IgE: <0.1 kU/L
Hornet, White Face, IgE: 1.33 kU/L — AB
Hornet, Yellow, IgE: 0.58 kU/L — AB
Paper Wasp IgE: 2.02 kU/L — AB
Yellow Jacket, IgE: 1.01 kU/L — AB

## 2020-08-09 LAB — TRYPTASE: Tryptase: 8.7 ug/L (ref 2.2–13.2)

## 2020-08-13 ENCOUNTER — Telehealth: Payer: Self-pay | Admitting: Allergy & Immunology

## 2020-08-13 NOTE — Telephone Encounter (Signed)
Patient received a mychart message with allergy test results and has some questions regarding venom immunotherapy.  Please advise.

## 2020-08-14 NOTE — Telephone Encounter (Signed)
Patient called back regarding his test result questions.  Please advise.

## 2020-08-14 NOTE — Telephone Encounter (Signed)
Thank you for calling that patient.   Salvatore Marvel, MD [facility] [place]

## 2020-08-14 NOTE — Telephone Encounter (Signed)
Patient informed of venom immunotherapy protocol and risks. I also went over the emergency action plan with him. He is going to think about it and give me a call back.

## 2020-09-17 NOTE — Addendum Note (Signed)
Addended by: Valentina Shaggy on: 09/17/2020 06:40 PM   Modules accepted: Orders

## 2020-09-18 ENCOUNTER — Ambulatory Visit (INDEPENDENT_AMBULATORY_CARE_PROVIDER_SITE_OTHER): Payer: 59

## 2020-09-18 ENCOUNTER — Other Ambulatory Visit: Payer: Self-pay

## 2020-09-18 DIAGNOSIS — Z91038 Other insect allergy status: Secondary | ICD-10-CM

## 2020-09-18 NOTE — Progress Notes (Signed)
Immunotherapy   Patient Details  Name: Douglas Robertson. MRN: [id number] Date of Birth: 1966-08-14  09/18/2020  [name] here to start Venom injections for Mv and Wasp. Patient received 0.05 of MV 0.003 and Wasp 0.001. Patient waited 30 minutes with no problems.Patients schedule changes every week so he doesn't know what days he will be able to come. Patient will call the days he is able to come so we can mix down. Frequency: Weekly Epi-Pen: Yes Consent signed and patient instructions given.   Herbie Drape 09/18/2020, 10:08 AM

## 2020-09-25 ENCOUNTER — Ambulatory Visit (INDEPENDENT_AMBULATORY_CARE_PROVIDER_SITE_OTHER): Payer: 59

## 2020-09-25 ENCOUNTER — Other Ambulatory Visit: Payer: Self-pay

## 2020-09-25 DIAGNOSIS — Z91038 Other insect allergy status: Secondary | ICD-10-CM

## 2020-10-02 ENCOUNTER — Ambulatory Visit (INDEPENDENT_AMBULATORY_CARE_PROVIDER_SITE_OTHER): Payer: 59

## 2020-10-02 ENCOUNTER — Other Ambulatory Visit: Payer: Self-pay

## 2020-10-02 DIAGNOSIS — Z91038 Other insect allergy status: Secondary | ICD-10-CM

## 2020-10-07 ENCOUNTER — Telehealth: Payer: Self-pay | Admitting: Allergy & Immunology

## 2020-10-07 ENCOUNTER — Other Ambulatory Visit: Payer: Self-pay

## 2020-10-07 MED ORDER — EPINEPHRINE 0.3 MG/0.3ML IJ SOAJ: 0.3000 mg | Freq: Once | INTRAMUSCULAR | 1 refills | Status: AC

## 2020-10-07 NOTE — Telephone Encounter (Signed)
Patient states Epi Pen was never called in to the [facility] on [address]  Please advise.

## 2020-10-07 NOTE — Telephone Encounter (Signed)
Epi pen sent to pharmacist

## 2020-10-09 ENCOUNTER — Ambulatory Visit (INDEPENDENT_AMBULATORY_CARE_PROVIDER_SITE_OTHER): Payer: 59

## 2020-10-09 DIAGNOSIS — Z91038 Other insect allergy status: Secondary | ICD-10-CM

## 2020-10-09 MED ORDER — EPINEPHRINE 0.3 MG/0.3ML IJ SOAJ: 0.3000 mg | INTRAMUSCULAR | 2 refills | Status: DC | PRN

## 2020-10-16 ENCOUNTER — Ambulatory Visit (INDEPENDENT_AMBULATORY_CARE_PROVIDER_SITE_OTHER): Payer: 59

## 2020-10-16 ENCOUNTER — Other Ambulatory Visit: Payer: Self-pay

## 2020-10-16 DIAGNOSIS — Z91038 Other insect allergy status: Secondary | ICD-10-CM

## 2020-10-23 ENCOUNTER — Other Ambulatory Visit: Payer: Self-pay

## 2020-10-23 ENCOUNTER — Ambulatory Visit (INDEPENDENT_AMBULATORY_CARE_PROVIDER_SITE_OTHER): Payer: 59

## 2020-10-23 DIAGNOSIS — Z91038 Other insect allergy status: Secondary | ICD-10-CM

## 2020-10-30 ENCOUNTER — Ambulatory Visit: Payer: Self-pay

## 2020-11-06 ENCOUNTER — Other Ambulatory Visit: Payer: Self-pay

## 2020-11-06 ENCOUNTER — Ambulatory Visit (INDEPENDENT_AMBULATORY_CARE_PROVIDER_SITE_OTHER): Payer: 59

## 2020-11-06 DIAGNOSIS — Z91038 Other insect allergy status: Secondary | ICD-10-CM

## 2020-11-13 ENCOUNTER — Other Ambulatory Visit: Payer: Self-pay

## 2020-11-13 ENCOUNTER — Ambulatory Visit (INDEPENDENT_AMBULATORY_CARE_PROVIDER_SITE_OTHER): Payer: 59

## 2020-11-13 DIAGNOSIS — Z91038 Other insect allergy status: Secondary | ICD-10-CM

## 2020-11-13 DIAGNOSIS — J309 Allergic rhinitis, unspecified: Secondary | ICD-10-CM

## 2020-11-20 ENCOUNTER — Ambulatory Visit (INDEPENDENT_AMBULATORY_CARE_PROVIDER_SITE_OTHER): Payer: 59 | Admitting: *Deleted

## 2020-11-20 ENCOUNTER — Other Ambulatory Visit: Payer: Self-pay

## 2020-11-20 DIAGNOSIS — Z91038 Other insect allergy status: Secondary | ICD-10-CM

## 2020-11-27 ENCOUNTER — Other Ambulatory Visit: Payer: Self-pay

## 2020-11-27 ENCOUNTER — Ambulatory Visit (INDEPENDENT_AMBULATORY_CARE_PROVIDER_SITE_OTHER): Payer: 59

## 2020-11-27 DIAGNOSIS — Z91038 Other insect allergy status: Secondary | ICD-10-CM

## 2020-12-04 ENCOUNTER — Ambulatory Visit (INDEPENDENT_AMBULATORY_CARE_PROVIDER_SITE_OTHER): Payer: 59

## 2020-12-04 ENCOUNTER — Other Ambulatory Visit: Payer: Self-pay

## 2020-12-04 DIAGNOSIS — Z91038 Other insect allergy status: Secondary | ICD-10-CM | POA: Diagnosis not present

## 2020-12-11 ENCOUNTER — Other Ambulatory Visit: Payer: Self-pay

## 2020-12-11 ENCOUNTER — Ambulatory Visit (INDEPENDENT_AMBULATORY_CARE_PROVIDER_SITE_OTHER): Payer: 59

## 2020-12-11 DIAGNOSIS — Z91038 Other insect allergy status: Secondary | ICD-10-CM

## 2020-12-18 ENCOUNTER — Ambulatory Visit (INDEPENDENT_AMBULATORY_CARE_PROVIDER_SITE_OTHER): Payer: 59

## 2020-12-18 ENCOUNTER — Ambulatory Visit: Payer: Self-pay

## 2020-12-18 DIAGNOSIS — Z91038 Other insect allergy status: Secondary | ICD-10-CM

## 2020-12-25 ENCOUNTER — Other Ambulatory Visit: Payer: Self-pay

## 2020-12-25 ENCOUNTER — Ambulatory Visit (INDEPENDENT_AMBULATORY_CARE_PROVIDER_SITE_OTHER): Payer: 59 | Admitting: *Deleted

## 2020-12-25 DIAGNOSIS — Z91038 Other insect allergy status: Secondary | ICD-10-CM | POA: Diagnosis not present

## 2021-01-01 ENCOUNTER — Ambulatory Visit: Payer: Self-pay

## 2021-01-08 ENCOUNTER — Ambulatory Visit (INDEPENDENT_AMBULATORY_CARE_PROVIDER_SITE_OTHER): Payer: 59

## 2021-01-08 ENCOUNTER — Other Ambulatory Visit: Payer: Self-pay

## 2021-01-08 DIAGNOSIS — Z91038 Other insect allergy status: Secondary | ICD-10-CM

## 2021-01-15 ENCOUNTER — Ambulatory Visit (INDEPENDENT_AMBULATORY_CARE_PROVIDER_SITE_OTHER): Payer: 59

## 2021-01-15 ENCOUNTER — Other Ambulatory Visit: Payer: Self-pay

## 2021-01-15 DIAGNOSIS — Z91038 Other insect allergy status: Secondary | ICD-10-CM | POA: Diagnosis not present

## 2021-01-21 ENCOUNTER — Ambulatory Visit: Payer: 59 | Admitting: Podiatry

## 2021-01-21 ENCOUNTER — Other Ambulatory Visit: Payer: Self-pay

## 2021-01-21 ENCOUNTER — Encounter: Payer: Self-pay | Admitting: Podiatry

## 2021-01-21 ENCOUNTER — Ambulatory Visit (INDEPENDENT_AMBULATORY_CARE_PROVIDER_SITE_OTHER): Payer: 59

## 2021-01-21 DIAGNOSIS — M79672 Pain in left foot: Secondary | ICD-10-CM

## 2021-01-21 DIAGNOSIS — M778 Other enthesopathies, not elsewhere classified: Secondary | ICD-10-CM | POA: Diagnosis not present

## 2021-01-21 DIAGNOSIS — R2241 Localized swelling, mass and lump, right lower limb: Secondary | ICD-10-CM | POA: Diagnosis not present

## 2021-01-21 DIAGNOSIS — M79671 Pain in right foot: Secondary | ICD-10-CM

## 2021-01-21 MED ORDER — DICLOFENAC SODIUM 1 % EX GEL: 2.0000 g | Freq: Four times a day (QID) | CUTANEOUS | 0 refills | Status: DC

## 2021-01-21 MED ORDER — TRIAMCINOLONE ACETONIDE 10 MG/ML IJ SUSP: 10.0000 mg | Freq: Once | INTRAMUSCULAR | Status: DC

## 2021-01-21 MED ORDER — MELOXICAM 15 MG PO TABS: 15.0000 mg | ORAL_TABLET | Freq: Every day | ORAL | 0 refills | Status: DC

## 2021-01-22 ENCOUNTER — Ambulatory Visit: Payer: Self-pay

## 2021-01-24 ENCOUNTER — Ambulatory Visit (INDEPENDENT_AMBULATORY_CARE_PROVIDER_SITE_OTHER): Payer: 59

## 2021-01-24 DIAGNOSIS — Z91038 Other insect allergy status: Secondary | ICD-10-CM

## 2021-01-26 NOTE — Progress Notes (Signed)
Subjective: 55 year old male presents the office today for concerns of right foot pain.  He states the entire foot is tender for the last 2 weeks.  Denies any specific injury or trauma he reports.  He has attempted hurts to walk.  He has pain along the arch area as well as the top of the foot.  He has noticed some mild swelling to the top of the foot.  No recent treatment.  He is diabetic his last A1c was 6.4 last morning blood sugar was 100.   Denies any systemic complaints such as fevers, chills, nausea, vomiting. No acute changes since last appointment, and no other complaints at this time.   Objective: AAO x3, NAD DP/PT pulses palpable bilaterally, CRT less than 3 seconds There is mild edema to the dorsal aspect the right foot compared to the left side.  There is no specific area of pinpoint tenderness to my palpation is difficult to elicit any area of tenderness has majority of tenderness of the knee but fracture of the foot he is on his feet a lot.  On exam it seems that there is tenderness along the second interspace as well as the dorsal midfoot.  No significant pain on the insertion of plantar fascia into the calcaneus or the arch of the foot.  MMT 5/5.  No pain with calf compression, swelling, warmth, erythema  Assessment: Right foot pain, capsulitis  Plan: -All treatment options discussed with the patient including all alternatives, risks, complications.  -X-rays obtained reviewed.  No evidence of acute fracture identified. -Steroid injection performed the dorsal midfoot.  Skin was prepped with alcohol and mixture of 1 cc Kenalog 10, 1 cc lidocaine plain was infiltrated into the area of tenderness on exam today which is the dorsal midfoot.  He tolerated procedure well.  Post procedure instructions discussed. -He has inserts that are couple years old.  Discussed with him I like to evaluate that and we may need to at least change the top-cover -Prescribed mobic. Discussed side effects of the  medication and directed to stop if any are to occur and call the office.  -Patient encouraged to call the office with any questions, concerns, change in symptoms.   Trula Slade DPM

## 2021-01-29 ENCOUNTER — Ambulatory Visit: Payer: Self-pay

## 2021-02-05 ENCOUNTER — Other Ambulatory Visit: Payer: Self-pay

## 2021-02-05 ENCOUNTER — Ambulatory Visit (INDEPENDENT_AMBULATORY_CARE_PROVIDER_SITE_OTHER): Payer: 59 | Admitting: Allergy & Immunology

## 2021-02-05 ENCOUNTER — Ambulatory Visit: Payer: 59 | Admitting: Family

## 2021-02-05 ENCOUNTER — Ambulatory Visit (INDEPENDENT_AMBULATORY_CARE_PROVIDER_SITE_OTHER): Payer: 59

## 2021-02-05 ENCOUNTER — Encounter: Payer: Self-pay | Admitting: Allergy & Immunology

## 2021-02-05 VITALS — BP 118/68 | HR 69 | Temp 98.0°F | Resp 18 | Ht 67.0 in | Wt 211.0 lb

## 2021-02-05 DIAGNOSIS — Z91038 Other insect allergy status: Secondary | ICD-10-CM | POA: Diagnosis not present

## 2021-02-05 NOTE — Progress Notes (Signed)
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 Insect sting allergy (hornets, wasp, yellow jackets) - Continue with venom immunotherapy.  - EpiPen is up to date. - Once you get to maintenance, it will eventually space out to every 8 weeks.   2. Return in about 1 year (around 02/05/2022).     Subjective:   Douglas Alvidrez. is a 55 y.o. male presenting today for follow up of  Chief Complaint  Patient presents with  . Insect sting allergy    Douglas Ann. has a history of the following: Patient Active Problem List   Diagnosis Date Noted  . Tear of left rotator cuff 07/04/2018  . Internal impingement of left shoulder 01/18/2018  . History of colonic polyps 05/18/2016  . Body mass index 35.0-35.9, adult 05/07/2016  . Acute bronchitis 01/28/2016  . 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 Gross hematuria 09/18/2015  . Biceps tendinitis of both shoulders 10/23/2014  . Right anterior knee pain 10/23/2014  . Contact dermatitis and other eczema 02/07/2014  . Type 2 or unspecified type diabetes mellitus, uncontrolled 09/18/2013  . 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 Left shoulder pain 04/12/2013  . Benign essential hypertension 04/06/2013  . Mixed hyperlipidemia 04/06/2013  . Routine general medical examination at a health care facility 04/06/2013  . Precordial pain 10/01/2011    History obtained from: chart review and patient.  Douglas Robertson is a 55 y.o. male presenting for a follow up visit.  He was last seen in December 2021.  At that time, we obtain blood work to look for stinging insect allergies as well as a serum tryptase.  His lab ended up showing abnormal tryptase, but sensitization to hornet, yellow jacket, and wasp.  He started mixed vespid as well as wasp venom immunotherapy and has been advancing on that without a problem.  Since  the last visit, he has done well. He has avoided all stinging insects. EpiPen is up to date. He denies additional stings. He has not been having reactions to the shots at all.  He has not been able to get here on a regular basis for his shots, so he has not made as much progress as he would have liked.  He is going to try to make a concerted effort to be more consistent.  Is otherwise doing well.  He is working too much.  He tells me he is working 12-hour shifts 6 to 7 days/week.  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  He was able to take a trip to [facility] over the weekend with his wife and oldest daughter.  That is about the extent of his vacationing.  He has a history of kidney cancer.  He only has 1 kidney working.  However, he was discharged after 5 years being cancer free.  This was relatively recent.  Otherwise, there have been no changes to his past medical history, surgical history, family history, or social history.    Review of Systems  Constitutional: Negative.  Negative for fever, malaise/fatigue and weight loss.  HENT: Negative.  Negative for congestion, ear discharge and ear pain.   Eyes: Negative for pain, discharge and redness.  Respiratory: Negative for cough, sputum production, shortness of breath and wheezing.   Cardiovascular: Negative.  Negative for chest pain and palpitations.  Gastrointestinal: Negative for abdominal pain and heartburn.  Skin: Negative.  Negative for itching and rash.  Neurological: Negative for dizziness and headaches.  Endo/Heme/Allergies: Negative for environmental allergies. Does not bruise/bleed easily.       Objective:   Blood pressure 118/68, pulse 69, temperature 98 F (36.7 C), temperature source Temporal, resp. rate 18, height 5\' 7"  (1.702 m), weight 211 lb (95.7 kg), SpO2 95 %. Body mass index is 33.05 kg/m.   Physical Exam:  Physical Exam Constitutional:      Appearance: He is well-developed.      Comments: Pleasant personable male.  HENT:     Head: Normocephalic and atraumatic.     Right Ear: Tympanic membrane, ear canal and external ear normal.     Left Ear: Tympanic membrane, ear canal and external ear normal.     Nose: No nasal deformity, septal deviation, mucosal edema or rhinorrhea.     Right Turbinates: Enlarged and swollen.     Left Turbinates: Enlarged and swollen.     Right Sinus: No maxillary sinus tenderness or frontal sinus tenderness.     Left Sinus: No maxillary sinus tenderness or frontal sinus tenderness.     Mouth/Throat:     Mouth: Mucous membranes are not pale and not dry.     Pharynx: Uvula midline.  Eyes:     General:        Right eye: No discharge.        Left eye: No discharge.     Conjunctiva/sclera: Conjunctivae normal.     Right eye: Right conjunctiva is not injected. No chemosis.    Left eye: Left conjunctiva is not injected. No chemosis.    Pupils: Pupils are equal, round, and reactive to light.  Cardiovascular:     Rate and Rhythm: Normal rate and regular rhythm.     Heart sounds: Normal heart sounds.  Pulmonary:     Effort: Pulmonary effort is normal. No tachypnea, accessory muscle usage or respiratory distress.     Breath sounds: Normal breath sounds. No wheezing, rhonchi or rales.     Comments: Moving air well in all lung fields.  No increased work of breathing. Chest:     Chest wall: No tenderness.  Lymphadenopathy:     Cervical: No cervical adenopathy.  Skin:    Coloration: Skin is not pale.     Findings: No abrasion, erythema, petechiae or rash. Rash is not papular, urticarial or vesicular.  Neurological:     Mental Status: He is alert.  Psychiatric:        Behavior: Behavior is cooperative.      Diagnostic studies: none       Salvatore Marvel, MD  [facility]

## 2021-02-05 NOTE — Patient Instructions (Addendum)
1. Insect sting allergy (hornets, wasp, yellow jackets) - Continue with venom immunotherapy.  - EpiPen is up to date. - Once you get to maintenance, it will eventually space out to every 8 weeks.   2. Return in about 1 year (around 02/05/2022).    Please inform us of any Emergency Department visits, hospitalizations, or changes in symptoms. Call us before going to the ED for breathing or allergy symptoms since we might be able to fit you in for a sick visit. Feel free to contact us anytime with any questions, problems, or concerns.  It was a pleasure to see you again today!  Websites that have reliable patient information: 1. American Academy of Asthma, Allergy, and Immunology: www.aaaai.org 2. Food Allergy Research and Education (FARE): foodallergy.org 3. Mothers of Asthmatics: http://www.asthmacommunitynetwork.org 4. American College of Allergy, Asthma, and Immunology: www.acaai.org   COVID-19 Vaccine Information can be found at: ShippingScam.co.uk For questions related to vaccine distribution or appointments, please email [email or web address] or call [phone].   We realize that you might be concerned about having an allergic reaction to the COVID19 vaccines. To help with that concern, WE ARE OFFERING THE COVID19 VACCINES IN OUR OFFICE! Ask the front desk for dates!     "Like" [place] on Facebook and Instagram for our latest updates!      A healthy democracy works best when [employer] participate! Make sure you are registered to vote! If you have moved or changed any of your contact information, you will need to get this updated before voting!  In some cases, you MAY be able to register to vote online: [email or web address]

## 2021-02-10 ENCOUNTER — Emergency Department (HOSPITAL_COMMUNITY)
Admission: EM | Admit: 2021-02-10 | Discharge: 2021-02-10 | Disposition: A | Discharge status: Home / Self Care | Payer: 59 | Attending: Emergency Medicine | Admitting: Emergency Medicine

## 2021-02-10 ENCOUNTER — Encounter (HOSPITAL_COMMUNITY): Payer: Self-pay | Admitting: Emergency Medicine

## 2021-02-10 ENCOUNTER — Other Ambulatory Visit: Payer: Self-pay

## 2021-02-10 DIAGNOSIS — S00262A Insect bite (nonvenomous) of left eyelid and periocular area, initial encounter: Secondary | ICD-10-CM | POA: Diagnosis present

## 2021-02-10 DIAGNOSIS — S00461A Insect bite (nonvenomous) of right ear, initial encounter: Secondary | ICD-10-CM | POA: Insufficient documentation

## 2021-02-10 DIAGNOSIS — Z79899 Other long term (current) drug therapy: Secondary | ICD-10-CM | POA: Insufficient documentation

## 2021-02-10 DIAGNOSIS — T7840XA Allergy, unspecified, initial encounter: Secondary | ICD-10-CM | POA: Diagnosis not present

## 2021-02-10 DIAGNOSIS — N189 Chronic kidney disease, unspecified: Secondary | ICD-10-CM | POA: Diagnosis not present

## 2021-02-10 DIAGNOSIS — W57XXXA Bitten or stung by nonvenomous insect and other nonvenomous arthropods, initial encounter: Secondary | ICD-10-CM | POA: Diagnosis not present

## 2021-02-10 DIAGNOSIS — I129 Hypertensive chronic kidney disease with stage 1 through stage 4 chronic kidney disease, or unspecified chronic kidney disease: Secondary | ICD-10-CM | POA: Diagnosis not present

## 2021-02-10 DIAGNOSIS — Z7984 Long term (current) use of oral hypoglycemic drugs: Secondary | ICD-10-CM | POA: Diagnosis not present

## 2021-02-10 DIAGNOSIS — Z87891 Personal history of nicotine dependence: Secondary | ICD-10-CM | POA: Insufficient documentation

## 2021-02-10 DIAGNOSIS — Z85528 Personal history of other malignant neoplasm of kidney: Secondary | ICD-10-CM | POA: Insufficient documentation

## 2021-02-10 DIAGNOSIS — E119 Type 2 diabetes mellitus without complications: Secondary | ICD-10-CM | POA: Insufficient documentation

## 2021-02-10 MED ORDER — FAMOTIDINE IN NACL 20-0.9 MG/50ML-% IV SOLN
20.0000 mg | Freq: Once | INTRAVENOUS | Status: AC
  Administered 2021-02-10: 20 mg via INTRAVENOUS
  Filled 2021-02-10: qty 50

## 2021-02-10 MED ORDER — ONDANSETRON HCL 4 MG/2ML IJ SOLN
4.0000 mg | Freq: Once | INTRAMUSCULAR | Status: AC
  Administered 2021-02-10: 4 mg via INTRAVENOUS
  Filled 2021-02-10: qty 2

## 2021-02-10 MED ORDER — PREDNISONE 50 MG PO TABS: 50.0000 mg | ORAL_TABLET | Freq: Every day | ORAL | 0 refills | Status: AC

## 2021-02-10 MED ORDER — EPINEPHRINE 0.3 MG/0.3ML IJ SOAJ: 0.3000 mg | INTRAMUSCULAR | 0 refills | Status: DC | PRN

## 2021-02-10 NOTE — ED Notes (Signed)
Pt in hallway 2, pt states that he was stung by some bees, states that he tried to use his epi pen, but after he pulled the blue part off he couldn't read the directions for further use, pt denies throat or tongue swelling at this time, no angio edema noted, resps even and unlabored, pt talking in full sentences.

## 2021-02-10 NOTE — ED Provider Notes (Signed)
[facility] EMERGENCY DEPARTMENT Provider Note   CSN: [id number] Arrival date & time: 02/10/21  1050     History Chief Complaint  Patient presents with  . Allergic Reaction    Kahleb Mcclane. is a 55 y.o. male who presents via EMS for allergic reaction to multiple bee stings.  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  States he was weed whacking near the woods when he was stung approximately 6 times in the upper extremities and on the face and ears.  States that he tried to take his EpiPen, however was unable to read the directions so he did not use it.  EMS was called by his wife who administered 125 Solu-Medrol and 50 mg of IV Benadryl in route to the emergency department.  According to EMS, patient was nauseous and vomiting in the ambulance with localized urticaria around the stings.  He was not administered EpiPen, as he has not endorsed any throat tightening or difficulty swallowing or breathing..  Time of arrival to the emergency department, patient appears sleepy, however respirations are even and unlabored and patient is normal saturation, on 2 L for comfort.  Trialed off oxygen with maintenance of his O2 stats.  Patient states that he has been undergoing the venom injections with his allergist, and is disappointed that his reaction today does not seem improved from prior to beginning this therapy.  I personally reviewed this patient's medical records.  He has history of diabetes on metformin valsartan HCTZ, and hyper lipidemia.   HPI     Past Medical History:  Diagnosis Date  . Cancer ([place])    kidney (L)  . Chronic kidney disease 2017   left renal mass  . Diabetes mellitus without complication ([place])   . Hypertension     Patient Active Problem List   Diagnosis Date Noted  . Tear of left rotator cuff 07/04/2018  . Internal impingement of left shoulder 01/18/2018  . History of colonic polyps 05/18/2016  . Body mass index 35.0-35.9, adult 05/07/2016  . Acute  bronchitis 01/28/2016  . 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 Gross hematuria 09/18/2015  . Biceps tendinitis of both shoulders 10/23/2014  . Right anterior knee pain 10/23/2014  . Contact dermatitis and other eczema 02/07/2014  . Type 2 or unspecified type diabetes mellitus, uncontrolled 09/18/2013  . 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 Left shoulder pain 04/12/2013  . Benign essential hypertension 04/06/2013  . Mixed hyperlipidemia 04/06/2013  . Routine general medical examination at a health care facility 04/06/2013  . Precordial pain 10/01/2011    Past Surgical History:  Procedure Laterality Date  . COLONOSCOPY  2012  . COLONOSCOPY N/A 09/03/2016   Procedure: COLONOSCOPY;  Surgeon: Rogene Houston, MD;  Location: [facility] ENDO SUITE;  Service: Endoscopy;  Laterality: N/A;  730  . KNEE ARTHROSCOPY  1998    right   . LAPAROSCOPIC NEPHRECTOMY Left 10/10/2015   Procedure: LAPAROSCOPIC RADICAL NEPHRECTOMY;  Surgeon: Raynelle Bring, MD;  Location: [facility] ORS;  Service: Urology;  Laterality: Left;  . SHOULDER SURGERY      Family History  Problem Relation Age of Onset  . Colon cancer Neg Hx     Social History   Tobacco Use  . Smoking status: Former Research scientist (life sciences)  . Smokeless tobacco: Former Network engineer  . Vaping Use: Never used  Substance Use Topics  . Alcohol use: No  . Drug use: No    Home  Medications Prior to Admission medications   Medication Sig Start Date End Date Taking? Authorizing Provider  EPINEPHrine 0.3 mg/0.3 mL IJ SOAJ injection Inject 0.3 mg into the muscle as needed for anaphylaxis. 02/10/21  Yes Dacian Orrico, Gypsy Balsam, PA-C  predniSONE (DELTASONE) 50 MG tablet Take 1 tablet (50 mg total) by mouth daily with breakfast for 5 days. 02/10/21 02/15/21 Yes Voncille Simm, Eugene Garnet R, PA-C  atorvastatin (LIPITOR) 20 MG tablet Take 20 mg by mouth daily.    [provider]  diclofenac Sodium (VOLTAREN) 1 % GEL Apply 2 g topically 4 (four)  times daily. Rub into affected area of foot 2 to 4 times daily 01/21/21   Trula Slade, DPM  EPINEPHrine (EPIPEN 2-PAK) 0.3 mg/0.3 mL IJ SOAJ injection Inject 0.3 mg into the muscle as needed for anaphylaxis. 10/09/20   Valentina Shaggy, MD  meloxicam (MOBIC) 15 MG tablet Take 1 tablet (15 mg total) by mouth daily. 01/21/21 01/21/22  Trula Slade, DPM  metFORMIN (GLUCOPHAGE-XR) 500 MG 24 hr tablet Take 2,000 mg by mouth daily. 02/23/20   [provider]  ONETOUCH DELICA LANCETS 94W MISC  08/25/16   [provider]  [facility] VERIO test strip  09/22/16   [provider]  testosterone cypionate (DEPOTESTOSTERONE CYPIONATE) 200 MG/ML injection Inject 200 mg into the muscle every 14 (fourteen) days. 11/16/19   [provider]  valsartan-hydrochlorothiazide (DIOVAN-HCT) 160-12.5 MG tablet Take 1 tablet by mouth daily. 10/28/19   [provider]    Allergies    Bee venom  Review of Systems   Review of Systems  Reason unable to perform ROS: Allergic reaction.  HENT: Positive for ear pain.        Ear pain where stung by bee  Respiratory: Negative for apnea, cough, choking, shortness of breath, wheezing and stridor.   Cardiovascular: Negative for chest pain, palpitations and leg swelling.  Gastrointestinal: Positive for nausea and vomiting.  Skin: Positive for rash.       Hives locally around stings  Neurological: Positive for headaches. Negative for dizziness and light-headedness.    Physical Exam Updated Vital Signs BP 120/78 (BP Location: Left Arm)   Pulse 62   Temp (!) 97.5 F (36.4 C) (Oral)   Resp 18   Ht 5\' 7"  (1.702 m)   Wt 95.3 kg   SpO2 98%   BMI 32.89 kg/m   Physical Exam Vitals and nursing note reviewed.  Constitutional:      General: He is awake.     Appearance: He is overweight. He is ill-appearing. He is not toxic-appearing.     Interventions: Nasal cannula in place.  HENT:     Head: Normocephalic and atraumatic.      Nose: Nose normal.     Mouth/Throat:     Mouth: Mucous membranes are moist.     Pharynx: Oropharynx is clear. Uvula midline. No oropharyngeal exudate or posterior oropharyngeal erythema.     Tonsils: No tonsillar exudate.     Comments: No sublingual or submental tenderness to palpation.  No lingual edema, uvular deviation, or tracheal deviation.  No anterior neck swelling or crepitus. Eyes:     General: Lids are normal. Vision grossly intact.        Right eye: No discharge.        Left eye: No discharge.     Extraocular Movements: Extraocular movements intact.     Conjunctiva/sclera: Conjunctivae normal.     Pupils: Pupils are equal, round, and reactive to  light.  Neck:     Trachea: Trachea and phonation normal.  Cardiovascular:     Rate and Rhythm: Normal rate and regular rhythm.     Pulses: Normal pulses.     Heart sounds: Normal heart sounds. No murmur heard.   Pulmonary:     Effort: Pulmonary effort is normal. No tachypnea, bradypnea, accessory muscle usage, prolonged expiration or respiratory distress.     Breath sounds: Normal breath sounds. No wheezing or rales.     Comments: Respirations are even and unlabored.  Patient trialed off of nasal cannula with maintenance of his oxygen saturation greater than 95% on room air Chest:     Chest wall: No mass, lacerations, deformity, swelling, tenderness, crepitus or edema.  Abdominal:     General: Bowel sounds are normal. There is no distension.     Palpations: Abdomen is soft.     Tenderness: There is no abdominal tenderness. There is no right CVA tenderness, left CVA tenderness, guarding or rebound.  Musculoskeletal:        General: No deformity.     Cervical back: Normal range of motion and neck supple.     Right lower leg: No edema.     Left lower leg: No edema.  Lymphadenopathy:     Cervical: No cervical adenopathy.  Skin:    General: Skin is warm and dry.     Capillary Refill: Capillary refill takes less than 2  seconds.     Findings: No rash.       Neurological:     Mental Status: He is alert. Mental status is at baseline.  Psychiatric:        Mood and Affect: Mood normal.     ED Results / Procedures / Treatments   Labs (all labs ordered are listed, but only abnormal results are displayed) Labs Reviewed - No data to display  EKG None  Radiology No results found.  Procedures Procedures  Medications Ordered in ED Medications  famotidine (PEPCID) IVPB 20 mg premix (0 mg Intravenous Stopped 02/10/21 1252)  ondansetron (ZOFRAN) injection 4 mg (4 mg Intravenous Given 02/10/21 1219)    ED Course  I have reviewed the triage vital signs and the nursing notes.  Pertinent labs & imaging results that were available during my care of the patient were reviewed by me and considered in my medical decision making (see chart for details).    MDM Rules/Calculators/A&P                         51 old male with history of anaphylaxis to bee stings presents emergency department after multiple bee stings today with concern for allergic reaction.  Administered Solu-Medrol and Benadryl IV by EMS.  Concern for acute allergic reaction versus angioedema or anaphylaxis.  Vital signs are normal on intake.  Cardiopulmonary exam is normal, abdominal exam is benign.  Patient without urticaria, there is edema, tenderness palpation, and erythema in localized to each bee sting site without crepitus.  No urticarial or other rash noted on the face, trunk, or extremities.  EGD exam is very reassuring.  There is no sign of angioedema or oropharyngeal edema, no tracheal deviation, no restriction in the chest.  Will administer famotidine and reevaluate, patient does not currently meet criteria for anaphylaxis, will hold off on epinephrine at this time.  Patient continued to be reevaluated with progressive improvement in his symptoms as well as his blood pressure.  SBP at this time with  systolic in the 638V, heart rates  remain normal and oxygen saturation is normal.  Patient was ambulated around the emergency department by this provider with maintenance of his oxygen saturation on room air, and without significant tachycardia.  Heart rate remained below 90 throughout ambulation.  Patient feels much improved and is expressing wishes to be discharged home at this time.  No further work-up warranted in the ED as time.  Patient is a type II diabetic, however given history of anaphylaxis and acute allergic reaction today, will discharge on steroid burst and with instructions to take antihistamine daily for the next 7 days.  He should also follow-up closely with his allergist.  Herbie Baltimore voiced understanding with medical evaluation and treatment plan.  Each of his questions was answered to his expressed satisfaction.  Strict return precautions and EpiPen precautions were given.  Patient is well-appearing, stable, and appropriate for discharge at this time.  This chart was dictated using voice recognition software, Dragon. Despite the best efforts of this provider to proofread and correct errors, errors may still occur which can change documentation meaning.  Final Clinical Impression(s) / ED Diagnoses Final diagnoses:  Allergic reaction, initial encounter    Rx / DC Orders ED Discharge Orders         Ordered    predniSONE (DELTASONE) 50 MG tablet  Daily with breakfast        02/10/21 1411    EPINEPHrine 0.3 mg/0.3 mL IJ SOAJ injection  As needed        02/10/21 1412           Sael Furches, Gypsy Balsam, PA-C 02/10/21 1836    Varney Biles, MD 02/11/21 1226

## 2021-02-10 NOTE — ED Notes (Signed)
Pt standing by bed in hallway 2, pt states that he is feeling better and ready to go home, reviewed d/c instructions and follow up with pt, explained that prednisone may elevated his blood sugar and that he could come back if he needed anything, pt verbalized understanding, paper scrubs given, pt ambulatory from dpt, wife out front to pick up pt.

## 2021-02-10 NOTE — ED Notes (Signed)
Pt in bed with eyes closed, resps even and unlabored, pt on portable O2 sat monitor, pt satting 98% on 2L

## 2021-02-10 NOTE — ED Triage Notes (Signed)
Pt stung by bees x 3 or more pta. History of allergic reaction. Received IV benadryl and solumedrol pta. Appears sleepy, alert and oriented. Urticaria, n/v and diaphoretic on EMS arrival.

## 2021-02-10 NOTE — Discharge Instructions (Addendum)
You were seen in the ER today for your allergic reaction. Your physical exam and vital signs are reassuring. Your reaction subsided with administrations of medications in the ambulance in the emergency department.  Your vital signs were normal.  You have been prescribed a course of steroids called prednisone to take at home daily for the next 5 days; please also continue to take over-the-counter Zyrtec or Benadryl daily for the next week. You have also been given a refill of your epipen.  Please follow-up closely with your allergist and your primary care doctor.  Should you begin to have any new hives, swelling or tightness in your throat or your chest, or any other new severe symptoms, please keep your EpiPen on hand at home.  Return to the ER if you use your EpiPen, or if you develop any new difficulty breathing, nausea or vomiting does not stop, lightheadedness, dizziness, if you pass out, or develop any other new severe symptoms.

## 2021-02-12 ENCOUNTER — Ambulatory Visit: Payer: Self-pay

## 2021-02-12 ENCOUNTER — Other Ambulatory Visit: Payer: Self-pay

## 2021-02-12 ENCOUNTER — Encounter: Payer: Self-pay | Admitting: Allergy & Immunology

## 2021-02-12 ENCOUNTER — Ambulatory Visit (INDEPENDENT_AMBULATORY_CARE_PROVIDER_SITE_OTHER): Payer: 59 | Admitting: Allergy & Immunology

## 2021-02-12 VITALS — BP 112/62 | HR 76 | Temp 98.0°F | Resp 16

## 2021-02-12 DIAGNOSIS — T63481D Toxic effect of venom of other arthropod, accidental (unintentional), subsequent encounter: Secondary | ICD-10-CM

## 2021-02-12 NOTE — Patient Instructions (Signed)
1. Insect sting allergy (hornets, wasp, yellow jackets) - Continue with venom immunotherapy.  - EpiPen is up to date. - Your venom shots protect against 2-3 stings when you reached the top dose. - I would call an exterminator to look around the property for nests and give you ideas on preventing nests in the future.     2. Follow up as scheduled.    Please inform us of any Emergency Department visits, hospitalizations, or changes in symptoms. Call us before going to the ED for breathing or allergy symptoms since we might be able to fit you in for a sick visit. Feel free to contact us anytime with any questions, problems, or concerns.  It was a pleasure to see you again today!  Websites that have reliable patient information: 1. American Academy of Asthma, Allergy, and Immunology: www.aaaai.org 2. Food Allergy Research and Education (FARE): foodallergy.org 3. Mothers of Asthmatics: http://www.asthmacommunitynetwork.org 4. American College of Allergy, Asthma, and Immunology: www.acaai.org   COVID-19 Vaccine Information can be found at: ShippingScam.co.uk For questions related to vaccine distribution or appointments, please email vaccine@[facility] .com or call [phone].   We realize that you might be concerned about having an allergic reaction to the COVID19 vaccines. To help with that concern, WE ARE OFFERING THE COVID19 VACCINES IN OUR OFFICE! Ask the front desk for dates!     "Like" [place] on Facebook and Instagram for our latest updates!      A healthy democracy works best when [employer] participate! Make sure you are registered to vote! If you have moved or changed any of your contact information, you will need to get this updated before voting!  In some cases, you MAY be able to register to vote online: [email or web address]

## 2021-02-12 NOTE — Progress Notes (Signed)
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 Insect sting allergy (hornets, wasp, yellow jackets) - Continue with venom immunotherapy.  - EpiPen is up to date. - Your venom shots protect against 2-3 stings when you reached the top dose. - I would call an exterminator to look around the property for nests and give you ideas on preventing nests in the future.     2. Follow up as scheduled.   Subjective:   Douglas Robertson. is a 55 y.o. male presenting today for follow up of  Chief Complaint  Patient presents with  . Allergic Reaction    Yellow Jacket Sting    Douglas Robertson. has a history of the following: Patient Active Problem List   Diagnosis Date Noted  . Tear of left rotator cuff 07/04/2018  . Internal impingement of left shoulder 01/18/2018  . History of colonic polyps 05/18/2016  . Body mass index 35.0-35.9, adult 05/07/2016  . Acute bronchitis 01/28/2016  . 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 Gross hematuria 09/18/2015  . Biceps tendinitis of both shoulders 10/23/2014  . Right anterior knee pain 10/23/2014  . Contact dermatitis and other eczema 02/07/2014  . Type 2 or unspecified type diabetes mellitus, uncontrolled 09/18/2013  . 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 Left shoulder pain 04/12/2013  . Benign essential hypertension 04/06/2013  . Mixed hyperlipidemia 04/06/2013  . Routine general medical examination at a health care facility 04/06/2013  . Precordial pain 10/01/2011    History obtained from: chart review and patient.  Douglas Robertson is a 55 y.o. male presenting for a sick visit. He was out weed eating on Monday. He saw them swarming and he was bit on the finger, neck, and eye brow. He was also stung on the ear lobe and the arm. This was at least five  different stings. He walking back towards the house and put the weed eater down. He had knocked off his glasses during this time and he was stung again. HE had the EpiPen in the pocket and he popped the blue top but could not see. He started feeling "awkward". He texted his wife and she called 911 and they showed up to the house.  He was loaded into the ambulance and threw up. He also become incontinent. He was treated with prednisone and antihistamines. He started the prednisone yesterday. Mostly he is back to normal. He is taking cetirizine daily for the last few days. He did have some throat swelling on the evening of the sting. He does still have a residual headache. Overall he is feeling much better. He would like to go back to work tomorrow. He was told by HR that he needed FMLA forms filled out since it  Otherwise, there have been no changes to his past medical history, surgical history, family history, or social history.    Review of Systems  Constitutional: Negative.  Negative for chills, fever, malaise/fatigue and weight loss.  HENT: Negative.  Negative for congestion, ear discharge, ear pain, sinus pain and sore throat.   Eyes: Negative for pain, discharge and redness.  Respiratory: Negative for cough, sputum production, shortness of breath and wheezing.   Cardiovascular: Negative.  Negative for chest pain and palpitations.  Gastrointestinal: Negative for abdominal pain, constipation, diarrhea, heartburn, nausea and vomiting.  Skin: Negative.  Negative for itching and rash.  Neurological: Negative for dizziness and headaches.  Endo/Heme/Allergies: Negative for environmental allergies. Does not bruise/bleed easily.       Objective:   Blood pressure 112/62, pulse 76, temperature 98 F (36.7 C), temperature source Temporal, resp. rate 16, SpO2 97 %. There is no height or weight on file to calculate BMI.   Physical Exam:  Physical Exam Vitals reviewed.  Constitutional:       Appearance: He is well-developed.  HENT:     Head: Normocephalic and atraumatic.     Right Ear: Tympanic membrane, ear canal and external ear normal.     Left Ear: Tympanic membrane, ear canal and external ear normal.     Nose: No nasal deformity, septal deviation, mucosal edema or rhinorrhea.     Right Sinus: No maxillary sinus tenderness or frontal sinus tenderness.     Left Sinus: No maxillary sinus tenderness or frontal sinus tenderness.     Mouth/Throat:     Mouth: Mucous membranes are not pale and not dry.     Pharynx: Uvula midline.  Eyes:     General:        Right eye: No discharge.        Left eye: No discharge.     Conjunctiva/sclera: Conjunctivae normal.     Right eye: Right conjunctiva is not injected. No chemosis.    Left eye: Left conjunctiva is not injected. No chemosis.    Pupils: Pupils are equal, round, and reactive to light.  Cardiovascular:     Rate and Rhythm: Normal rate and regular rhythm.     Heart sounds: Normal heart sounds.  Pulmonary:     Effort: Pulmonary effort is normal. No tachypnea, accessory muscle usage or respiratory distress.     Breath sounds: Normal breath sounds. No wheezing, rhonchi or rales.  Chest:     Chest wall: No tenderness.  Lymphadenopathy:     Cervical: No cervical adenopathy.  Skin:    Coloration: Skin is not pale.     Findings: No abrasion, erythema, petechiae or rash. Rash is not papular, urticarial or vesicular.  Neurological:     Mental Status: He is alert.  Psychiatric:        Behavior: Behavior is cooperative.      Diagnostic studies: none     Salvatore Marvel, MD  [facility] [place]

## 2021-02-17 ENCOUNTER — Ambulatory Visit: Payer: 59 | Admitting: Podiatry

## 2021-02-17 ENCOUNTER — Encounter: Payer: Self-pay | Admitting: Podiatry

## 2021-02-17 ENCOUNTER — Other Ambulatory Visit: Payer: Self-pay

## 2021-02-17 DIAGNOSIS — M722 Plantar fascial fibromatosis: Secondary | ICD-10-CM

## 2021-02-17 DIAGNOSIS — M778 Other enthesopathies, not elsewhere classified: Secondary | ICD-10-CM

## 2021-02-19 ENCOUNTER — Other Ambulatory Visit: Payer: Self-pay

## 2021-02-19 ENCOUNTER — Ambulatory Visit (INDEPENDENT_AMBULATORY_CARE_PROVIDER_SITE_OTHER): Payer: 59

## 2021-02-19 DIAGNOSIS — Z91038 Other insect allergy status: Secondary | ICD-10-CM

## 2021-02-19 NOTE — Progress Notes (Signed)
Subjective: 55 year old male presents the office today for follow-up evaluation of right foot pain.  He states that he is having the same pain in the wise.  Injection did not help.  He has been using Voltaren gel.  Also meloxicam.  Majority of pain is when he is putting pressure on the foot but he does get consistent discomfort, just different severities. Denies any systemic complaints such as fevers, chills, nausea, vomiting. No acute changes since last appointment, and no other complaints at this time.   Objective: AAO x3, NAD DP/PT pulses palpable bilaterally, CRT less than 3 seconds Discontinuation of tenderness to palpation of the proximal aspect of right foot.  There is no specific area pinpoint tenderness identified today.  No edema..  Flexor, extensor tendons intact.  MMT 5/5.  No pain with calf compression, swelling, warmth, erythema  Assessment: Midfoot pain, capsulitis  Plan: -All treatment options discussed with the patient including all alternatives, risks, complications.  -Discussed MRI but if symptoms continue we will do this.  For now we will do a graphite insert.  We will check orthotic benefit compression as well.  Continue Voltaren, anti-inflammatories as needed.  We held off another steroid injection today. -Patient encouraged to call the office with any questions, concerns, change in symptoms.   Trula Slade DPM

## 2021-02-24 ENCOUNTER — Telehealth: Payer: Self-pay | Admitting: *Deleted

## 2021-02-24 NOTE — Telephone Encounter (Signed)
Called patient and stated that I had a graphite insert and I had to leave a message. Douglas Robertson

## 2021-02-26 ENCOUNTER — Ambulatory Visit (INDEPENDENT_AMBULATORY_CARE_PROVIDER_SITE_OTHER): Payer: 59

## 2021-02-26 ENCOUNTER — Other Ambulatory Visit: Payer: Self-pay

## 2021-02-26 DIAGNOSIS — Z91038 Other insect allergy status: Secondary | ICD-10-CM | POA: Diagnosis not present

## 2021-03-04 ENCOUNTER — Telehealth: Payer: Self-pay | Admitting: Podiatry

## 2021-03-04 NOTE — Telephone Encounter (Signed)
-----   Message from Trula Slade, DPM sent at 02/19/2021  2:12 PM EDT ----- Can you check orthotic coverage for him or give him the codes? Thanks!

## 2021-03-04 NOTE — Telephone Encounter (Signed)
Per May @ [facility] orthotics(L3020) is covered @ 100% of allowable and no deductible is applied. Covered 1 pr every 5 yrs max.. ref # [id number]...  Left message for pt.

## 2021-03-05 ENCOUNTER — Other Ambulatory Visit: Payer: Self-pay

## 2021-03-05 ENCOUNTER — Ambulatory Visit (INDEPENDENT_AMBULATORY_CARE_PROVIDER_SITE_OTHER): Payer: 59

## 2021-03-05 DIAGNOSIS — Z91038 Other insect allergy status: Secondary | ICD-10-CM

## 2021-03-12 ENCOUNTER — Other Ambulatory Visit: Payer: Self-pay

## 2021-03-12 ENCOUNTER — Ambulatory Visit (INDEPENDENT_AMBULATORY_CARE_PROVIDER_SITE_OTHER): Payer: 59

## 2021-03-12 DIAGNOSIS — Z91038 Other insect allergy status: Secondary | ICD-10-CM

## 2021-03-19 ENCOUNTER — Other Ambulatory Visit: Payer: Self-pay

## 2021-03-19 ENCOUNTER — Ambulatory Visit (INDEPENDENT_AMBULATORY_CARE_PROVIDER_SITE_OTHER): Payer: 59

## 2021-03-19 DIAGNOSIS — Z91038 Other insect allergy status: Secondary | ICD-10-CM | POA: Diagnosis not present

## 2021-03-23 ENCOUNTER — Other Ambulatory Visit: Payer: Self-pay | Admitting: Podiatry

## 2021-03-24 NOTE — Telephone Encounter (Signed)
Please advise 

## 2021-03-26 ENCOUNTER — Other Ambulatory Visit: Payer: Self-pay

## 2021-03-26 ENCOUNTER — Ambulatory Visit (INDEPENDENT_AMBULATORY_CARE_PROVIDER_SITE_OTHER): Payer: 59 | Admitting: *Deleted

## 2021-03-26 DIAGNOSIS — Z91038 Other insect allergy status: Secondary | ICD-10-CM | POA: Diagnosis not present

## 2021-04-01 ENCOUNTER — Ambulatory Visit: Payer: 59 | Admitting: Podiatry

## 2021-04-02 ENCOUNTER — Ambulatory Visit (INDEPENDENT_AMBULATORY_CARE_PROVIDER_SITE_OTHER): Payer: 59 | Admitting: *Deleted

## 2021-04-02 ENCOUNTER — Ambulatory Visit: Payer: Self-pay

## 2021-04-02 ENCOUNTER — Other Ambulatory Visit: Payer: Self-pay

## 2021-04-02 DIAGNOSIS — Z91038 Other insect allergy status: Secondary | ICD-10-CM | POA: Diagnosis not present

## 2021-04-09 ENCOUNTER — Other Ambulatory Visit: Payer: Self-pay

## 2021-04-09 ENCOUNTER — Ambulatory Visit (INDEPENDENT_AMBULATORY_CARE_PROVIDER_SITE_OTHER): Payer: 59

## 2021-04-09 DIAGNOSIS — Z91038 Other insect allergy status: Secondary | ICD-10-CM

## 2021-04-16 ENCOUNTER — Other Ambulatory Visit: Payer: Self-pay

## 2021-04-16 ENCOUNTER — Ambulatory Visit (INDEPENDENT_AMBULATORY_CARE_PROVIDER_SITE_OTHER): Payer: 59 | Admitting: *Deleted

## 2021-04-16 DIAGNOSIS — Z91038 Other insect allergy status: Secondary | ICD-10-CM

## 2021-04-23 ENCOUNTER — Other Ambulatory Visit: Payer: Self-pay

## 2021-04-23 ENCOUNTER — Ambulatory Visit (INDEPENDENT_AMBULATORY_CARE_PROVIDER_SITE_OTHER): Payer: 59

## 2021-04-23 DIAGNOSIS — Z91038 Other insect allergy status: Secondary | ICD-10-CM

## 2021-04-30 ENCOUNTER — Other Ambulatory Visit: Payer: Self-pay

## 2021-04-30 ENCOUNTER — Ambulatory Visit (INDEPENDENT_AMBULATORY_CARE_PROVIDER_SITE_OTHER): Payer: 59

## 2021-04-30 DIAGNOSIS — Z91038 Other insect allergy status: Secondary | ICD-10-CM

## 2021-05-07 ENCOUNTER — Ambulatory Visit: Payer: Self-pay

## 2021-05-14 ENCOUNTER — Ambulatory Visit (INDEPENDENT_AMBULATORY_CARE_PROVIDER_SITE_OTHER): Payer: 59

## 2021-05-14 ENCOUNTER — Other Ambulatory Visit: Payer: Self-pay

## 2021-05-14 DIAGNOSIS — Z91038 Other insect allergy status: Secondary | ICD-10-CM | POA: Diagnosis not present

## 2021-05-21 ENCOUNTER — Ambulatory Visit (INDEPENDENT_AMBULATORY_CARE_PROVIDER_SITE_OTHER): Payer: 59 | Admitting: *Deleted

## 2021-05-21 ENCOUNTER — Other Ambulatory Visit: Payer: Self-pay

## 2021-05-21 DIAGNOSIS — Z91038 Other insect allergy status: Secondary | ICD-10-CM | POA: Diagnosis not present

## 2021-05-28 ENCOUNTER — Other Ambulatory Visit: Payer: Self-pay

## 2021-05-28 ENCOUNTER — Ambulatory Visit (INDEPENDENT_AMBULATORY_CARE_PROVIDER_SITE_OTHER): Payer: 59

## 2021-05-28 ENCOUNTER — Ambulatory Visit: Payer: 59

## 2021-05-28 DIAGNOSIS — Z91038 Other insect allergy status: Secondary | ICD-10-CM

## 2021-06-11 ENCOUNTER — Ambulatory Visit (INDEPENDENT_AMBULATORY_CARE_PROVIDER_SITE_OTHER): Payer: 59

## 2021-06-11 ENCOUNTER — Other Ambulatory Visit: Payer: Self-pay

## 2021-06-11 DIAGNOSIS — Z91038 Other insect allergy status: Secondary | ICD-10-CM | POA: Diagnosis not present

## 2021-07-02 ENCOUNTER — Other Ambulatory Visit: Payer: Self-pay

## 2021-07-02 ENCOUNTER — Ambulatory Visit (INDEPENDENT_AMBULATORY_CARE_PROVIDER_SITE_OTHER): Payer: 59

## 2021-07-02 DIAGNOSIS — Z91038 Other insect allergy status: Secondary | ICD-10-CM

## 2021-07-03 ENCOUNTER — Emergency Department (HOSPITAL_COMMUNITY): Payer: Worker's Compensation

## 2021-07-03 ENCOUNTER — Encounter (HOSPITAL_COMMUNITY): Payer: Self-pay

## 2021-07-03 ENCOUNTER — Emergency Department (HOSPITAL_COMMUNITY)
Admission: EM | Admit: 2021-07-03 | Discharge: 2021-07-03 | Disposition: A | Discharge status: Home / Self Care | Payer: Worker's Compensation | Attending: Emergency Medicine | Admitting: Emergency Medicine

## 2021-07-03 DIAGNOSIS — E1122 Type 2 diabetes mellitus with diabetic chronic kidney disease: Secondary | ICD-10-CM | POA: Diagnosis not present

## 2021-07-03 DIAGNOSIS — I129 Hypertensive chronic kidney disease with stage 1 through stage 4 chronic kidney disease, or unspecified chronic kidney disease: Secondary | ICD-10-CM | POA: Insufficient documentation

## 2021-07-03 DIAGNOSIS — S61213A Laceration without foreign body of left middle finger without damage to nail, initial encounter: Secondary | ICD-10-CM | POA: Diagnosis not present

## 2021-07-03 DIAGNOSIS — Z7984 Long term (current) use of oral hypoglycemic drugs: Secondary | ICD-10-CM | POA: Insufficient documentation

## 2021-07-03 DIAGNOSIS — N189 Chronic kidney disease, unspecified: Secondary | ICD-10-CM | POA: Insufficient documentation

## 2021-07-03 DIAGNOSIS — Z87891 Personal history of nicotine dependence: Secondary | ICD-10-CM | POA: Insufficient documentation

## 2021-07-03 DIAGNOSIS — Z85528 Personal history of other malignant neoplasm of kidney: Secondary | ICD-10-CM | POA: Insufficient documentation

## 2021-07-03 DIAGNOSIS — Y990 Civilian activity done for income or pay: Secondary | ICD-10-CM | POA: Diagnosis not present

## 2021-07-03 DIAGNOSIS — Z79899 Other long term (current) drug therapy: Secondary | ICD-10-CM | POA: Diagnosis not present

## 2021-07-03 DIAGNOSIS — S6992XA Unspecified injury of left wrist, hand and finger(s), initial encounter: Secondary | ICD-10-CM | POA: Diagnosis present

## 2021-07-03 DIAGNOSIS — W260XXA Contact with knife, initial encounter: Secondary | ICD-10-CM | POA: Insufficient documentation

## 2021-07-03 MED ORDER — LIDOCAINE HCL 2 % IJ SOLN
15.0000 mL | Freq: Once | INTRAMUSCULAR | Status: AC
  Administered 2021-07-03: 300 mg via INTRADERMAL
  Filled 2021-07-03: qty 20

## 2021-07-03 NOTE — Discharge Instructions (Signed)
Please follow-up for suture removal at either urgent care ,the emergency department, or your primary care doctor in 7-10 days.  Please return to the emergency room immediately if you experience any new or worsening symptoms or any symptoms that indicate worsening infection such as fevers, increased redness/swelling/pain, warmth, or drainage from the affected area.   

## 2021-07-03 NOTE — ED Provider Notes (Signed)
Emergency Medicine Provider Triage Evaluation Note  Douglas Robertson. , a 55 y.o. male  was evaluated in triage.  Pt complains of laceration to the left middle finger the occurred pta. Seen at urgent care and sent here for further eval. Tdap updated there.  Review of Systems  Positive:  Negative:   Physical Exam  BP 110/87 (BP Location: Left Arm)   Pulse 65   Temp 97.9 F (36.6 C) (Oral)   Resp 17   SpO2 94%  Gen:   Awake, no distress   Resp:  Normal effort  MSK:   Moves extremities without difficulty  Other:  1.5 cm lac on the distal left middle finger pad  Medical Decision Making  Medically screening exam initiated at 5:03 PM.  Appropriate orders placed.  Douglas Robertson. was informed that the remainder of the evaluation will be completed by another provider, this initial triage assessment does not replace that evaluation, and the importance of remaining in the ED until their evaluation is complete.     Douglas Robertson 07/03/21 1704    Douglas Pence, MD 07/03/21 [phone]

## 2021-07-03 NOTE — ED Provider Notes (Signed)
[facility] DEPT Provider Note   CSN: [id number] Arrival date & time: 07/03/21  1620     History Chief Complaint  Patient presents with   Laceration    Douglas Ann. is a 55 y.o. male.  HPI   55 y/o male with a h/o left renal carcinoma, DM, eczema, HTN, who presents to the ED today for eval of left middle finger laceration that occurred PTA after he cut his finger at work with a knife. He is right handed. He was seen at urgent care pta and sent here for further eval. He received tdap pta at [facility].  Past Medical History:  Diagnosis Date   Cancer ([place])    kidney (L)   Chronic kidney disease 2017   left renal mass   Diabetes mellitus without complication ([place])    Eczema    Hypertension     Patient Active Problem List   Diagnosis Date Noted   Tear of left rotator cuff 07/04/2018   Internal impingement of left shoulder 01/18/2018   History of colonic polyps 05/18/2016   Body mass index 35.0-35.9, adult 05/07/2016   Acute bronchitis 01/28/2016   Neoplasm of left kidney 10/10/2015   Gross hematuria 09/18/2015   Biceps tendinitis of both shoulders 10/23/2014   Right anterior knee pain 10/23/2014   Contact dermatitis and other eczema 02/07/2014   Type 2 or unspecified type diabetes mellitus, uncontrolled 09/18/2013   Disorders of bursae and tendons in shoulder region, unspecified 04/12/2013   Left shoulder pain 04/12/2013   Benign essential hypertension 04/06/2013   Mixed hyperlipidemia 04/06/2013   Routine general medical examination at a health care facility 04/06/2013   Precordial pain 10/01/2011    Past Surgical History:  Procedure Laterality Date   COLONOSCOPY  2012   COLONOSCOPY N/A 09/03/2016   Procedure: COLONOSCOPY;  Surgeon: Rogene Houston, MD;  Location: [facility]ITE;  Service: Endoscopy;  Laterality: N/A;  [place]    right    LAPAROSCOPIC NEPHRECTOMY Left 10/10/2015   Procedure: LAPAROSCOPIC RADICAL  NEPHRECTOMY;  Surgeon: Raynelle Bring, MD;  Location: [facility] ORS;  Service: Urology;  Laterality: Left;   SHOULDER SURGERY         Family History  Problem Relation Age of Onset   Colon cancer Neg Hx     Social History   Tobacco Use   Smoking status: Former   Smokeless tobacco: Former  Scientific laboratory technician Use: Never used  Substance Use Topics   Alcohol use: No   Drug use: No    Home Medications Prior to Admission medications   Medication Sig Start Date End Date Taking? Authorizing Provider  atorvastatin (LIPITOR) 20 MG tablet Take 20 mg by mouth daily.    [provider]  diclofenac Sodium (VOLTAREN) 1 % GEL Apply 2 g topically 4 (four) times daily. Rub into affected area of foot 2 to 4 times daily 01/21/21   Trula Slade, DPM  EPINEPHrine (EPIPEN 2-PAK) 0.3 mg/0.3 mL IJ SOAJ injection Inject 0.3 mg into the muscle as needed for anaphylaxis. 10/09/20   Valentina Shaggy, MD  EPINEPHrine 0.3 mg/0.3 mL IJ SOAJ injection Inject 0.3 mg into the muscle as needed for anaphylaxis. 02/10/21   Sponseller, Gypsy Balsam, PA-C  meloxicam (MOBIC) 15 MG tablet TAKE 1 TABLET BY MOUTH EVERY DAY 03/24/21   Trula Slade, DPM  metFORMIN (GLUCOPHAGE-XR) 500 MG 24 hr tablet Take 2,000 mg by mouth daily.  02/23/20   [provider]  ONETOUCH DELICA LANCETS 47W MISC  08/25/16   [provider]  [facility] VERIO test strip  09/22/16   [provider]  testosterone cypionate (DEPOTESTOSTERONE CYPIONATE) 200 MG/ML injection Inject 200 mg into the muscle every 14 (fourteen) days. 11/16/19   [provider]  valsartan-hydrochlorothiazide (DIOVAN-HCT) 160-12.5 MG tablet Take 1 tablet by mouth daily. 10/28/19   [provider]    Allergies    Bee venom  Review of Systems   Review of Systems  Constitutional:  Negative for fever.  Skin:  Positive for wound.  Neurological:  Negative for weakness and numbness.   Physical Exam Updated Vital Signs BP  110/87 (BP Location: Left Arm)   Pulse 65   Temp 97.9 F (36.6 C) (Oral)   Resp 17   Ht 5\' 7"  (1.702 m)   Wt 95.3 kg   SpO2 94%   BMI 32.89 kg/m   Physical Exam Constitutional:      General: He is not in acute distress.    Appearance: He is well-developed.  Eyes:     Conjunctiva/sclera: Conjunctivae normal.  Cardiovascular:     Rate and Rhythm: Normal rate.  Pulmonary:     Effort: Pulmonary effort is normal.  Skin:    General: Skin is warm and dry.     Comments: 1.5 cm laceration to the left middle finger. Wound is distal and located to the finger pad  Neurological:     Mental Status: He is alert and oriented to person, place, and time.    ED Results / Procedures / Treatments   Labs (all labs ordered are listed, but only abnormal results are displayed) Labs Reviewed - No data to display  EKG None  Radiology DG Finger Middle Left  Result Date: 07/03/2021 CLINICAL DATA:  Finger pain laceration EXAM: LEFT MIDDLE FINGER 2+V COMPARISON:  None. FINDINGS: There is no evidence of fracture or dislocation. There is no evidence of arthropathy or other focal bone abnormality. Laceration at the distal digit without radiopaque foreign body. IMPRESSION: No acute osseous abnormality Electronically Signed   By: Douglas Robertson M.D.   On: 07/03/2021 17:30    Procedures .Marland KitchenLaceration Repair  Date/Time: 07/03/2021 8:36 PM Performed by: Rodney Booze, PA-C Authorized by: Rodney Booze, PA-C   Consent:    Consent obtained:  Verbal   Consent given by:  Patient   Risks, benefits, and alternatives were discussed: yes     Risks discussed:  Infection, pain and need for additional repair   Alternatives discussed:  No treatment Universal protocol:    Procedure explained and questions answered to patient or proxy's satisfaction: yes     Immediately prior to procedure, a time out was called: yes     Patient identity confirmed:  Verbally with patient Anesthesia:    Anesthesia method:   Nerve block and local infiltration   Local anesthetic:  Lidocaine 2% w/o epi   Block needle gauge:  25 G   Block anesthetic:  Lidocaine 2% w/o epi   Block injection procedure:  Anatomic landmarks identified, introduced needle, incremental injection, anatomic landmarks palpated and negative aspiration for blood   Block outcome:  Incomplete block Laceration details:    Location:  Finger   Finger location:  L long finger   Length (cm):  1.5 Pre-procedure details:    Preparation:  Patient was prepped and draped in usual sterile fashion and imaging obtained to evaluate for foreign bodies Exploration:  Limited defect created (wound extended): no     Hemostasis achieved with:  Direct pressure   Imaging obtained: x-ray     Imaging outcome: foreign body not noted     Wound exploration: wound explored through full range of motion and entire depth of wound visualized     Wound extent: no underlying fracture noted     Contaminated: no   Treatment:    Area cleansed with:  Saline and povidone-iodine   Amount of cleaning:  Extensive   Irrigation solution:  Sterile saline   Irrigation volume:  750cc   Irrigation method:  Pressure wash   Visualized foreign bodies/material removed: no     Debridement:  None   Undermining:  None   Scar revision: no   Skin repair:    Repair method:  Sutures   Suture size:  5-0   Suture material:  Prolene   Suture technique:  Simple interrupted   Number of sutures:  4 Approximation:    Approximation:  Close Repair type:    Repair type:  Simple Post-procedure details:    Dressing:  Open (no dressing)   Procedure completion:  Tolerated   Medications Ordered in ED Medications  lidocaine (XYLOCAINE) 2 % (with pres) injection 300 mg (has no administration in time range)    ED Course  I have reviewed the triage vital signs and the nursing notes.  Pertinent labs & imaging results that were available during my care of the patient were reviewed by me and  considered in my medical decision making (see chart for details).    MDM Rules/Calculators/A&P                          Pressure irrigation performed. Wound explored and base of wound visualized in a bloodless field without evidence of foreign body.  Laceration occurred < 8 hours prior to repair which was well tolerated. Tdap UTD.  Pt has  no comorbidities to effect normal wound healing. Pt discharged  without antibiotics.  Discussed suture home care with patient and answered questions. Pt to follow-up for wound check and suture removal in 7-10 days; they are to return to the ED sooner for signs of infection. Pt is hemodynamically stable with no complaints prior to dc.     Final Clinical Impression(s) / ED Diagnoses Final diagnoses:  Laceration of left middle finger without foreign body without damage to nail, initial encounter    Rx / DC Orders ED Discharge Orders     None        Bishop Dublin 07/03/21 2039    Isla Pence, MD 07/03/21 2203

## 2021-07-03 NOTE — ED Triage Notes (Signed)
Pt has lac to left hand middle finger. Pt was seen at [facility] and sent here for eval. Pt received tetanus already

## 2021-08-06 ENCOUNTER — Other Ambulatory Visit: Payer: Self-pay

## 2021-08-06 ENCOUNTER — Ambulatory Visit (INDEPENDENT_AMBULATORY_CARE_PROVIDER_SITE_OTHER): Payer: 59

## 2021-08-06 DIAGNOSIS — Z91038 Other insect allergy status: Secondary | ICD-10-CM | POA: Diagnosis not present

## 2021-09-03 ENCOUNTER — Ambulatory Visit (INDEPENDENT_AMBULATORY_CARE_PROVIDER_SITE_OTHER): Payer: 59 | Admitting: *Deleted

## 2021-09-03 ENCOUNTER — Other Ambulatory Visit: Payer: Self-pay

## 2021-09-03 DIAGNOSIS — Z91038 Other insect allergy status: Secondary | ICD-10-CM | POA: Diagnosis not present

## 2021-09-22 ENCOUNTER — Ambulatory Visit: Payer: 59

## 2021-09-22 ENCOUNTER — Other Ambulatory Visit: Payer: Self-pay

## 2021-09-22 ENCOUNTER — Ambulatory Visit: Payer: 59 | Admitting: Podiatry

## 2021-09-22 ENCOUNTER — Encounter: Payer: Self-pay | Admitting: Podiatry

## 2021-09-22 DIAGNOSIS — M76811 Anterior tibial syndrome, right leg: Secondary | ICD-10-CM

## 2021-09-22 DIAGNOSIS — M778 Other enthesopathies, not elsewhere classified: Secondary | ICD-10-CM

## 2021-09-22 MED ORDER — TRIAMCINOLONE ACETONIDE 40 MG/ML IJ SUSP
40.0000 mg | Freq: Once | INTRAMUSCULAR | Status: AC
  Administered 2021-09-22: 40 mg

## 2021-09-22 NOTE — Progress Notes (Signed)
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  Objective: He has pain on palpation of the tibialis anterior's insertion site on the left foot.  He also has capsulitis of the fourth fifth metatarsal base right foot.  Pulses are palpable neurologic sensorium is intact Deetjen reflexes are intact.  Assessment: Insertional tibialis anterior tendinitis and capsulitis of the fourth fifth tarsometatarsal joint.  Plan: I injected the right foot today at the tarsometatarsal joint inferiorly with 10 mg Kenalog 5 mg Marcaine left foot was injected with dexamethasone to the point of maximal tenderness just distal to the tibialis anterior tendon and in the subcutaneous tissues.  Follow-up with him at the [facility] office in 6 weeks

## 2021-10-01 ENCOUNTER — Other Ambulatory Visit: Payer: Self-pay

## 2021-10-01 ENCOUNTER — Ambulatory Visit (INDEPENDENT_AMBULATORY_CARE_PROVIDER_SITE_OTHER): Payer: 59

## 2021-10-01 DIAGNOSIS — Z91038 Other insect allergy status: Secondary | ICD-10-CM | POA: Diagnosis not present

## 2021-10-29 ENCOUNTER — Other Ambulatory Visit: Payer: Self-pay

## 2021-10-29 ENCOUNTER — Ambulatory Visit (INDEPENDENT_AMBULATORY_CARE_PROVIDER_SITE_OTHER): Payer: 59

## 2021-10-29 DIAGNOSIS — Z91038 Other insect allergy status: Secondary | ICD-10-CM | POA: Diagnosis not present

## 2021-11-04 ENCOUNTER — Other Ambulatory Visit: Payer: Self-pay

## 2021-11-04 ENCOUNTER — Ambulatory Visit: Payer: 59 | Admitting: Podiatry

## 2021-11-04 ENCOUNTER — Encounter: Payer: Self-pay | Admitting: Podiatry

## 2021-11-04 DIAGNOSIS — M722 Plantar fascial fibromatosis: Secondary | ICD-10-CM | POA: Diagnosis not present

## 2021-11-04 DIAGNOSIS — M76811 Anterior tibial syndrome, right leg: Secondary | ICD-10-CM

## 2021-11-04 MED ORDER — TRIAMCINOLONE ACETONIDE 40 MG/ML IJ SUSP
20.0000 mg | Freq: Once | INTRAMUSCULAR | Status: AC
  Administered 2021-11-04: 20 mg

## 2021-11-05 NOTE — Progress Notes (Signed)
He presents today states that he is doing better as far as the right foot goes to the arthritis however he still states that he still having some tenderness just beneath the insertion of the tibialis anterior tendon and in that left heel.  Objective: Vital signs are stable he is alert and oriented x3 right foot not as tender left foot still demonstrates pain on palpation MucoClear tubercle left.  They are much improved from previous evaluations.  Minimal tenderness on palpation of the insertion of the tibialis anterior tendon.  Assessment: Planter fasciitis of the left foot most likely resulting in some compensatory tibialis anterior insertion pain.  Plan: Injected that area today 20 mg Kenalog 5 mg Marcaine point maximal tenderness of the plantar fascia calcaneal insertion site.  Tolerated procedure well follow-up with him in a couple of months if necessary.

## 2021-11-25 ENCOUNTER — Other Ambulatory Visit: Payer: Self-pay | Admitting: Urology

## 2021-11-26 ENCOUNTER — Ambulatory Visit: Payer: 59

## 2021-11-28 ENCOUNTER — Other Ambulatory Visit: Payer: Self-pay

## 2021-11-28 ENCOUNTER — Ambulatory Visit (INDEPENDENT_AMBULATORY_CARE_PROVIDER_SITE_OTHER): Payer: 59

## 2021-11-28 DIAGNOSIS — Z91038 Other insect allergy status: Secondary | ICD-10-CM

## 2021-12-11 NOTE — Patient Instructions (Signed)
DUE TO COVID-19 ONLY ONE VISITOR  (aged 56 and older)  IS ALLOWED TO COME WITH YOU AND STAY IN THE WAITING ROOM ONLY DURING PRE OP AND PROCEDURE.   ?**NO VISITORS ARE ALLOWED IN THE SHORT STAY AREA OR RECOVERY ROOM!!** ? ?IF YOU WILL BE ADMITTED INTO THE HOSPITAL YOU ARE ALLOWED ONLY TWO SUPPORT PEOPLE DURING VISITATION HOURS ONLY (7 AM -8PM)   ?The support person(s) must pass our screening, gel in and out, and wear a mask at all times, including in the patient?s room. ?Patients must also wear a mask when staff or their support person are in the room. ?Visitors GUEST BADGE MUST BE WORN VISIBLY  ?One adult visitor may remain with you overnight and MUST be in the room by 8 P.M. ?  ? ? Your procedure is scheduled on: 01/01/22 ? ? Report to [facility] Main Entrance ? ?  Report to Short Stay at : 5:15 AM ? ? Call this number if you have problems the morning of surgery [phone] ? ? Do not eat food :After Midnight. ? ? After Midnight you may have the following liquids until : 4:30 AM DAY OF SURGERY ? ?Water ?Black Coffee (sugar ok, NO MILK/CREAM OR CREAMERS)  ?Tea (sugar ok, NO MILK/CREAM OR CREAMERS) regular and decaf                             ?Plain Jell-O (NO RED)                                           ?Fruit ices (not with fruit pulp, NO RED)                                     ?Popsicles (NO RED)                                                                  ?Juice: apple, WHITE grape, WHITE cranberry ?Sports drinks like Gatorade (NO RED) ?Clear broth(vegetable,chicken,beef) ? ?Oral Hygiene is also important to reduce your risk of infection.                                    ?Remember - BRUSH YOUR TEETH THE MORNING OF SURGERY WITH YOUR REGULAR TOOTHPASTE ? ? Do NOT smoke after Midnight ? ? Take these medicines the morning of surgery with A SIP OF WATER: N/A ?How to Manage Your Diabetes ?Before and After Surgery ? ?Why is it important to control my blood sugar before and after surgery? ?Improving  blood sugar levels before and after surgery helps healing and can limit problems. ?A way of improving blood sugar control is eating a healthy diet by: ? Eating less sugar and carbohydrates ? Increasing activity/exercise ? Talking with your doctor about reaching your blood sugar goals ?High blood sugars (greater than 180 mg/dL) can raise your risk of infections and slow your recovery, so you will need to focus on  controlling your diabetes during the weeks before surgery. ?Make sure that the doctor who takes care of your diabetes knows about your planned surgery including the date and location. ? ?How do I manage my blood sugar before surgery? ?Check your blood sugar at least 4 times a day, starting 2 days before surgery, to make sure that the level is not too high or low. ?Check your blood sugar the morning of your surgery when you wake up and every 2 hours until you get to the Short Stay unit. ?If your blood sugar is less than 70 mg/dL, you will need to treat for low blood sugar: ?Do not take insulin. ?Treat a low blood sugar (less than 70 mg/dL) with ? cup of clear juice (cranberry or apple), 4 glucose tablets, OR glucose gel. ?Recheck blood sugar in 15 minutes after treatment (to make sure it is greater than 70 mg/dL). If your blood sugar is not greater than 70 mg/dL on recheck, call [phone] for further instructions. ?Report your blood sugar to the short stay nurse when you get to Short Stay. ? ?If you are admitted to the hospital after surgery: ?Your blood sugar will be checked by the staff and you will probably be given insulin after surgery (instead of oral diabetes medicines) to make sure you have good blood sugar levels. ?The goal for blood sugar control after surgery is 80-180 mg/dL. ? ?WHAT DO I DO ABOUT MY DIABETES MEDICATION? ? ?Do not take oral diabetes medicines (pills) the morning of surgery. ? ?THE DAY BEFORE SURGERY, take metformin as usual.     ? ?THE MORNING OF SURGERY, DO NOT TAKE ANY ORAL  DIABETIC MEDICATIONS DAY OF YOUR SURGERY ? ?Bring CPAP mask and tubing day of surgery. ?                  ?           You may not have any metal on your body including hair pins, jewelry, and body piercing ? ?           Do not wear lotions, powders, perfumes/cologne, or deodorant ? ?            Men may shave face and neck. ? ? Do not bring valuables to the hospital. [facility] NOT ?            RESPONSIBLE   FOR VALUABLES. ? ? Contacts, dentures or bridgework may not be worn into surgery. ? ? Bring small overnight bag day of surgery. ?  ? Patients discharged on the day of surgery will not be allowed to drive home.  Someone NEEDS to stay with you for the first 24 hours after anesthesia. ? ? Special Instructions: Bring a copy of your healthcare power of attorney and living will documents         the day of surgery if you haven't scanned them before. ? ?            Please read over the following fact sheets you were given: IF [place] [phone] ? ?    - Preparing for Surgery ?Before surgery, you can play an important role.  Because skin is not sterile, your skin needs to be as free of germs as possible.  You can reduce the number of germs on your skin by washing with CHG (chlorahexidine gluconate) soap before surgery.  CHG is an antiseptic cleaner which kills germs and bonds  with the skin to continue killing germs even after washing. ?Please DO NOT use if you have an allergy to CHG or antibacterial soaps.  If your skin becomes reddened/irritated stop using the CHG and inform your nurse when you arrive at Short Stay. ?Do not shave (including legs and underarms) for at least 48 hours prior to the first CHG shower.  You may shave your face/neck. ?Please follow these instructions carefully: ? 1.  Shower with CHG Soap the night before surgery and the  morning of Surgery. ? 2.  If you choose to wash your hair, wash your hair first as usual with your   normal  shampoo. ? 3.  After you shampoo, rinse your hair and body thoroughly to remove the  shampoo.                           4.  Use CHG as you would any other liquid soap.  You can apply chg directly  to the skin and wash  ?                     Gently with a scrungie or clean washcloth. ? 5.  Apply the CHG Soap to your body ONLY FROM THE NECK DOWN.   Do not use on face/ open      ?                     Wound or open sores. Avoid contact with eyes, ears mouth and genitals (private parts).  ?                     Production manager,  Genitals (private parts) with your normal soap. ?            6.  Wash thoroughly, paying special attention to the area where your surgery  will be performed. ? 7.  Thoroughly rinse your body with warm water from the neck down. ? 8.  DO NOT shower/wash with your normal soap after using and rinsing off  the CHG Soap. ?               9.  Pat yourself dry with a clean towel. ?           10.  Wear clean pajamas. ?           11.  Place clean sheets on your bed the night of your first shower and do not  sleep with pets. ?Day of Surgery : ?Do not apply any lotions/deodorants the morning of surgery.  Please wear clean clothes to the hospital/surgery center. ? ?FAILURE TO FOLLOW THESE INSTRUCTIONS MAY RESULT IN THE CANCELLATION OF YOUR SURGERY ?PATIENT SIGNATURE_________________________________ ? ?NURSE SIGNATURE__________________________________ ? ?________________________________________________________________________  ?

## 2021-12-15 ENCOUNTER — Encounter (HOSPITAL_COMMUNITY): Payer: Self-pay

## 2021-12-15 ENCOUNTER — Encounter (HOSPITAL_COMMUNITY)
Admission: RE | Admit: 2021-12-15 | Discharge: 2021-12-15 | Disposition: A | Discharge status: Home / Self Care | Payer: 59 | Source: Ambulatory Visit | Attending: Urology | Admitting: Urology

## 2021-12-15 ENCOUNTER — Other Ambulatory Visit: Payer: Self-pay

## 2021-12-15 VITALS — BP 143/87 | HR 65 | Temp 98.1°F | Resp 18 | Ht 67.0 in | Wt 208.0 lb

## 2021-12-15 DIAGNOSIS — I10 Essential (primary) hypertension: Secondary | ICD-10-CM | POA: Insufficient documentation

## 2021-12-15 DIAGNOSIS — E119 Type 2 diabetes mellitus without complications: Secondary | ICD-10-CM | POA: Diagnosis not present

## 2021-12-15 DIAGNOSIS — Z01812 Encounter for preprocedural laboratory examination: Secondary | ICD-10-CM | POA: Insufficient documentation

## 2021-12-15 LAB — HEMOGLOBIN A1C
Hgb A1c MFr Bld: 6 % — ABNORMAL HIGH (ref 4.8–5.6)
Mean Plasma Glucose: 125.5 mg/dL

## 2021-12-15 LAB — BASIC METABOLIC PANEL
Anion gap: 5 (ref 5–15)
BUN: 17 mg/dL (ref 6–20)
CO2: 27 mmol/L (ref 22–32)
Calcium: 9.4 mg/dL (ref 8.9–10.3)
Chloride: 105 mmol/L (ref 98–111)
Creatinine, Ser: 1.32 mg/dL — ABNORMAL HIGH (ref 0.61–1.24)
GFR, Estimated: >60 mL/min (ref >60)
Glucose, Bld: 109 mg/dL — ABNORMAL HIGH (ref 70–99)
Potassium: 3.9 mmol/L (ref 3.5–5.1)
Sodium: 137 mmol/L (ref 135–145)

## 2021-12-15 LAB — CBC
HCT: 47.3 % (ref 39.0–52.0)
Hemoglobin: 15.8 g/dL (ref 13.0–17.0)
MCH: 29.1 pg (ref 26.0–34.0)
MCHC: 33.4 g/dL (ref 30.0–36.0)
MCV: 87.1 fL (ref 80.0–100.0)
Platelets: 168 10*3/uL (ref 150–400)
RBC: 5.43 MIL/uL (ref 4.22–5.81)
RDW: 13.6 % (ref 11.5–15.5)
WBC: 8 10*3/uL (ref 4.0–10.5)
nRBC: 0 % (ref 0.0–0.2)

## 2021-12-15 LAB — GLUCOSE, CAPILLARY: Glucose-Capillary: 110 mg/dL — ABNORMAL HIGH (ref 70–99)

## 2021-12-15 NOTE — Progress Notes (Signed)
For Short Stay: ?[facility] appointment date: ?Date of COVID positive in last 90 days: ?COVID Vaccine: Moderna x 2 ?Bowel Prep reminder: ? ? ?For Anesthesia: ?PCP - Dr. Gar Ponto ?Cardiologist -  ? ?Chest x-ray -  ?EKG - 02/11/21 ?Stress Test -  ?ECHO -  ?Cardiac Cath -  ?Pacemaker/ICD device last checked: ?Pacemaker orders received: ?Device Rep notified: ? ?Spinal Cord Stimulator: ? ?Sleep Study -  ?CPAP -  ? ?Fasting Blood Sugar - 100's ?Checks Blood Sugar ___2__ times a week. ?Date and result of last Hgb A1c- ? ?Blood Thinner Instructions: ?Aspirin Instructions: ?Last Dose: ? ?Activity level: Can go up a flight of stairs and activities of daily living without stopping and without chest pain and/or shortness of breath ?  Able to exercise without chest pain and/or shortness of breath ?  Unable to go up a flight of stairs without chest pain and/or shortness of breath ?   ? ?Anesthesia review: Hx: DIA,HTN ? ?Patient denies shortness of breath, fever, cough and chest pain at PAT appointment ? ? ?Patient verbalized understanding of instructions that were given to them at the PAT appointment. Patient was also instructed that they will need to review over the PAT instructions again at home before surgery.  ?

## 2021-12-15 NOTE — Progress Notes (Signed)
Lab. Report: WBC: 15.1 ?

## 2021-12-24 ENCOUNTER — Ambulatory Visit (INDEPENDENT_AMBULATORY_CARE_PROVIDER_SITE_OTHER): Payer: 59

## 2021-12-24 DIAGNOSIS — Z91038 Other insect allergy status: Secondary | ICD-10-CM | POA: Diagnosis not present

## 2021-12-31 ENCOUNTER — Encounter (HOSPITAL_COMMUNITY): Payer: Self-pay | Admitting: Urology

## 2021-12-31 NOTE — Anesthesia Preprocedure Evaluation (Addendum)
Anesthesia Evaluation  ?Patient identified by MRN, date of birth, ID band ?Patient awake ? ? ? ?Reviewed: ?Allergy & Precautions, NPO status , Patient's Chart, lab work & pertinent test results ? ?Airway ?Mallampati: II ? ?TM Distance: <3 FB ?Neck ROM: Full ? ? ? Dental ?no notable dental hx. ? ?  ?Pulmonary ?neg pulmonary ROS, former smoker,  ?  ?Pulmonary exam normal ?breath sounds clear to auscultation ? ? ? ? ? ? Cardiovascular ?hypertension, Pt. on medications ?Normal cardiovascular exam ?Rhythm:Regular Rate:Normal ? ? ?  ?Neuro/Psych ?negative neurological ROS ? negative psych ROS  ? GI/Hepatic ?negative GI ROS, Neg liver ROS,   ?Endo/Other  ?diabetes, Type 2 ? Renal/GU ?negative Renal ROS  ?negative genitourinary ?  ?Musculoskeletal ?negative musculoskeletal ROS ?(+)  ? Abdominal ?  ?Peds ?negative pediatric ROS ?(+)  Hematology ?negative hematology ROS ?(+)   ?Anesthesia Other Findings ? ? Reproductive/Obstetrics ?negative OB ROS ? ?  ? ? ? ? ? ? ? ? ? ? ? ? ? ?  ?  ? ? ? ? ? ? ? ?Anesthesia Physical ?Anesthesia Plan ? ?ASA: 2 ? ?Anesthesia Plan: General  ? ?Post-op Pain Management: Minimal or no pain anticipated  ? ?Induction: Intravenous ? ?PONV Risk Score and Plan: 2 and Ondansetron, Treatment may vary due to age or medical condition and Midazolam ? ?Airway Management Planned: LMA ? ?Additional Equipment:  ? ?Intra-op Plan:  ? ?Post-operative Plan: Extubation in OR ? ?Informed Consent: I have reviewed the patients History and Physical, chart, labs and discussed the procedure including the risks, benefits and alternatives for the proposed anesthesia with the patient or authorized representative who has indicated his/her understanding and acceptance.  ? ? ? ?Dental advisory given ? ?Plan Discussed with: CRNA and Surgeon ? ?Anesthesia Plan Comments:   ? ? ? ? ? ? ?Anesthesia Quick Evaluation ? ?

## 2021-12-31 NOTE — H&P (Signed)
? ? ?Office Visit Report     11/06/2021  ? ?-------------------------------------------------------------------------------- ?  ?Douglas Robertson  ?MRN: [id number]  ?DOB: 1965-09-18, 56 year old Male  ?SSN: [id number]  ? PRIMARY CARE:  Douglas Na. Quillian Quince, MD  ?REFERRING:    ?PROVIDER:  Raynelle Robertson, M.D.  ?TREATING:  Douglas Crocker, NP  ?LOCATION:  [facility] [phone]  ?  ? ?-------------------------------------------------------------------------------- ?  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 He has denied any recent new hematuria, unintentional weight loss, new pain symptoms, or new voiding complaints. He apparently did have some recent laboratory work through his primary care physician indicating a serum creatinine of 1.28 which was slightly above his baseline level. His PSA on 12/11/2020 was 0.5. He follows up today with follow-up imaging studies. With regard to his left hydrocele, this remains subjectively stable. This has not become more painful or bothersome to him.  ? ?11/06/2021: Noted stability at time of last office visit indicated no need for return evaluation for ongoing renal cell carcinoma surveillance or evaluation for his known left-sided hydrocele. As needed follow-up instructions given. Now back today for evaluation due to developing more bothersome issues with his known left-sided hydrocele. Over the last several months he thinks it may have increased in size by some but mostly is becoming more cumbersome in nature affecting normal daily activities and working. Also causing some discomfort, occasional pain but usually this is related to position changes during the day and at night. Denies any new or worsening lower urinary tract symptoms. No interval dysuria or gross hematuria.  ? ?  ?ALLERGIES: No Allergies ?  ? ?MEDICATIONS: Lipitor 20 mg tablet  ?Metformin Hcl 500 mg tablet   ?Valsartan  ?  ? ?GU PSH: Locm 300-'399Mg'$ /Ml Iodine,1Ml - 12/10/2020, 12/22/2019, 2020, 2019, 2019, 2018, 2018, 2017 ?Radical nephrectomy (laparoscopic) - 2017 ? ?  ?   ?PSH Notes: Kidney Surgery Laparoscopic Radical Nephrectomy, Knee Surgery  ? ?NON-GU PSH: Shoulder Surgery (Unspecified), Left ? ?  ? ?GU PMH: History of kidney cancer - 12/18/2020, - 12/10/2020 (Stable), - 2018 ?Hydrocele - 12/18/2020, - 2020 ?Hydrocele, Unspec - 2017 ?Renal cell carcinoma, left, Renal cell carcinoma, left - 2017 ?Gross hematuria, Gross hematuria - 2017 ?Left renal neoplasm, Neoplasm of left kidney - 2017 ?Testicular pain, unspecified, Testicular pain - 2015 ?ED due to arterial insufficiency, Erectile dysfunction due to arterial insufficiency - 2014 ?Epididymitis, Epididymitis - 2014 ?  ?   ?PMH Notes:  ? ?1) Renal cell carcinoma: He presented with gross hematuria in January 2017 and was found to have a large left enhancing renal mass. He underwent treatment with a left laparoscopic radical nephrectomy on 10/10/15 for a 9.0 cm left renal mass. He was noted to have an incidental small pulmonary nodule during his staging evaluation that was non-specific.  ? 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 ?2) Right testicular pain: He developed pain in the area of his right epididymis in June 2009 after an inciting mild traumatic event.  ? ?3) Erectile dysfunction: He has mild erectile dysfunction which has not required treatment.  ? ?Baseline SHIM: 23  ?  ? ?NON-GU PMH: Diabetes Type 2 ?Hypertension ?  ? ?FAMILY HISTORY: Family Health Status - Father alive at age 82 - 56 In Family ?Family Health Status - Mother's Age - Runs In Family ?Family Health  Status Children _4__ Living Daughter - Runs In Family ?Heart Disease - Father ?Hematuria - Father  ? ?SOCIAL HISTORY: Marital Status: Married ?Preferred Language: [place]; Ethnicity: Not Hispanic Or Latino; Race: White ?Current Smoking Status: Patient has  never smoked.  ?Has never drank.  ?Does not drink caffeine. ?Patient's occupation Publishing copy. ?  ?  Notes: Former smoker, Alcohol Use, Occupation:, Marital History - Currently Married  ? ?REVIEW OF SYSTEMS:    ?GU Review Male:   Patient denies frequent urination, hard to postpone urination, burning/ pain with urination, get up at night to urinate, leakage of urine, stream starts and stops, trouble starting your stream, have to strain to urinate , erection problems, and penile pain.  ?Gastrointestinal (Upper):   Patient denies vomiting, nausea, and indigestion/ heartburn.  ?Gastrointestinal (Lower):   Patient denies diarrhea and constipation.  ?Constitutional:   Patient denies fever, night sweats, weight loss, and fatigue.  ?Skin:   Patient denies skin rash/ lesion and itching.  ?Eyes:   Patient denies blurred vision and double vision.  ?Ears/ Nose/ Throat:   Patient denies sore throat and sinus problems.  ?Hematologic/Lymphatic:   Patient denies swollen glands and easy bruising.  ?Cardiovascular:   Patient denies leg swelling and chest pains.  ?Respiratory:   Patient denies cough and shortness of breath.  ?Endocrine:   Patient denies excessive thirst.  ?Musculoskeletal:   Patient denies back pain and joint pain.  ?Neurological:   Patient denies headaches and dizziness.  ?Psychologic:   Patient denies depression and anxiety.  ? ?VITAL SIGNS:    ?  11/06/2021 12:44 PM  ?BP 122/74 mmHg  ?Pulse 72 /min  ?Temperature 97.5 F / 36.3 C  ? ?GU PHYSICAL EXAMINATION:     ? ?Notes: His right testis is palpably normal and without tenderness or masses. He does have a stable moderate-sized left hydrocele which is unchanged from prior evaluations. His left testis is not palpable due to his hydrocele.  ? ?Slight increase in size per patient report but exam today grossly unchanged from the above-mentioned findings.  ? ?MULTI-SYSTEM PHYSICAL EXAMINATION:    ?Constitutional: Well-nourished. No physical deformities. Normally  developed. Good grooming.  ?Neck: Neck symmetrical, not swollen. Normal tracheal position.  ?Respiratory: No labored breathing, no use of accessory muscles.   ?Cardiovascular: Normal temperature, normal extremity pulses, no swelling, no varicosities.  ?Skin: No paleness, no jaundice, no cyanosis. No lesion, no ulcer, no rash.  ?Neurologic / Psychiatric: Oriented to time, oriented to place, oriented to person. No depression, no anxiety, no agitation.  ?Gastrointestinal: No hernia. Well healed incision. No mass, no tenderness, no rigidity, non obese abdomen.   ?Musculoskeletal: Normal gait and station of head and neck.  ? ?  ?Complexity of Data:  ?Source Of History:  Patient, Medical Record Summary  ?Lab Test Review:   PSA, CMP  ?Records Review:   Pathology Reports, Previous Doctor Records, Previous Hospital Records  ?Urine Test Review:   Urinalysis  ?X-Ray Review: C.T. Chest/ Abd/Pelvis: Reviewed Films. Reviewed Report.  ?  ? 12/12/19  ?PSA  ?Total PSA 0.7 ng/dl  ? ? 11/06/21  ?Urinalysis  ?Urine Appearance Clear   ?Urine Color Yellow   ?Urine Glucose Neg mg/dL  ?Urine Bilirubin Neg mg/dL  ?Urine Ketones Neg mg/dL  ?Urine Specific Gravity 1.015   ?Urine Blood Neg ery/uL  ?Urine pH 6.0   ?Urine Protein Trace mg/dL  ?Urine Urobilinogen 0.2 mg/dL  ?Urine Nitrites Neg   ?Urine Leukocyte Esterase Neg leu/uL  ? ?PROCEDURES:    ? ?  Urinalysis ?Dipstick Dipstick Cont'd  ?Color: Yellow Bilirubin: Neg mg/dL  ?Appearance: Clear Ketones: Neg mg/dL  ?Specific Gravity: 1.015 Blood: Neg ery/uL  ?pH: 6.0 Protein: Trace mg/dL  ?Glucose: Neg mg/dL Urobilinogen: 0.2 mg/dL  ?  Nitrites: Neg  ?  Leukocyte Esterase: Neg leu/uL  ? ? ?ASSESSMENT:  ?    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 ?PLAN:    ? ?      Schedule ?Return Visit/Planned Activity: Other See Visit Notes - Follow up MD  ?           Note: Pt would like Hydrocele treatment, I will discuss with Dr. Alinda Money and patient will be contacted with  recommendations and follow-up  ? ? ?      Document ?Letter(s):  Created for Patient: Clinical Summary  ? ? ?     Notes:   Continues with a moderate left-sided hydrocele previously confirmed on prior scrotal ult

## 2022-01-01 ENCOUNTER — Ambulatory Visit: Payer: 59 | Admitting: Podiatry

## 2022-01-01 ENCOUNTER — Encounter (HOSPITAL_COMMUNITY)
Admission: RE | Disposition: A | Discharge status: Home / Self Care | Payer: Self-pay | Source: Home / Self Care | Attending: Urology

## 2022-01-01 ENCOUNTER — Encounter (HOSPITAL_COMMUNITY): Payer: Self-pay | Admitting: Urology

## 2022-01-01 ENCOUNTER — Ambulatory Visit (HOSPITAL_BASED_OUTPATIENT_CLINIC_OR_DEPARTMENT_OTHER): Payer: 59 | Admitting: Anesthesiology

## 2022-01-01 ENCOUNTER — Ambulatory Visit (HOSPITAL_COMMUNITY): Payer: 59 | Admitting: Anesthesiology

## 2022-01-01 ENCOUNTER — Ambulatory Visit (HOSPITAL_COMMUNITY)
Admission: RE | Admit: 2022-01-01 | Discharge: 2022-01-01 | Disposition: A | Discharge status: Home / Self Care | Payer: 59 | Attending: Urology | Admitting: Urology

## 2022-01-01 DIAGNOSIS — Z87891 Personal history of nicotine dependence: Secondary | ICD-10-CM | POA: Diagnosis not present

## 2022-01-01 DIAGNOSIS — N529 Male erectile dysfunction, unspecified: Secondary | ICD-10-CM | POA: Insufficient documentation

## 2022-01-01 DIAGNOSIS — N433 Hydrocele, unspecified: Secondary | ICD-10-CM | POA: Insufficient documentation

## 2022-01-01 DIAGNOSIS — Z85528 Personal history of other malignant neoplasm of kidney: Secondary | ICD-10-CM | POA: Insufficient documentation

## 2022-01-01 DIAGNOSIS — I10 Essential (primary) hypertension: Secondary | ICD-10-CM | POA: Diagnosis not present

## 2022-01-01 DIAGNOSIS — E119 Type 2 diabetes mellitus without complications: Secondary | ICD-10-CM | POA: Diagnosis not present

## 2022-01-01 DIAGNOSIS — Z905 Acquired absence of kidney: Secondary | ICD-10-CM | POA: Diagnosis not present

## 2022-01-01 HISTORY — PX: HYDROCELE EXCISION: SHX482

## 2022-01-01 LAB — GLUCOSE, CAPILLARY
Glucose-Capillary: 111 mg/dL — ABNORMAL HIGH (ref 70–99)
Glucose-Capillary: 161 mg/dL — ABNORMAL HIGH (ref 70–99)

## 2022-01-01 SURGERY — HYDROCELECTOMY
Anesthesia: General | Site: Scrotum | Laterality: Left

## 2022-01-01 MED ORDER — KETOROLAC TROMETHAMINE 30 MG/ML IJ SOLN
INTRAMUSCULAR | Status: AC
  Filled 2022-01-01: qty 1

## 2022-01-01 MED ORDER — DEXMEDETOMIDINE (PRECEDEX) IN NS 20 MCG/5ML (4 MCG/ML) IV SYRINGE
PREFILLED_SYRINGE | INTRAVENOUS | Status: AC
  Filled 2022-01-01: qty 5

## 2022-01-01 MED ORDER — DEXMEDETOMIDINE (PRECEDEX) IN NS 20 MCG/5ML (4 MCG/ML) IV SYRINGE
PREFILLED_SYRINGE | INTRAVENOUS | Status: DC | PRN
  Administered 2022-01-01: 8 ug via INTRAVENOUS
  Administered 2022-01-01: 4 ug via INTRAVENOUS

## 2022-01-01 MED ORDER — ONDANSETRON HCL 4 MG/2ML IJ SOLN: 4.0000 mg | Freq: Once | INTRAMUSCULAR | Status: DC | PRN

## 2022-01-01 MED ORDER — PROPOFOL 10 MG/ML IV BOLUS
INTRAVENOUS | Status: DC | PRN
  Administered 2022-01-01: 200 mg via INTRAVENOUS

## 2022-01-01 MED ORDER — FENTANYL CITRATE (PF) 100 MCG/2ML IJ SOLN
INTRAMUSCULAR | Status: DC | PRN
  Administered 2022-01-01 (×2): 25 ug via INTRAVENOUS
  Administered 2022-01-01: 50 ug via INTRAVENOUS

## 2022-01-01 MED ORDER — DEXAMETHASONE SODIUM PHOSPHATE 10 MG/ML IJ SOLN
INTRAMUSCULAR | Status: AC
  Filled 2022-01-01: qty 3

## 2022-01-01 MED ORDER — LIDOCAINE 2% (20 MG/ML) 5 ML SYRINGE
INTRAMUSCULAR | Status: DC | PRN
  Administered 2022-01-01: 100 mg via INTRAVENOUS

## 2022-01-01 MED ORDER — ONDANSETRON HCL 4 MG/2ML IJ SOLN
INTRAMUSCULAR | Status: AC
  Filled 2022-01-01: qty 6

## 2022-01-01 MED ORDER — MIDAZOLAM HCL 5 MG/5ML IJ SOLN
INTRAMUSCULAR | Status: DC | PRN
  Administered 2022-01-01: 2 mg via INTRAVENOUS

## 2022-01-01 MED ORDER — CHLORHEXIDINE GLUCONATE 0.12 % MT SOLN
15.0000 mL | Freq: Once | OROMUCOSAL | Status: AC
  Administered 2022-01-01: 15 mL via OROMUCOSAL

## 2022-01-01 MED ORDER — EPHEDRINE SULFATE (PRESSORS) 50 MG/ML IJ SOLN
INTRAMUSCULAR | Status: DC | PRN
  Administered 2022-01-01 (×2): 5 mg via INTRAVENOUS
  Administered 2022-01-01: 10 mg via INTRAVENOUS
  Administered 2022-01-01: 5 mg via INTRAVENOUS

## 2022-01-01 MED ORDER — ORAL CARE MOUTH RINSE: 15.0000 mL | Freq: Once | OROMUCOSAL | Status: AC

## 2022-01-01 MED ORDER — EPHEDRINE 5 MG/ML INJ
INTRAVENOUS | Status: AC
  Filled 2022-01-01: qty 5

## 2022-01-01 MED ORDER — 0.9 % SODIUM CHLORIDE (POUR BTL) OPTIME
TOPICAL | Status: DC | PRN
  Administered 2022-01-01: 1000 mL

## 2022-01-01 MED ORDER — CEFAZOLIN SODIUM-DEXTROSE 2-4 GM/100ML-% IV SOLN
2.0000 g | Freq: Once | INTRAVENOUS | Status: AC
  Administered 2022-01-01: 2 g via INTRAVENOUS
  Filled 2022-01-01: qty 100

## 2022-01-01 MED ORDER — BUPIVACAINE HCL 0.25 % IJ SOLN
INTRAMUSCULAR | Status: AC
  Filled 2022-01-01: qty 1

## 2022-01-01 MED ORDER — DEXAMETHASONE SODIUM PHOSPHATE 4 MG/ML IJ SOLN
INTRAMUSCULAR | Status: DC | PRN
  Administered 2022-01-01: 4 mg via INTRAVENOUS

## 2022-01-01 MED ORDER — ONDANSETRON HCL 4 MG/2ML IJ SOLN
INTRAMUSCULAR | Status: DC | PRN
  Administered 2022-01-01: 4 mg via INTRAVENOUS

## 2022-01-01 MED ORDER — PROPOFOL 10 MG/ML IV BOLUS
INTRAVENOUS | Status: AC
  Filled 2022-01-01: qty 20

## 2022-01-01 MED ORDER — OXYCODONE HCL 5 MG PO TABS
5.0000 mg | ORAL_TABLET | Freq: Once | ORAL | Status: AC | PRN
  Administered 2022-01-01: 5 mg via ORAL

## 2022-01-01 MED ORDER — TRAMADOL HCL 50 MG PO TABS: 50.0000 mg | ORAL_TABLET | Freq: Four times a day (QID) | ORAL | 0 refills | Status: DC | PRN

## 2022-01-01 MED ORDER — LIDOCAINE HCL (PF) 2 % IJ SOLN
INTRAMUSCULAR | Status: AC
  Filled 2022-01-01: qty 15

## 2022-01-01 MED ORDER — FENTANYL CITRATE PF 50 MCG/ML IJ SOSY: 25.0000 ug | PREFILLED_SYRINGE | INTRAMUSCULAR | Status: DC | PRN

## 2022-01-01 MED ORDER — OXYCODONE HCL 5 MG/5ML PO SOLN: 5.0000 mg | Freq: Once | ORAL | Status: AC | PRN

## 2022-01-01 MED ORDER — OXYCODONE HCL 5 MG PO TABS
ORAL_TABLET | ORAL | Status: AC
  Filled 2022-01-01: qty 1

## 2022-01-01 MED ORDER — FENTANYL CITRATE (PF) 100 MCG/2ML IJ SOLN
INTRAMUSCULAR | Status: AC
  Filled 2022-01-01: qty 2

## 2022-01-01 MED ORDER — BUPIVACAINE HCL (PF) 0.25 % IJ SOLN
INTRAMUSCULAR | Status: DC | PRN
  Administered 2022-01-01: 10 mL

## 2022-01-01 MED ORDER — LACTATED RINGERS IV SOLN: INTRAVENOUS | Status: DC

## 2022-01-01 MED ORDER — MIDAZOLAM HCL 2 MG/2ML IJ SOLN
INTRAMUSCULAR | Status: AC
  Filled 2022-01-01: qty 2

## 2022-01-01 MED ORDER — KETOROLAC TROMETHAMINE 30 MG/ML IJ SOLN
30.0000 mg | Freq: Once | INTRAMUSCULAR | Status: AC | PRN
  Administered 2022-01-01: 30 mg via INTRAVENOUS

## 2022-01-01 SURGICAL SUPPLY — 32 items
ADH SKN CLS APL DERMABOND .7 (GAUZE/BANDAGES/DRESSINGS) ×1
BAG COUNTER SPONGE SURGICOUNT (BAG) IMPLANT
BAG SPNG CNTER NS LX DISP (BAG)
BNDG GAUZE ELAST 4 BULKY (GAUZE/BANDAGES/DRESSINGS) ×2 IMPLANT
COVER SURGICAL LIGHT HANDLE (MISCELLANEOUS) ×2 IMPLANT
DERMABOND ADVANCED (GAUZE/BANDAGES/DRESSINGS) ×1
DERMABOND ADVANCED .7 DNX12 (GAUZE/BANDAGES/DRESSINGS) IMPLANT
DRAPE LAPAROTOMY T 98X78 PEDS (DRAPES) ×2 IMPLANT
ELECT NDL TIP 2.8 STRL (NEEDLE) ×1 IMPLANT
ELECT NEEDLE TIP 2.8 STRL (NEEDLE) ×2 IMPLANT
ELECT REM PT RETURN 15FT ADLT (MISCELLANEOUS) ×2 IMPLANT
GLOVE SURG LX 7.5 STRW (GLOVE) ×1
GLOVE SURG LX STRL 7.5 STRW (GLOVE) ×1 IMPLANT
KIT BASIN OR (CUSTOM PROCEDURE TRAY) ×2 IMPLANT
KIT TURNOVER KIT A (KITS) ×1 IMPLANT
NEEDLE HYPO 22GX1.5 SAFETY (NEEDLE) ×1 IMPLANT
NS IRRIG 1000ML POUR BTL (IV SOLUTION) ×1 IMPLANT
PACK GENERAL/GYN (CUSTOM PROCEDURE TRAY) ×2 IMPLANT
PANTS MESH DISP LRG (UNDERPADS AND DIAPERS) IMPLANT
PANTS MESH DISPOSABLE L (UNDERPADS AND DIAPERS)
SPIKE FLUID TRANSFER (MISCELLANEOUS) ×1 IMPLANT
SUPPORT SCROTAL LG STRP (MISCELLANEOUS) ×2 IMPLANT
SUT CHROMIC 3 0 SH 27 (SUTURE) ×4 IMPLANT
SUT MNCRL AB 4-0 PS2 18 (SUTURE) ×1 IMPLANT
SUT PROLENE 4 0 RB 1 (SUTURE)
SUT PROLENE 4-0 RB1 .5 CRCL 36 (SUTURE) IMPLANT
SUT VIC AB 2-0 UR5 27 (SUTURE) IMPLANT
SUT VICRYL 0 TIES 12 18 (SUTURE) IMPLANT
SYR CONTROL 10ML LL (SYRINGE) IMPLANT
TOWEL OR 17X26 10 PK STRL BLUE (TOWEL DISPOSABLE) ×3 IMPLANT
TOWEL OR NON WOVEN STRL DISP B (DISPOSABLE) ×2 IMPLANT
WATER STERILE IRR 1000ML POUR (IV SOLUTION) ×1 IMPLANT

## 2022-01-01 NOTE — Interval H&P Note (Signed)
History and Physical Interval Note: ? ?01/01/2022 ?6:56 AM ? ?Douglas Ann.  has presented today for surgery, with the diagnosis of LEFT HYDROCELE.  The various methods of treatment have been discussed with the patient and family. After consideration of risks, benefits and other options for treatment, the patient has consented to  Procedure(s) with comments: ?HYDROCELECTOMY ADULT (Left) - ONLY NEEDS 60 MIN as a surgical intervention.  The patient's history has been reviewed, patient examined, no change in status, stable for surgery.  I have reviewed the patient's chart and labs.  Questions were answered to the patient's satisfaction.   ? ? ?Douglas Robertson ? ? ?

## 2022-01-01 NOTE — Anesthesia Procedure Notes (Signed)
Procedure Name: LMA Insertion ?Date/Time: 01/01/2022 8:35 AM ?Performed by: Lavina Hamman, CRNA ?Pre-anesthesia Checklist: Patient identified, Emergency Drugs available, Suction available and Patient being monitored ?Patient Re-evaluated:Patient Re-evaluated prior to induction ?Oxygen Delivery Method: Circle System Utilized ?Preoxygenation: Pre-oxygenation with 100% oxygen ?Induction Type: IV induction ?Ventilation: Mask ventilation without difficulty ?LMA: LMA inserted ?LMA Size: 4.0 ?Number of attempts: 1 ?Airway Equipment and Method: Bite block ?Placement Confirmation: positive ETCO2 ?Tube secured with: Tape ?Dental Injury: Teeth and Oropharynx as per pre-operative assessment  ? ? ? ? ?

## 2022-01-01 NOTE — Op Note (Signed)
Preoperative diagnosis: Left hydrocele ? ?Postoperative diagnosis: Left hydrocele ? ?Procedure: Repair of left hydrocele ? ?Surgeon: Pryor Curia MD ? ?Anesthesia: General ? ?Complications: None ? ?EBL: Minimal ? ?Specimens: None ? ?Indication: Mr. Lyerly is a 56 year old gentleman who developed a symptomatic left-sided hydrocele.  After reviewing options for management, he elected surgical repair.  The potential risks, complications, and expected recovery process were discussed in detail.  Informed consent was obtained. ? ?Description of procedure: The patient was taken the operating room and a general anesthetic was administered.  He was given preoperative antibiotics, placed in the supine position, and prepped and draped in the usual sterile fashion.  Next, a preoperative timeout was performed. ? ?The scrotum was examined and an incision was made in the midline scrotal raphae.  This was carried down through the subcutaneous dartos tissue and the left hemiscrotum was able to be delivered out of the incision and away from the surrounding dartos tissues.  The tunica vaginalis was carefully entered and approximately 100 cc of grossly clear hydrocele fluid was removed.  The tunica was then opened and the testis was examined and appeared normal.  The epididymis also appeared normal.  The tunica was then turned back on itself on the opposite side of the testis and sewn to itself with a running 3-0 chromic suture to obliterate this space.  The scrotum was then carefully examined and hemostasis was ensured.  The dartos fascia was then closed with a running 3-0 chromic suture.  The skin was reapproximated with a running 4-0 Monocryl vertical mattress suture.  Dermabond was applied to the skin.  A fluff dressing was applied.  The patient tolerated the procedure well without complications pretty was able to be awakened and transferred to recovery unit in satisfactory condition. ?

## 2022-01-01 NOTE — Transfer of Care (Signed)
Immediate Anesthesia Transfer of Care Note  Patient: Douglas Robertson.  Procedure(s) Performed: Procedure(s) with comments: HYDROCELECTOMY ADULT (Left) - ONLY NEEDS 22 MIN  Patient Location: PACU  Anesthesia Type:General  Level of Consciousness:  sedated, patient cooperative and responds to stimulation  Airway & Oxygen Therapy:Patient Spontanous Breathing and Patient connected to face mask oxgen  Post-op Assessment:  Report given to PACU RN and Post -op Vital signs reviewed and stable  Post vital signs:  Reviewed and stable  Last Vitals:  Vitals:   01/01/22 0535 01/01/22 0915  BP: 122/79 (!) 94/58  Pulse: 74 77  Resp: 18 15  Temp: 36.9 C (!) 36.4 C  SpO2: 43% 601%    Complications: No apparent anesthesia complications

## 2022-01-01 NOTE — Discharge Instructions (Addendum)
1.  Apply ice pack to the scrotum intermittently over the first 72 hours. ?2.  Keep fluff dressing in place is much as possible for the first 48 hours. ?3.  Limit activity including walking for the first week is much as possible. ?4.  Avoid lifting more than 10 to 15 pounds until your follow-up appointment. ?5.  Avoid sexual activity until your follow-up appointment. ?6.  Wait at least 36 to 48 hours before showering. ? ? ?

## 2022-01-01 NOTE — Anesthesia Postprocedure Evaluation (Signed)
Anesthesia Post Note ? ?Patient: Douglas Robertson. ? ?Procedure(s) Performed: HYDROCELECTOMY ADULT (Left: Scrotum) ? ?  ? ?Patient location during evaluation: PACU ?Anesthesia Type: General ?Level of consciousness: awake and alert ?Pain management: pain level controlled ?Vital Signs Assessment: post-procedure vital signs reviewed and stable ?Respiratory status: spontaneous breathing, nonlabored ventilation, respiratory function stable and patient connected to nasal cannula oxygen ?Cardiovascular status: blood pressure returned to baseline and stable ?Postop Assessment: no apparent nausea or vomiting ?Anesthetic complications: no ? ? ?No notable events documented. ? ?Last Vitals:  ?Vitals:  ? 01/01/22 0945 01/01/22 1003  ?BP: (!) 106/58 125/74  ?Pulse: 80 73  ?Resp: 15 16  ?Temp:  36.4 ?C  ?SpO2: 94% 95%  ?  ?Last Pain:  ?Vitals:  ? 01/01/22 1023  ?TempSrc:   ?PainSc: 5   ? ? ?  ?  ?  ?  ?  ?  ? ?[name] ? ? ? ? ?

## 2022-01-02 ENCOUNTER — Encounter (HOSPITAL_COMMUNITY): Payer: Self-pay | Admitting: Urology

## 2022-01-27 ENCOUNTER — Ambulatory Visit: Payer: 59 | Admitting: Podiatry

## 2022-01-28 ENCOUNTER — Ambulatory Visit (INDEPENDENT_AMBULATORY_CARE_PROVIDER_SITE_OTHER): Payer: 59

## 2022-01-28 DIAGNOSIS — Z91038 Other insect allergy status: Secondary | ICD-10-CM

## 2022-01-28 MED ORDER — EPINEPHRINE 0.3 MG/0.3ML IJ SOAJ: 0.3000 mg | INTRAMUSCULAR | 2 refills | Status: DC | PRN

## 2022-01-30 ENCOUNTER — Ambulatory Visit: Payer: 59

## 2022-01-30 ENCOUNTER — Ambulatory Visit: Payer: 59 | Admitting: Allergy & Immunology

## 2022-02-03 ENCOUNTER — Ambulatory Visit: Payer: 59 | Admitting: Nutrition

## 2022-02-11 ENCOUNTER — Other Ambulatory Visit: Payer: Self-pay | Admitting: Urology

## 2022-02-18 NOTE — Progress Notes (Addendum)
Anesthesia Review:  PCP: DR Kern [facility]  Cardiologist : none  Chest x-ray : EKG :02/23/22  Echo : Stress test: 2017  Cardiac Cath :  Activity level:  can do a  flight of stairs without difficulty  Sleep Study/ CPAP : none  Fasting Blood Sugar :      / Checks Blood Sugar -- times a day:   Blood Thinner/ Instructions /Last Dose: ASA / Instructions/ Last Dose :   01/01/22- hydrocelectomy  DM- type 2 Checks glucose 1-2 times per week  Hgba1c- 02/23/22-

## 2022-02-18 NOTE — Progress Notes (Signed)
DUE TO COVID-19 ONLY  2  VISITOR IS ALLOWED TO COME WITH YOU AND STAY IN THE WAITING ROOM ONLY DURING PRE OP AND PROCEDURE DAY OF SURGERY.  4  VISITOR  MAY VISIT WITH YOU AFTER SURGERY IN YOUR PRIVATE ROOM DURING VISITING HOURS ONLY! YOU MAY HAVE ONE PERSON SPEND THE NITE WITH YOU IN YOUR ROOM AFTER SURGERY.       Your procedure is scheduled on:             02/26/22   Report to [facility] Main  Entrance   Report to admitting at    1000              AM DO NOT BRING INSURANCE CARD, PICTURE ID OR WALLET DAY OF SURGERY.      Call this number if you have problems the morning of surgery [phone]    REMEMBER: NO  SOLID FOODS , CANDY, GUM OR MINTS AFTER [place] .       Marland Kitchen CLEAR LIQUIDS UNTIL    0915am             DAY OF SURGERY.       CLEAR LIQUID DIET   Foods Allowed      WATER BLACK COFFEE ( SUGAR OK, NO MILK, CREAM OR CREAMER) REGULAR AND DECAF  TEA ( SUGAR OK NO MILK, CREAM, OR CREAMER) REGULAR AND DECAF  PLAIN JELLO ( NO RED)  FRUIT ICES ( NO RED, NO FRUIT PULP)  POPSICLES ( NO RED)  JUICE- APPLE, WHITE GRAPE AND WHITE CRANBERRY  SPORT DRINK LIKE GATORADE ( NO RED)  CLEAR BROTH ( VEGETABLE , CHICKEN OR BEEF)                                                                     BRUSH YOUR TEETH MORNING OF SURGERY AND RINSE YOUR MOUTH OUT, NO CHEWING GUM CANDY OR MINTS.     Take these medicines the morning of surgery with A SIP OF WATER:  none    DO NOT TAKE ANY DIABETIC MEDICATIONS DAY OF YOUR SURGERY                               You may not have any metal on your body including hair pins and              piercings  Do not wear jewelry, make-up, lotions, powders or perfumes, deodorant             Do not wear nail polish on your fingernails.              IF YOU ARE A MALE AND WANT TO SHAVE UNDER ARMS OR LEGS PRIOR TO SURGERY YOU MUST DO SO AT LEAST 48 HOURS PRIOR TO SURGERY.              Men may shave face and neck.   Do not bring  valuables to the hospital. [place].  Contacts, dentures or bridgework may not be worn into surgery.  Leave suitcase in the car.  After surgery it may be brought to your room.     Patients discharged the day of surgery will not be allowed to drive home. IF YOU ARE HAVING SURGERY AND GOING HOME THE SAME DAY, YOU MUST HAVE AN ADULT TO DRIVE YOU HOME AND BE WITH YOU FOR 24 HOURS. YOU MAY GO HOME BY TAXI OR UBER OR ORTHERWISE, BUT AN ADULT MUST ACCOMPANY YOU HOME AND STAY WITH YOU FOR 24 HOURS.                Please read over the following fact sheets you were given: _____________________________________________________________________  [facility] - Preparing for Surgery Before surgery, you can play an important role.  Because skin is not sterile, your skin needs to be as free of germs as possible.  You can reduce the number of germs on your skin by washing with CHG (chlorahexidine gluconate) soap before surgery.  CHG is an antiseptic cleaner which kills germs and bonds with the skin to continue killing germs even after washing. Please DO NOT use if you have an allergy to CHG or antibacterial soaps.  If your skin becomes reddened/irritated stop using the CHG and inform your nurse when you arrive at Short Stay. Do not shave (including legs and underarms) for at least 48 hours prior to the first CHG shower.  You may shave your face/neck. Please follow these instructions carefully:  1.  Shower with CHG Soap the night before surgery and the  morning of Surgery.  2.  If you choose to wash your hair, wash your hair first as usual with your  normal  shampoo.  3.  After you shampoo, rinse your hair and body thoroughly to remove the  shampoo.                           4.  Use CHG as you would any other liquid soap.  You can apply chg directly  to the skin and wash                       Gently with a scrungie or clean washcloth.  5.  Apply the CHG Soap to your  body ONLY FROM THE NECK DOWN.   Do not use on face/ open                           Wound or open sores. Avoid contact with eyes, ears mouth and genitals (private parts).                       Wash face,  Genitals (private parts) with your normal soap.             6.  Wash thoroughly, paying special attention to the area where your surgery  will be performed.  7.  Thoroughly rinse your body with warm water from the neck down.  8.  DO NOT shower/wash with your normal soap after using and rinsing off  the CHG Soap.                9.  Pat yourself dry with a clean towel.            10.  Wear clean pajamas.            11.  Place clean sheets on your bed the night of your first shower and do not  sleep with pets. Day of Surgery : Do not apply any lotions/deodorants the morning of surgery.  Please wear clean clothes to the hospital/surgery center.  FAILURE TO FOLLOW THESE INSTRUCTIONS MAY RESULT IN THE CANCELLATION OF YOUR SURGERY PATIENT SIGNATURE_________________________________  NURSE SIGNATURE__________________________________  ________________________________________________________________________

## 2022-02-19 ENCOUNTER — Ambulatory Visit: Payer: 59 | Admitting: Podiatry

## 2022-02-23 ENCOUNTER — Other Ambulatory Visit: Payer: Self-pay

## 2022-02-23 ENCOUNTER — Encounter (HOSPITAL_COMMUNITY)
Admission: RE | Admit: 2022-02-23 | Discharge: 2022-02-23 | Disposition: A | Discharge status: Home / Self Care | Payer: 59 | Source: Ambulatory Visit | Attending: Urology | Admitting: Urology

## 2022-02-23 ENCOUNTER — Encounter (HOSPITAL_COMMUNITY): Payer: Self-pay

## 2022-02-23 DIAGNOSIS — Z01818 Encounter for other preprocedural examination: Secondary | ICD-10-CM | POA: Insufficient documentation

## 2022-02-23 LAB — CBC
HCT: 46.5 % (ref 39.0–52.0)
Hemoglobin: 15.8 g/dL (ref 13.0–17.0)
MCH: 28.8 pg (ref 26.0–34.0)
MCHC: 34 g/dL (ref 30.0–36.0)
MCV: 84.9 fL (ref 80.0–100.0)
Platelets: 231 10*3/uL (ref 150–400)
RBC: 5.48 MIL/uL (ref 4.22–5.81)
RDW: 13.6 % (ref 11.5–15.5)
WBC: 10.8 10*3/uL — ABNORMAL HIGH (ref 4.0–10.5)
nRBC: 0 % (ref 0.0–0.2)

## 2022-02-23 LAB — BASIC METABOLIC PANEL
Anion gap: 10 (ref 5–15)
BUN: 24 mg/dL — ABNORMAL HIGH (ref 6–20)
CO2: 24 mmol/L (ref 22–32)
Calcium: 10.2 mg/dL (ref 8.9–10.3)
Chloride: 103 mmol/L (ref 98–111)
Creatinine, Ser: 1.4 mg/dL — ABNORMAL HIGH (ref 0.61–1.24)
GFR, Estimated: 59 mL/min — ABNORMAL LOW (ref >60)
Glucose, Bld: 105 mg/dL — ABNORMAL HIGH (ref 70–99)
Potassium: 3.9 mmol/L (ref 3.5–5.1)
Sodium: 137 mmol/L (ref 135–145)

## 2022-02-23 LAB — HEMOGLOBIN A1C
Hgb A1c MFr Bld: 6.4 % — ABNORMAL HIGH (ref 4.8–5.6)
Mean Plasma Glucose: 136.98 mg/dL

## 2022-02-23 LAB — GLUCOSE, CAPILLARY: Glucose-Capillary: 129 mg/dL — ABNORMAL HIGH (ref 70–99)

## 2022-02-25 ENCOUNTER — Ambulatory Visit (INDEPENDENT_AMBULATORY_CARE_PROVIDER_SITE_OTHER): Payer: 59

## 2022-02-25 DIAGNOSIS — Z91038 Other insect allergy status: Secondary | ICD-10-CM

## 2022-02-25 NOTE — H&P (Signed)
Office Visit Report     02/11/2022   --------------------------------------------------------------------------------   Douglas Robertson  MRN: [id number]  DOB: [birth year], 56 year old Male  SSN: [id number]   PRIMARY CARE:  Mitzie Na. Quillian Quince, MD  REFERRING:  Wonda Cheng. Williams Che,   PROVIDER:  Raynelle Bring, M.D.  LOCATION:  [facility] [phone]     --------------------------------------------------------------------------------   CC/HPI: Left hydrocele   Douglas Robertson returns today and is now approximately 6 weeks out from his left hydrocele repair. He had developed recurrent swelling in his left hemiscrotum. This is not significantly improved over the past 3 weeks unfortunately.     ALLERGIES: No Allergies    MEDICATIONS: Lipitor 20 mg tablet  Metformin Hcl 500 mg tablet  Valsartan     GU PSH: Locm 300-'399Mg'$ /Ml Iodine,1Ml - 12/10/2020, 2021, 2020, 2019, 2019, 2018, 2018, 2017 Radical nephrectomy (laparoscopic) - 2017 Repair Hydrocele - 01/01/2022       PSH Notes: Kidney Surgery Laparoscopic Radical Nephrectomy, Knee Surgery   NON-GU PSH: Shoulder Surgery (Unspecified), Left     GU PMH: Hydrocele - 01/21/2022, - 01/13/2022, - 11/06/2021, - 12/18/2020, - 2020 History of kidney cancer - 12/18/2020, - 12/10/2020 (Stable), - 2018 Hydrocele, Unspec - 2017 Renal cell carcinoma, left, Renal cell carcinoma, left - 2017 Gross hematuria, Gross hematuria - 2017 Left renal neoplasm, Neoplasm of left kidney - 2017 Testicular pain, unspecified, Testicular pain - 2015 ED due to arterial insufficiency, Erectile dysfunction due to arterial insufficiency - 2014 Epididymitis, Epididymitis - 2014      PMH Notes:   1) Renal cell carcinoma: He presented with gross hematuria in January 2017 and was found to have a large left enhancing renal mass. He underwent treatment with a left laparoscopic radical nephrectomy on 10/10/15 for a 9.0 cm left renal mass. He was noted to have an  incidental small pulmonary nodule during his staging evaluation that was non-specific.   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   3) Erectile dysfunction: He has mild erectile dysfunction which has not required treatment.   Baseline SHIM: 23   4) Left hydrocele: He is s/p left hydrocele repair in April 2023.     NON-GU PMH: Postprocedural hematoma of a genitourinary system organ or structure following a genitourinary system procedure - 01/13/2022 Diabetes Type 2 Hypertension    FAMILY HISTORY: Family Health Status - Father alive at age 58 - 36 In Family Family Health Status - Mother's Age - Runs In Family Family Health Status Children _4__ Living Daughter - Runs In Family Heart Disease - Father Hematuria - Father   SOCIAL HISTORY: Marital Status: Married Preferred Language: English; Ethnicity: Not Hispanic Or Latino; Race: White Current Smoking Status: Patient has never smoked.  Has never drank.  Does not drink caffeine. Patient's occupation Publishing copy.     Notes: Former smoker, Alcohol Use, Occupation:, Marital History - Currently Married   REVIEW OF SYSTEMS:    GU Review Male:   Patient denies frequent urination, hard to postpone urination, burning/ pain with urination, get up at night to urinate, leakage of urine, stream starts and stops, trouble starting your streams, and have to strain to urinate .  Gastrointestinal (Lower):   Patient denies diarrhea and constipation.  Gastrointestinal (Upper):   Patient denies nausea and  vomiting.  Constitutional:   Patient denies fatigue, fever, weight loss, and night sweats.  Skin:   Patient denies skin rash/ lesion and itching.  Eyes:   Patient denies blurred vision and double vision.  Ears/ Nose/ Throat:   Patient denies sore throat and sinus  problems.  Hematologic/Lymphatic:   Patient denies swollen glands and easy bruising.  Cardiovascular:   Patient denies leg swelling and chest pains.  Respiratory:   Patient denies cough and shortness of breath.  Endocrine:   Patient denies excessive thirst.  Musculoskeletal:   Patient denies back pain and joint pain.  Neurological:   Patient denies headaches and dizziness.  Psychologic:   Patient denies depression and anxiety.   VITAL SIGNS:      02/11/2022 10:56 AM  Weight 210 lb / 95.25 kg  Height 67 in / 170.18 cm  BP 115/73 mmHg  Pulse 75 /min  Temperature 97.8 F / 36.5 C  BMI 32.9 kg/m   GU PHYSICAL EXAMINATION:      Notes: His exam is stable and unchanged from 3 weeks ago. It is most consistent with a probable recurrent left hydrocele. This remains fairly tense. His incision is well-healed.   MULTI-SYSTEM PHYSICAL EXAMINATION:    Constitutional: Well-nourished. No physical deformities. Normally developed. Good grooming.     Complexity of Data:  Records Review:   Previous Patient Records  X-Ray Review: Scrotal Ultrasound: Reviewed Films.     12/12/19  PSA  Total PSA 0.7 ng/dl   Notes:                     I independently reviewed his scrotal ultrasound. This is consistent with a normal left testis with a large left-sided hydrocele. There are some complicated features including some debris in septations. He does have an epididymal cyst.   PROCEDURES:         Scrotal Ultrasound - 30160  Right Testicle: Length: 4.6 cm  Height: 1.5 cm  Width: 2.3 cm  Left Testicle: Length: 3.5 cm  Height: 2.3 cm  Width: 2.6 cm  Left Testis/Epididymis:  Large left hydrocele with septations and debris vs blood. Cystic area within epididymal head. ?dilated ducts  Right Testis/Epididymis:  Within Normal Limits       Patient confirmed No Neulasta OnPro Device.           Urinalysis Dipstick Dipstick Cont'd  Color: Yellow Bilirubin: Neg mg/dL  Appearance: Clear Ketones: Neg mg/dL   Specific Gravity: 1.015 Blood: Neg ery/uL  pH: 6.0 Protein: Trace mg/dL  Glucose: Neg mg/dL Urobilinogen: 0.2 mg/dL    Nitrites: Neg    Leukocyte Esterase: Neg leu/uL         Notes:   After reviewing options, we agreed to proceed with an attempt at percutaneous aspiration prior to repeat surgical exploration. We reviewed the potential risks and complications of this procedure and he gave informed consent to proceed.   His skin was prepped with Betadine and he was anesthetized with 1% lidocaine over the skin of the anterior scrotum. Using ultrasound guidance, a 14 French angiocatheter was inserted through the anterior scrotal wall into the fluid collection under direct vision. Approximately 300 cc of red-tinged serous fluid was returned. Repeat ultrasound demonstrated no residual hydrocele fluid. The patient tolerated the procedure well without complications.   ASSESSMENT:      ICD-10 Details  1 GU:   Hydrocele - N43.0    PLAN:           Orders  X-Rays: Scrotal Ultrasound          Schedule Return Visit/Planned Activity: Other See Visit Notes             Note: Will call to tentatively schedule surgery. Patient to call and update me on his progress next week.          Document Letter(s):  Created for Patient: Clinical Summary         Notes:   1. Recurrent left hydrocele status post hydrocele repair 6 weeks ago: He appeared to have a postoperative hematoma with subsequent hydrocele recurrence. This was percutaneously drained today. We agreed to tentatively schedule him for operative exploration in a few weeks with the idea that if he does not develop a recurrent hydrocele, we will cancel this procedure. He will update me toward the end of next week on his progress. If we cancel surgery, he will simply keep his scheduled postoperative appointment for follow-up.   CC: Dr. Gar Ponto        Next Appointment:      Next Appointment: 02/26/2022 12:15 PM    Appointment Type: Surgery      Location: [facility] [phone]    Provider: Raynelle Bring, M.D.    Reason for Visit: WL/OP LEFT HYDROCELE DRAINAGE AND REPAIR      * Signed by Raynelle Bring, M.D. on 02/11/22 at 5:05 PM (EDT)*     APPENDED NOTES:    We spoke on 6/20 and he has had about a 50% -75% recurrence of hydrocele fluid. He will proceed with repeat surgical repair/drainage.    * Signed by Raynelle Bring, M.D. on 02/25/22 at 9:19 AM (EDT)*

## 2022-02-26 ENCOUNTER — Ambulatory Visit (HOSPITAL_COMMUNITY)
Admission: RE | Admit: 2022-02-26 | Discharge: 2022-02-26 | Disposition: A | Discharge status: Home / Self Care | Payer: 59 | Attending: Urology | Admitting: Urology

## 2022-02-26 ENCOUNTER — Ambulatory Visit (HOSPITAL_COMMUNITY): Payer: 59 | Admitting: Physician Assistant

## 2022-02-26 ENCOUNTER — Other Ambulatory Visit: Payer: Self-pay

## 2022-02-26 ENCOUNTER — Ambulatory Visit (HOSPITAL_BASED_OUTPATIENT_CLINIC_OR_DEPARTMENT_OTHER): Payer: 59 | Admitting: Anesthesiology

## 2022-02-26 ENCOUNTER — Encounter (HOSPITAL_COMMUNITY): Payer: Self-pay | Admitting: Urology

## 2022-02-26 ENCOUNTER — Encounter (HOSPITAL_COMMUNITY)
Admission: RE | Disposition: A | Discharge status: Home / Self Care | Payer: Self-pay | Source: Home / Self Care | Attending: Urology

## 2022-02-26 DIAGNOSIS — I10 Essential (primary) hypertension: Secondary | ICD-10-CM | POA: Diagnosis not present

## 2022-02-26 DIAGNOSIS — Z905 Acquired absence of kidney: Secondary | ICD-10-CM | POA: Diagnosis not present

## 2022-02-26 DIAGNOSIS — Z01818 Encounter for other preprocedural examination: Secondary | ICD-10-CM

## 2022-02-26 DIAGNOSIS — Z85528 Personal history of other malignant neoplasm of kidney: Secondary | ICD-10-CM | POA: Diagnosis not present

## 2022-02-26 DIAGNOSIS — E119 Type 2 diabetes mellitus without complications: Secondary | ICD-10-CM | POA: Insufficient documentation

## 2022-02-26 DIAGNOSIS — N433 Hydrocele, unspecified: Secondary | ICD-10-CM

## 2022-02-26 DIAGNOSIS — Z7984 Long term (current) use of oral hypoglycemic drugs: Secondary | ICD-10-CM | POA: Insufficient documentation

## 2022-02-26 HISTORY — PX: HYDROCELE EXCISION: SHX482

## 2022-02-26 LAB — GLUCOSE, CAPILLARY: Glucose-Capillary: 125 mg/dL — ABNORMAL HIGH (ref 70–99)

## 2022-02-26 SURGERY — HYDROCELECTOMY
Anesthesia: General

## 2022-02-26 MED ORDER — BUPIVACAINE HCL (PF) 0.25 % IJ SOLN
INTRAMUSCULAR | Status: AC
Start: 2022-02-26 — End: ?
  Filled 2022-02-26: qty 30

## 2022-02-26 MED ORDER — DEXAMETHASONE SODIUM PHOSPHATE 10 MG/ML IJ SOLN
INTRAMUSCULAR | Status: AC
  Filled 2022-02-26: qty 1

## 2022-02-26 MED ORDER — PROPOFOL 10 MG/ML IV BOLUS
INTRAVENOUS | Status: DC | PRN
  Administered 2022-02-26: 200 mg via INTRAVENOUS

## 2022-02-26 MED ORDER — ONDANSETRON HCL 4 MG/2ML IJ SOLN
INTRAMUSCULAR | Status: DC | PRN
  Administered 2022-02-26: 4 mg via INTRAVENOUS

## 2022-02-26 MED ORDER — CHLORHEXIDINE GLUCONATE 0.12 % MT SOLN
15.0000 mL | Freq: Once | OROMUCOSAL | Status: AC
  Administered 2022-02-26: 15 mL via OROMUCOSAL

## 2022-02-26 MED ORDER — FENTANYL CITRATE PF 50 MCG/ML IJ SOSY
PREFILLED_SYRINGE | INTRAMUSCULAR | Status: AC
  Filled 2022-02-26: qty 1

## 2022-02-26 MED ORDER — LIDOCAINE 2% (20 MG/ML) 5 ML SYRINGE
INTRAMUSCULAR | Status: DC | PRN
  Administered 2022-02-26: 60 mg via INTRAVENOUS

## 2022-02-26 MED ORDER — OXYCODONE HCL 5 MG PO TABS
5.0000 mg | ORAL_TABLET | Freq: Once | ORAL | Status: AC
  Administered 2022-02-26: 5 mg via ORAL

## 2022-02-26 MED ORDER — ONDANSETRON HCL 4 MG/2ML IJ SOLN
INTRAMUSCULAR | Status: AC
  Filled 2022-02-26: qty 2

## 2022-02-26 MED ORDER — DEXAMETHASONE SODIUM PHOSPHATE 10 MG/ML IJ SOLN
INTRAMUSCULAR | Status: DC | PRN
  Administered 2022-02-26: 5 mg via INTRAVENOUS

## 2022-02-26 MED ORDER — OXYCODONE HCL 5 MG PO TABS
ORAL_TABLET | ORAL | Status: AC
  Filled 2022-02-26: qty 1

## 2022-02-26 MED ORDER — TRAMADOL HCL 50 MG PO TABS: 50.0000 mg | ORAL_TABLET | Freq: Four times a day (QID) | ORAL | 0 refills | Status: DC | PRN

## 2022-02-26 MED ORDER — CEFAZOLIN SODIUM-DEXTROSE 2-4 GM/100ML-% IV SOLN
2.0000 g | Freq: Once | INTRAVENOUS | Status: AC
  Administered 2022-02-26: 2 g via INTRAVENOUS
  Filled 2022-02-26: qty 100

## 2022-02-26 MED ORDER — FENTANYL CITRATE PF 50 MCG/ML IJ SOSY
25.0000 ug | PREFILLED_SYRINGE | INTRAMUSCULAR | Status: DC | PRN
  Administered 2022-02-26 (×2): 50 ug via INTRAVENOUS

## 2022-02-26 MED ORDER — ACETAMINOPHEN 500 MG PO TABS: 1000.0000 mg | ORAL_TABLET | Freq: Once | ORAL | Status: DC

## 2022-02-26 MED ORDER — PHENYLEPHRINE 80 MCG/ML (10ML) SYRINGE FOR IV PUSH (FOR BLOOD PRESSURE SUPPORT)
PREFILLED_SYRINGE | INTRAVENOUS | Status: DC | PRN
  Administered 2022-02-26: 160 ug via INTRAVENOUS

## 2022-02-26 MED ORDER — DEXMEDETOMIDINE HCL IN NACL 200 MCG/50ML IV SOLN
INTRAVENOUS | Status: DC | PRN
  Administered 2022-02-26 (×2): 8 ug via INTRAVENOUS

## 2022-02-26 MED ORDER — ORAL CARE MOUTH RINSE: 15.0000 mL | Freq: Once | OROMUCOSAL | Status: AC

## 2022-02-26 MED ORDER — FENTANYL CITRATE (PF) 100 MCG/2ML IJ SOLN
INTRAMUSCULAR | Status: AC
  Filled 2022-02-26: qty 2

## 2022-02-26 MED ORDER — BUPIVACAINE HCL (PF) 0.25 % IJ SOLN
INTRAMUSCULAR | Status: DC | PRN
  Administered 2022-02-26: 10 mL

## 2022-02-26 MED ORDER — PHENYLEPHRINE 80 MCG/ML (10ML) SYRINGE FOR IV PUSH (FOR BLOOD PRESSURE SUPPORT)
PREFILLED_SYRINGE | INTRAVENOUS | Status: AC
  Filled 2022-02-26: qty 10

## 2022-02-26 MED ORDER — MIDAZOLAM HCL 2 MG/2ML IJ SOLN
INTRAMUSCULAR | Status: AC
  Filled 2022-02-26: qty 2

## 2022-02-26 MED ORDER — LACTATED RINGERS IV SOLN: INTRAVENOUS | Status: DC

## 2022-02-26 MED ORDER — DEXMEDETOMIDINE HCL IN NACL 80 MCG/20ML IV SOLN
INTRAVENOUS | Status: AC
  Filled 2022-02-26: qty 20

## 2022-02-26 MED ORDER — 0.9 % SODIUM CHLORIDE (POUR BTL) OPTIME
TOPICAL | Status: DC | PRN
  Administered 2022-02-26: 1000 mL

## 2022-02-26 MED ORDER — FENTANYL CITRATE (PF) 100 MCG/2ML IJ SOLN
INTRAMUSCULAR | Status: DC | PRN
  Administered 2022-02-26 (×4): 25 ug via INTRAVENOUS

## 2022-02-26 MED ORDER — PROPOFOL 10 MG/ML IV BOLUS
INTRAVENOUS | Status: AC
  Filled 2022-02-26: qty 20

## 2022-02-26 MED ORDER — MIDAZOLAM HCL 5 MG/5ML IJ SOLN
INTRAMUSCULAR | Status: DC | PRN
  Administered 2022-02-26: 2 mg via INTRAVENOUS

## 2022-02-26 SURGICAL SUPPLY — 37 items
ADH SKN CLS APL DERMABOND .7 (GAUZE/BANDAGES/DRESSINGS) ×1
BAG COUNTER SPONGE SURGICOUNT (BAG) IMPLANT
BAG SPNG CNTER NS LX DISP (BAG)
BNDG GAUZE DERMACEA FLUFF (GAUZE/BANDAGES/DRESSINGS) ×1
BNDG GAUZE DERMACEA FLUFF 4 (GAUZE/BANDAGES/DRESSINGS) ×1 IMPLANT
BNDG GZE DERMACEA 4 6PLY (GAUZE/BANDAGES/DRESSINGS) ×1
COVER SURGICAL LIGHT HANDLE (MISCELLANEOUS) ×2 IMPLANT
DERMABOND ADVANCED (GAUZE/BANDAGES/DRESSINGS) ×1
DERMABOND ADVANCED .7 DNX12 (GAUZE/BANDAGES/DRESSINGS) IMPLANT
DRAIN PENROSE 0.25X18 (DRAIN) ×1 IMPLANT
DRAPE LAPAROTOMY T 98X78 PEDS (DRAPES) ×2 IMPLANT
ELECT NDL TIP 2.8 STRL (NEEDLE) ×1 IMPLANT
ELECT NEEDLE TIP 2.8 STRL (NEEDLE) ×2 IMPLANT
ELECT REM PT RETURN 15FT ADLT (MISCELLANEOUS) ×2 IMPLANT
GLOVE SURG LX 7.5 STRW (GLOVE) ×1
GLOVE SURG LX STRL 7.5 STRW (GLOVE) ×1 IMPLANT
GOWN STRL REUS W/ TWL XL LVL3 (GOWN DISPOSABLE) ×1 IMPLANT
GOWN STRL REUS W/TWL XL LVL3 (GOWN DISPOSABLE) ×2
KIT BASIN OR (CUSTOM PROCEDURE TRAY) ×2 IMPLANT
KIT TURNOVER KIT A (KITS) IMPLANT
NEEDLE HYPO 22GX1.5 SAFETY (NEEDLE) ×1 IMPLANT
NS IRRIG 1000ML POUR BTL (IV SOLUTION) ×2 IMPLANT
PACK GENERAL/GYN (CUSTOM PROCEDURE TRAY) ×2 IMPLANT
PANTS MESH DISP LRG (UNDERPADS AND DIAPERS) ×1 IMPLANT
SPIKE FLUID TRANSFER (MISCELLANEOUS) ×2 IMPLANT
SUPPORT SCROTAL LG STRP (MISCELLANEOUS) ×2 IMPLANT
SUT CHROMIC 3 0 SH 27 (SUTURE) ×4 IMPLANT
SUT ETHILON 3 0 PS 1 (SUTURE) ×1 IMPLANT
SUT MNCRL AB 4-0 PS2 18 (SUTURE) IMPLANT
SUT PROLENE 4 0 RB 1 (SUTURE)
SUT PROLENE 4-0 RB1 .5 CRCL 36 (SUTURE) IMPLANT
SUT VIC AB 2-0 UR5 27 (SUTURE) IMPLANT
SUT VICRYL 0 TIES 12 18 (SUTURE) IMPLANT
SYR CONTROL 10ML LL (SYRINGE) ×1 IMPLANT
TOWEL OR 17X26 10 PK STRL BLUE (TOWEL DISPOSABLE) ×4 IMPLANT
TOWEL OR NON WOVEN STRL DISP B (DISPOSABLE) ×2 IMPLANT
WATER STERILE IRR 1000ML POUR (IV SOLUTION) ×2 IMPLANT

## 2022-02-26 NOTE — Anesthesia Procedure Notes (Signed)
Procedure Name: LMA Insertion Date/Time: 02/26/2022 12:58 PM  Performed by: Myna Bright, CRNAPre-anesthesia Checklist: Patient identified, Emergency Drugs available, Suction available and Patient being monitored Patient Re-evaluated:Patient Re-evaluated prior to induction Oxygen Delivery Method: Circle system utilized Preoxygenation: Pre-oxygenation with 100% oxygen Induction Type: IV induction Ventilation: Mask ventilation without difficulty LMA: LMA inserted LMA Size: 4.0 Number of attempts: 1 Placement Confirmation: positive ETCO2 and breath sounds checked- equal and bilateral Tube secured with: Tape Dental Injury: Teeth and Oropharynx as per pre-operative assessment

## 2022-02-26 NOTE — Discharge Instructions (Signed)
1.  Leave dressing in place for up to 48 hours. 2.  Use ice pack to scrotum intermittently is much as possible over the first 2 to 3 days to reduce swelling. 3.  Call if uncontrolled pain or fever.

## 2022-02-26 NOTE — Op Note (Signed)
Preoperative diagnosis: Recurrent left hydrocele  Postoperative diagnosis: Recurrent left hydrocele  Procedure: Left hydrocele repair  Surgeon: Moody Bruins MD  Anesthesia: General  Complications: None  EBL: Minimal  Specimens: None  Drains: Quarter inch Penrose drain from left side of scrotum  Indication: [name] is a 56 year old gentleman who underwent a left hydrocele repair a few months ago.  He developed what was likely a postoperative hematoma with subsequent recurrent hydrocele.  This was aspirated under ultrasound guidance in the office but again recurred to a point where was approximately 50 to 70% of its original size.  After further discussion, he elected to proceed with repeat surgical drainage.  The potential risks, complications, and expected recovery process were discussed in detail.  Informed consent was obtained.  Description of procedure: The patient was taken the operating room and general anesthetic was administered.  He was given preoperative antibiotics, placed supine, and prepped and draped in usual sterile fashion.  A preoperative timeout was performed.  Examination of the scrotum revealed a slightly tense left hemiscrotum consistent with his recurrent hydrocele diagnosis.  His previously noted midline scar was identified and an incision was made sharply with a #15 blade through this area.  The tunica was identified and was carefully opened with approximately 250 cc of serous appearing fluid removed.  The testicle was delivered and the space within the scrotum was identified.  The previously noted hydrocele sac was appropriately sewn in an inverted fashion.  There was not noted to be any area of ongoing fluid drainage from either the peritoneal space or otherwise.  The lining of the space was then cauterized extensively and for hemostatic purposes.  The testis appeared normal without masses or other abnormalities.  1/4 inch Penrose drain was then brought in  through the left side of the scrotum through a separate stab incision and sewn to the skin with a 3-0 nylon suture.  The dartos fascia was then closed with a running 3-0 chromic suture.  0.25% Marcaine was used to inject the incision and the skin was then reapproximated with a 3-0 chromic suture in a running horizontal mattress fashion.  Dermabond was applied to the skin.  A fluff dressing was applied.  The patient tolerated the procedure well without complications.  He was able to be awakened and transferred to the recovery unit in satisfactory condition.

## 2022-02-26 NOTE — Anesthesia Postprocedure Evaluation (Signed)
Anesthesia Post Note  Patient: Denson Niccoli.  Procedure(s) Performed: HYDROCELE DRAINAGE AND REPAIR, LEFT     Patient location during evaluation: PACU Anesthesia Type: General Level of consciousness: awake and alert Pain management: pain level controlled Vital Signs Assessment: post-procedure vital signs reviewed and stable Respiratory status: spontaneous breathing, nonlabored ventilation, respiratory function stable and patient connected to nasal cannula oxygen Cardiovascular status: blood pressure returned to baseline and stable Postop Assessment: no apparent nausea or vomiting Anesthetic complications: no   No notable events documented.  Last Vitals:  Vitals:   02/26/22 1500 02/26/22 1523  BP: 124/74   Pulse: (!) 55   Resp:    Temp:  36.4 C  SpO2: 94%     Last Pain:  Vitals:   02/26/22 1523  TempSrc: Oral  PainSc: 2                  Wendy Mikles L Vanna Shavers

## 2022-02-26 NOTE — Transfer of Care (Signed)
Immediate Anesthesia Transfer of Care Note  Patient: Douglas Robertson.  Procedure(s) Performed: HYDROCELE DRAINAGE AND REPAIR, LEFT  Patient Location: PACU  Anesthesia Type:General  Level of Consciousness: awake, alert , oriented and patient cooperative  Airway & Oxygen Therapy: Patient Spontanous Breathing and Patient connected to face mask oxygen  Post-op Assessment: Report given to RN, Post -op Vital signs reviewed and stable and Patient moving all extremities  Post vital signs: Reviewed and stable  Last Vitals:  Vitals Value Taken Time  BP 118/83 02/26/22 1345  Temp 36.3 C 02/26/22 1344  Pulse 70 02/26/22 1346  Resp 9 02/26/22 1346  SpO2 99 % 02/26/22 1346  Vitals shown include unvalidated device data.  Last Pain:  Vitals:   02/26/22 1024  TempSrc:   PainSc: 0-No pain         Complications: No notable events documented.

## 2022-02-26 NOTE — Anesthesia Preprocedure Evaluation (Addendum)
Anesthesia Evaluation  Patient identified by MRN, date of birth, ID band Patient awake    Reviewed: Allergy & Precautions, NPO status , Patient's Chart, lab work & pertinent test results  Airway Mallampati: III  TM Distance: >3 FB Neck ROM: Full  Mouth opening: Limited Mouth Opening  Dental no notable dental hx. (+) Teeth Intact, Dental Advisory Given   Pulmonary neg pulmonary ROS, former smoker,    Pulmonary exam normal breath sounds clear to auscultation       Cardiovascular hypertension, Pt. on medications Normal cardiovascular exam Rhythm:Regular Rate:Normal     Neuro/Psych negative neurological ROS  negative psych ROS   GI/Hepatic negative GI ROS, Neg liver ROS,   Endo/Other  diabetes, Type 2, Oral Hypoglycemic Agents  Renal/GU Renal InsufficiencyRenal diseaseLab Results      Component                Value               Date                      CREATININE               1.40 (H)            02/23/2022                BUN                      24 (H)              02/23/2022                NA                       137                 02/23/2022                K                        3.9                 02/23/2022                CL                       103                 02/23/2022                CO2                      24                  02/23/2022             negative genitourinary   Musculoskeletal negative musculoskeletal ROS (+)   Abdominal   Peds  Hematology negative hematology ROS (+)   Anesthesia Other Findings   Reproductive/Obstetrics                            Anesthesia Physical Anesthesia Plan  ASA: 2  Anesthesia Plan: General   Post-op Pain Management: Tylenol PO (pre-op)*   Induction: Intravenous  PONV Risk Score and Plan: 2 and Ondansetron, Dexamethasone  and Midazolam  Airway Management Planned: LMA  Additional Equipment:   Intra-op Plan:    Post-operative Plan: Extubation in OR  Informed Consent: I have reviewed the patients History and Physical, chart, labs and discussed the procedure including the risks, benefits and alternatives for the proposed anesthesia with the patient or authorized representative who has indicated his/her understanding and acceptance.     Dental advisory given  Plan Discussed with: CRNA  Anesthesia Plan Comments:         Anesthesia Quick Evaluation

## 2022-02-26 NOTE — Interval H&P Note (Signed)
History and Physical Interval Note:  02/26/2022 11:18 AM  Douglas Robertson.  has presented today for surgery, with the diagnosis of LEFT HYDROCELE, RECURRENT.  The various methods of treatment have been discussed with the patient and family. After consideration of risks, benefits and other options for treatment, the patient has consented to  Procedure(s): HYDROCELE DRAINAGE AND REPAIR (Left) as a surgical intervention.  The patient's history has been reviewed, patient examined, no change in status, stable for surgery.  I have reviewed the patient's chart and labs.  Questions were answered to the patient's satisfaction.     [employer]

## 2022-02-27 ENCOUNTER — Encounter (HOSPITAL_COMMUNITY): Payer: Self-pay | Admitting: Urology

## 2022-03-30 ENCOUNTER — Ambulatory Visit: Payer: 59

## 2022-04-01 ENCOUNTER — Ambulatory Visit (INDEPENDENT_AMBULATORY_CARE_PROVIDER_SITE_OTHER): Payer: 59

## 2022-04-01 DIAGNOSIS — Z91038 Other insect allergy status: Secondary | ICD-10-CM | POA: Diagnosis not present

## 2022-04-08 ENCOUNTER — Ambulatory Visit: Payer: 59 | Admitting: Allergy & Immunology

## 2022-04-29 ENCOUNTER — Ambulatory Visit (INDEPENDENT_AMBULATORY_CARE_PROVIDER_SITE_OTHER): Payer: 59

## 2022-04-29 DIAGNOSIS — Z91038 Other insect allergy status: Secondary | ICD-10-CM | POA: Diagnosis not present

## 2022-05-26 ENCOUNTER — Ambulatory Visit: Payer: 59 | Admitting: Podiatry

## 2022-05-26 ENCOUNTER — Encounter: Payer: Self-pay | Admitting: Podiatry

## 2022-05-26 ENCOUNTER — Ambulatory Visit (INDEPENDENT_AMBULATORY_CARE_PROVIDER_SITE_OTHER): Payer: 59

## 2022-05-26 DIAGNOSIS — M778 Other enthesopathies, not elsewhere classified: Secondary | ICD-10-CM

## 2022-05-26 DIAGNOSIS — M722 Plantar fascial fibromatosis: Secondary | ICD-10-CM

## 2022-05-26 MED ORDER — TRIAMCINOLONE ACETONIDE 40 MG/ML IJ SUSP
40.0000 mg | Freq: Once | INTRAMUSCULAR | Status: AC
  Administered 2022-05-26: 40 mg

## 2022-05-26 MED ORDER — METHYLPREDNISOLONE 4 MG PO TBPK: ORAL_TABLET | ORAL | 0 refills | Status: DC

## 2022-05-26 MED ORDER — MELOXICAM 15 MG PO TABS: 15.0000 mg | ORAL_TABLET | Freq: Every day | ORAL | 3 refills | Status: DC

## 2022-05-26 NOTE — Progress Notes (Signed)
He presents today chief complaint of pain to the medial plantar medial and the dorsal midfoot left.  He states that he denies any injury.  Objective: Vital signs are stable he is alert and oriented x3.  He has pain to palpation medial calcaneal tubercle posterior tibial tendinitis insertion site and tibialis anterior tendinitis insertion site.  He also has some tenderness on palpation of the fourth fifth tarsometatarsal joint of the left foot.  Radiographs taken today demonstrate an osseously mature foot soft tissue increase in density plantar fascial Caney insertion site indicative of Planter fasciitis she does demonstrate some soft tissue swelling around the medial aspect of the midfoot on AP view.  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  Plan: Discussed etiology pathology conservative surgical therapies at this point start him on a Medrol Dosepak.  He will watch out for his high blood sugars and try to accommodate that as best he can.  I injected left heel 20 mg Kenalog 5 mg Marcaine point maximal tenderness.  Bilateral foot.  Also started him on meloxicam.  Placed him in a plantar fascia braces.

## 2022-05-27 ENCOUNTER — Encounter: Payer: Self-pay | Admitting: Allergy & Immunology

## 2022-05-27 ENCOUNTER — Ambulatory Visit: Payer: 59

## 2022-05-27 ENCOUNTER — Ambulatory Visit: Payer: 59 | Admitting: Allergy & Immunology

## 2022-05-27 ENCOUNTER — Other Ambulatory Visit: Payer: Self-pay | Admitting: Podiatry

## 2022-05-27 ENCOUNTER — Other Ambulatory Visit: Payer: Self-pay

## 2022-05-27 VITALS — BP 118/68 | HR 87 | Resp 17 | Ht 67.0 in | Wt 203.2 lb

## 2022-05-27 DIAGNOSIS — Z91038 Other insect allergy status: Secondary | ICD-10-CM

## 2022-05-27 DIAGNOSIS — M722 Plantar fascial fibromatosis: Secondary | ICD-10-CM

## 2022-05-27 MED ORDER — EPINEPHRINE 0.3 MG/0.3ML IJ SOAJ
0.3000 mg | INTRAMUSCULAR | 2 refills | Status: DC | PRN
Start: 2022-05-27 — End: 2024-05-24

## 2022-05-27 NOTE — Patient Instructions (Addendum)
1. Insect sting allergy (hornets, wasp, yellow jackets) - Continue with venom immunotherapy.  - EpiPen is up to date. - Your venom shots protect against 2-3 stings when you reached the top dose. - Your injections will eventually space out to every 8 weeks.    2. Follow up in one year.    Please inform us of any Emergency Department visits, hospitalizations, or changes in symptoms. Call us before going to the ED for breathing or allergy symptoms since we might be able to fit you in for a sick visit. Feel free to contact us anytime with any questions, problems, or concerns.  It was a pleasure to see you again today!  Websites that have reliable patient information: 1. American Academy of Asthma, Allergy, and Immunology: www.aaaai.org 2. Food Allergy Research and Education (FARE): foodallergy.org 3. Mothers of Asthmatics: http://www.asthmacommunitynetwork.org 4. American College of Allergy, Asthma, and Immunology: www.acaai.org   COVID-19 Vaccine Information can be found at: ShippingScam.co.uk For questions related to vaccine distribution or appointments, please email [email or web address] .com or call [phone].   We realize that you might be concerned about having an allergic reaction to the COVID19 vaccines. To help with that concern, WE ARE OFFERING THE COVID19 VACCINES IN OUR OFFICE! Ask the front desk for dates!     "Like" [place] on Facebook and Instagram for our latest updates!      A healthy democracy works best when [employer] participate! Make sure you are registered to vote! If you have moved or changed any of your contact information, you will need to get this updated before voting!  In some cases, you MAY be able to register to vote online: [email or web address]

## 2022-05-27 NOTE — Progress Notes (Signed)
FOLLOW UP  Date of Service/Encounter:  05/27/22   Assessment:   Hymenoptera allergy - retesting levels just to reassure patient (although it probably will not change our management at all)  Plan/Recommendations:   1. Insect sting allergy (hornets, wasp, yellow jackets) - Continue with venom immunotherapy.  - EpiPen is up to date. - Your venom shots protect against 2-3 stings when you reached the top dose. - Your injections will eventually space out to every 8 weeks.    2. Follow up in one year.     Subjective:   Douglas Robertson. is a 56 y.o. male presenting today for follow up of  Chief Complaint  Patient presents with   Rash   Immunotherapy    Douglas Robertson. 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  Douglas Robertson is a 56 y.o. male presenting for a follow up visit.  He was last seen in June 2022.  At that time, he had recently experienced a yellowjacket sting and did fine with it.  We continue with his venom immunotherapy.  His EpiPen is up-to-date.  Since the last visit, he has done well. They did find the  yellow jacket nests.   Injections are now monthly. He is doing well with them. EpiPen might be out of date. He is unsure of the cost. He has his own insurance and does not use the [employer].   He saw his foot doctor yesterday. He was prescribed Mobic but he does not use it every day. He has plantar fascitis. It has been bothering him for the past two months or so.   His wife works in Product manager. She mostly works at [employer] and [employer].  His work is going well.  He stays fairly busy.  He has no asthma, allergies, or recurrent admissions.  Otherwise, there have been no changes to his past medical history, surgical history, family history, or social history.    Review of Systems  Constitutional: Negative.  Negative for fever, malaise/fatigue and weight loss.  HENT: Negative.  Negative for congestion, ear discharge and ear pain.   Eyes:  Negative for pain, discharge and redness.  Respiratory:  Negative for cough, sputum production, shortness of breath and wheezing.   Cardiovascular: Negative.  Negative for chest pain and palpitations.  Gastrointestinal:  Negative for abdominal pain, constipation, diarrhea, heartburn, nausea and vomiting.  Skin: Negative.  Negative for itching and rash.  Neurological:  Negative for dizziness and headaches.  Endo/Heme/Allergies:  Negative for environmental allergies. Does not bruise/bleed easily.       Objective:   Blood pressure 118/68, pulse 87, resp. rate 17,  height '5\' 7"'$  (1.702 m), weight 203 lb 3.2 oz (92.2 kg), SpO2 98 %. Body mass index is 31.83 kg/m.    Physical Exam Vitals reviewed.  Constitutional:      Appearance: He is well-developed.  HENT:     Head: Normocephalic and atraumatic.     Right Ear: Tympanic membrane, ear canal and external ear normal.     Left Ear: Tympanic membrane, ear canal and external ear normal.     Nose: No nasal deformity, septal deviation, mucosal edema or rhinorrhea.     Right Sinus: No  maxillary sinus tenderness or frontal sinus tenderness.     Left Sinus: No maxillary sinus tenderness or frontal sinus tenderness.     Mouth/Throat:     Mouth: Mucous membranes are not pale and not dry.     Pharynx: Uvula midline.  Eyes:     General:        Right eye: No discharge.        Left eye: No discharge.     Conjunctiva/sclera: Conjunctivae normal.     Right eye: Right conjunctiva is not injected. No chemosis.    Left eye: Left conjunctiva is not injected. No chemosis.    Pupils: Pupils are equal, round, and reactive to light.  Cardiovascular:     Rate and Rhythm: Normal rate and regular rhythm.     Heart sounds: Normal heart sounds.  Pulmonary:     Effort: Pulmonary effort is normal. No tachypnea, accessory muscle usage or respiratory distress.     Breath sounds: Normal breath sounds. No wheezing, rhonchi or rales.  Chest:     Chest wall: No tenderness.  Lymphadenopathy:     Cervical: No cervical adenopathy.  Skin:    Coloration: Skin is not pale.     Findings: No abrasion, erythema, petechiae or rash. Rash is not papular, urticarial or vesicular.  Neurological:     Mental Status: He is alert.  Psychiatric:        Behavior: Behavior is cooperative.      Diagnostic studies: labs sent instead      Salvatore Marvel, MD  [facility]

## 2022-06-07 LAB — ALLERGEN STINGING INSECT PANEL
Honeybee IgE: 0.11 kU/L — AB
Hornet, White Face, IgE: 1.16 kU/L — AB
Hornet, Yellow, IgE: 0.54 kU/L — AB
Paper Wasp IgE: 1.58 kU/L — AB
Yellow Jacket, IgE: 1.22 kU/L — AB

## 2022-06-22 ENCOUNTER — Encounter: Payer: Self-pay | Admitting: Podiatry

## 2022-06-22 ENCOUNTER — Ambulatory Visit: Payer: 59 | Admitting: Podiatry

## 2022-06-22 DIAGNOSIS — M722 Plantar fascial fibromatosis: Secondary | ICD-10-CM | POA: Diagnosis not present

## 2022-06-22 MED ORDER — TRIAMCINOLONE ACETONIDE 40 MG/ML IJ SUSP
40.0000 mg | Freq: Once | INTRAMUSCULAR | Status: AC
  Administered 2022-06-22: 40 mg

## 2022-06-22 NOTE — Progress Notes (Signed)
He presents today for follow-up of his bilateral Planter fasciitis states that the right one is some better but the left foot is still bothering him quite a bit.  He also demonstrates to me today pictures where he has the insoles out of his shoes and has rubbed Sub fifth met head and the heel through the insole.  He states that he would like to consider changing or augmenting his orthotics.  Objective: Vital signs stable alert and oriented x3.  Pulses are palpable.  He has pain on palpation medial calcaneal tubercle of the left heel and the right.  Assessment: Plan fasciitis bilateral left greater than right.  Plan: I will follow-up with him in 1 month for his Planter fasciitis I injected his heels once again today.  May need to consider MRI particular on the left foot.  Asked him to bring his orthotics with him.

## 2022-06-24 ENCOUNTER — Ambulatory Visit (INDEPENDENT_AMBULATORY_CARE_PROVIDER_SITE_OTHER): Payer: 59

## 2022-06-24 DIAGNOSIS — Z91038 Other insect allergy status: Secondary | ICD-10-CM | POA: Diagnosis not present

## 2022-07-07 IMAGING — DX DG CHEST 1V PORT
1 series · 1 of 1 positions shown · non-contrast
Comparison: 05/06/2016

CLINICAL DATA: Syncope

EXAM:
PORTABLE CHEST 1 VIEW

[chest ap]
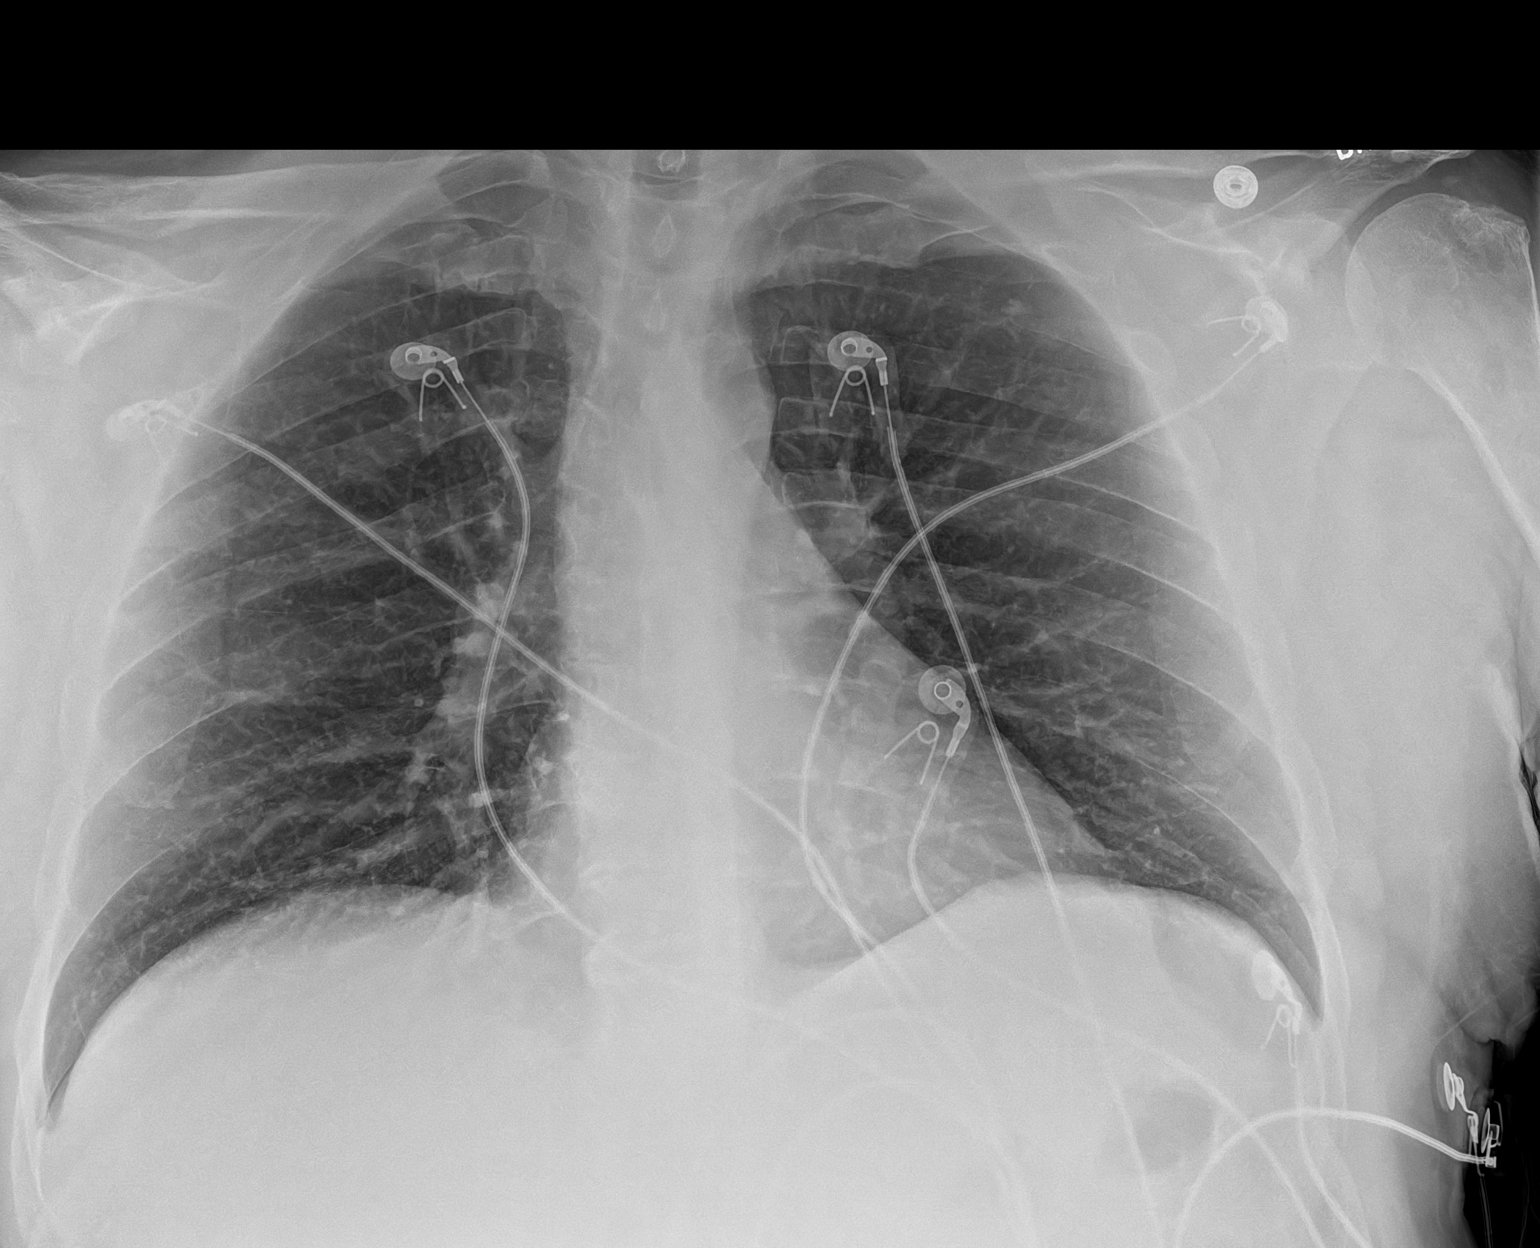

[1 of 1 positions shown; findings below may reference images not displayed]

FINDINGS: Heart and mediastinal contours are within normal limits. No focal
opacities or effusions. No acute bony abnormality.
IMPRESSION: No active disease.

## 2022-07-22 ENCOUNTER — Ambulatory Visit (INDEPENDENT_AMBULATORY_CARE_PROVIDER_SITE_OTHER): Payer: 59

## 2022-07-22 DIAGNOSIS — Z91038 Other insect allergy status: Secondary | ICD-10-CM

## 2022-07-28 ENCOUNTER — Ambulatory Visit (INDEPENDENT_AMBULATORY_CARE_PROVIDER_SITE_OTHER): Payer: 59

## 2022-07-28 ENCOUNTER — Telehealth: Payer: Self-pay | Admitting: Podiatry

## 2022-07-28 ENCOUNTER — Ambulatory Visit: Payer: 59 | Admitting: Podiatry

## 2022-07-28 DIAGNOSIS — M722 Plantar fascial fibromatosis: Secondary | ICD-10-CM

## 2022-07-28 MED ORDER — TRIAMCINOLONE ACETONIDE 40 MG/ML IJ SUSP
20.0000 mg | Freq: Once | INTRAMUSCULAR | Status: AC
  Administered 2022-07-28: 20 mg

## 2022-07-28 NOTE — Telephone Encounter (Signed)
Per Dr Milinda Pointer the orthotics we are casting the patient for are **At NO Charge** pts old orthotics are in your office.

## 2022-07-28 NOTE — Progress Notes (Signed)
Patient presents today to be casted for custom molded orthotics. Dr. Milinda Pointer has been treating patient for plantar fasciitis.   Impression foam cast was taken. ABN signed.  Patient info-  Shoe size: 10 men's medium   Shoe style: work Designer, fashion/clothing  Height: 5'7"  Weight: 200 lbs  Insurance: [employer]   Patient will be notified once orthotics arrive in office and reappoint for fitting at that time.

## 2022-07-29 NOTE — Telephone Encounter (Signed)
Noted  

## 2022-08-02 NOTE — Progress Notes (Signed)
Presents today for follow-up of his plantar fasciitis.  And he brings his orthotics with him today.  He states that his feet are doing better than they were previously however there is still exquisitely tender states that he cannot wear these orthotics which were purchased back in 2018 or 19.  Objective: Vital signs stable alert orient x 3 pulses palpable.  Still has mild to moderate pain on palpation medial calcaneal tubercles bilateral.  Assessment: Plan fasciitis resolving slightly.  Plan: I reinjected bilaterally today obtain his orthotics and we will make him a new pair of orthotics that are much more flexible and we will fit it fit into his everyday shoe gear.

## 2022-08-20 ENCOUNTER — Ambulatory Visit (INDEPENDENT_AMBULATORY_CARE_PROVIDER_SITE_OTHER): Payer: 59

## 2022-08-20 ENCOUNTER — Ambulatory Visit: Payer: 59

## 2022-08-20 DIAGNOSIS — Z91038 Other insect allergy status: Secondary | ICD-10-CM | POA: Diagnosis not present

## 2022-09-10 ENCOUNTER — Ambulatory Visit (INDEPENDENT_AMBULATORY_CARE_PROVIDER_SITE_OTHER): Payer: 59

## 2022-09-10 DIAGNOSIS — M722 Plantar fascial fibromatosis: Secondary | ICD-10-CM

## 2022-09-22 ENCOUNTER — Ambulatory Visit (INDEPENDENT_AMBULATORY_CARE_PROVIDER_SITE_OTHER): Payer: 59

## 2022-09-22 ENCOUNTER — Ambulatory Visit: Payer: 59

## 2022-09-22 DIAGNOSIS — Z91038 Other insect allergy status: Secondary | ICD-10-CM | POA: Diagnosis not present

## 2022-10-19 NOTE — Progress Notes (Signed)
Patient presents today to pick up custom molded foot orthotics recommended by Dr. Milinda Pointer.   Orthotics were dispensed and fit was satisfactory. Reviewed instructions for break-in and wear. Written instructions given to patient.  Patient will follow up as needed.

## 2022-10-20 ENCOUNTER — Ambulatory Visit (INDEPENDENT_AMBULATORY_CARE_PROVIDER_SITE_OTHER): Payer: 59

## 2022-10-20 DIAGNOSIS — Z91038 Other insect allergy status: Secondary | ICD-10-CM

## 2022-11-17 ENCOUNTER — Ambulatory Visit (INDEPENDENT_AMBULATORY_CARE_PROVIDER_SITE_OTHER): Payer: 59

## 2022-11-17 DIAGNOSIS — Z91038 Other insect allergy status: Secondary | ICD-10-CM

## 2022-11-26 NOTE — Telephone Encounter (Signed)
Jocelyn Lamer please reverse the charges for the orthotics.  He was no suppose to be charged for them

## 2022-12-15 ENCOUNTER — Ambulatory Visit: Payer: 59

## 2022-12-17 ENCOUNTER — Ambulatory Visit (INDEPENDENT_AMBULATORY_CARE_PROVIDER_SITE_OTHER): Payer: 59 | Admitting: *Deleted

## 2022-12-17 DIAGNOSIS — Z91038 Other insect allergy status: Secondary | ICD-10-CM | POA: Diagnosis not present

## 2023-01-12 ENCOUNTER — Encounter: Payer: Self-pay | Admitting: Podiatry

## 2023-01-12 ENCOUNTER — Ambulatory Visit: Payer: 59 | Admitting: Podiatry

## 2023-01-12 DIAGNOSIS — M722 Plantar fascial fibromatosis: Secondary | ICD-10-CM | POA: Diagnosis not present

## 2023-01-12 MED ORDER — TRIAMCINOLONE ACETONIDE 40 MG/ML IJ SUSP
20.0000 mg | Freq: Once | INTRAMUSCULAR | Status: AC
  Administered 2023-01-12: 20 mg

## 2023-01-13 NOTE — Progress Notes (Signed)
He presents today for follow-up of his Planter fasciitis states that the right was doing fine the left foot is painful.  States that these orthotics are way too hard.  Objective: Vital signs stable alert oriented x 3 there is no erythema edema salines drainage or odor examination orthotics do demonstrate a graphite insole they were supposed to be more flexible according to my previous notes.  However they were made incorrectly.  He does have pain on palpation medial calcaneal tubercle of the left heel.  Assessment: Planter fasciitis left resolving Planter fasciitis right.  Plan: Reinjected left heel today he will be scanned for new set of orthotics.

## 2023-01-28 ENCOUNTER — Ambulatory Visit (INDEPENDENT_AMBULATORY_CARE_PROVIDER_SITE_OTHER): Payer: 59

## 2023-01-28 DIAGNOSIS — Z91038 Other insect allergy status: Secondary | ICD-10-CM

## 2023-02-25 ENCOUNTER — Ambulatory Visit (INDEPENDENT_AMBULATORY_CARE_PROVIDER_SITE_OTHER): Payer: 59 | Admitting: *Deleted

## 2023-02-25 ENCOUNTER — Ambulatory Visit (INDEPENDENT_AMBULATORY_CARE_PROVIDER_SITE_OTHER): Payer: 59 | Admitting: Podiatry

## 2023-02-25 DIAGNOSIS — M722 Plantar fascial fibromatosis: Secondary | ICD-10-CM

## 2023-02-25 DIAGNOSIS — M778 Other enthesopathies, not elsewhere classified: Secondary | ICD-10-CM | POA: Diagnosis not present

## 2023-02-25 DIAGNOSIS — Z91038 Other insect allergy status: Secondary | ICD-10-CM | POA: Diagnosis not present

## 2023-02-25 MED ORDER — CETIRIZINE HCL 10 MG PO TABS
10.0000 mg | ORAL_TABLET | Freq: Every day | ORAL | Status: AC
  Administered 2023-02-25: 10 mg via ORAL

## 2023-02-25 NOTE — Progress Notes (Signed)
Patient presents today to pick up custom inserts   Patient was dispensed 1 pair of custom orthotics . Fit was satisfactory. Instructions for break-in and wear was reviewed and a copy was given to the patient.

## 2023-02-25 NOTE — Addendum Note (Signed)
Addended by: Dollene Cleveland R on: 02/25/2023 04:56 PM   Modules accepted: Orders

## 2023-02-26 ENCOUNTER — Telehealth: Payer: Self-pay | Admitting: *Deleted

## 2023-02-26 NOTE — Telephone Encounter (Signed)
Called and left a voicemail asking for patient to return call to see how he is doing after his venom injections yesterday.

## 2023-02-26 NOTE — Telephone Encounter (Signed)
Called and left a voicemail asking for patient to return call to discuss.  °

## 2023-03-01 NOTE — Telephone Encounter (Signed)
Called and spoke with the patient and he stated that he feels fine after his venom injections and has not had any issues. He states that the "bite" on his neck is still the same through. He denies any warmth when touching it and it hasn't seemed to come to a head or anything. He states that it's still about the size of a quarter.

## 2023-03-02 NOTE — Telephone Encounter (Signed)
Ok. Thank you for following up with him

## 2023-03-25 ENCOUNTER — Ambulatory Visit (INDEPENDENT_AMBULATORY_CARE_PROVIDER_SITE_OTHER): Payer: 59

## 2023-03-25 DIAGNOSIS — Z91038 Other insect allergy status: Secondary | ICD-10-CM

## 2023-04-22 ENCOUNTER — Ambulatory Visit (INDEPENDENT_AMBULATORY_CARE_PROVIDER_SITE_OTHER): Payer: 59 | Admitting: *Deleted

## 2023-04-22 DIAGNOSIS — Z91038 Other insect allergy status: Secondary | ICD-10-CM

## 2023-04-29 ENCOUNTER — Encounter: Payer: Self-pay | Admitting: "Endocrinology

## 2023-04-29 ENCOUNTER — Ambulatory Visit: Payer: 59 | Admitting: "Endocrinology

## 2023-04-29 VITALS — BP 130/78 | HR 82 | Ht 67.0 in | Wt 216.0 lb

## 2023-04-29 DIAGNOSIS — Z7985 Long-term (current) use of injectable non-insulin antidiabetic drugs: Secondary | ICD-10-CM | POA: Insufficient documentation

## 2023-04-29 DIAGNOSIS — I10 Essential (primary) hypertension: Secondary | ICD-10-CM

## 2023-04-29 DIAGNOSIS — E1122 Type 2 diabetes mellitus with diabetic chronic kidney disease: Secondary | ICD-10-CM

## 2023-04-29 DIAGNOSIS — E782 Mixed hyperlipidemia: Secondary | ICD-10-CM | POA: Diagnosis not present

## 2023-04-29 DIAGNOSIS — N1831 Chronic kidney disease, stage 3a: Secondary | ICD-10-CM

## 2023-04-29 DIAGNOSIS — E6609 Other obesity due to excess calories: Secondary | ICD-10-CM | POA: Diagnosis not present

## 2023-04-29 DIAGNOSIS — Z6833 Body mass index (BMI) 33.0-33.9, adult: Secondary | ICD-10-CM

## 2023-04-29 MED ORDER — SEMAGLUTIDE (2 MG/DOSE) 8 MG/3ML ~~LOC~~ SOPN: 2.0000 mg | PEN_INJECTOR | SUBCUTANEOUS | 3 refills | Status: DC

## 2023-04-29 NOTE — Progress Notes (Signed)
Endocrinology Consult Note       04/29/2023, 5:48 PM   Subjective:    Patient ID: Douglas Edwards., male    DOB: [birth year].  Douglas Edwards. is being seen in consultation for management of currently uncontrolled symptomatic diabetes requested by  Richardean Chimera, MD.   Past Medical History:  Diagnosis Date   Cancer [facility])    kidney (L)   Chronic kidney disease 2017   left renal mass   Diabetes mellitus without complication (HCC)    Eczema    Hypertension     Past Surgical History:  Procedure Laterality Date   COLONOSCOPY  2012   COLONOSCOPY N/A 09/03/2016   Procedure: COLONOSCOPY;  Surgeon: Malissa Hippo, MD;  Location: [facility];  Service: Endoscopy;  Laterality: N/A;  730   HYDROCELE EXCISION Left 01/01/2022   Procedure: HYDROCELECTOMY ADULT;  Surgeon: Heloise Purpura, MD;  Location: [facility];  Service: Urology;  Laterality: Left;  ONLY NEEDS 60 MIN   HYDROCELE EXCISION N/A 02/26/2022   Procedure: HYDROCELE DRAINAGE AND REPAIR, LEFT;  Surgeon: Heloise Purpura, MD;  Location: [facility];  Service: Urology;  Laterality: N/A;   KNEE ARTHROSCOPY  1998    right    LAPAROSCOPIC NEPHRECTOMY Left 10/10/2015   Procedure: LAPAROSCOPIC RADICAL NEPHRECTOMY;  Surgeon: Heloise Purpura, MD;  Location: [facility];  Service: Urology;  Laterality: Left;   SHOULDER SURGERY      Social History   Socioeconomic History   Marital status: Married    Spouse name: Not on file   Number of children: Not on file   Years of education: Not on file   Highest education level: Not on file  Occupational History   Not on file  Tobacco Use   Smoking status: Former   Smokeless tobacco: Former  Advertising account planner   Vaping status: Never Used  Substance and Sexual Activity   Alcohol use: No   Drug use: No   Sexual activity: Not on file  Other Topics Concern   Not on file  Social History Narrative   Not on file   Social  Determinants of Health   Financial Resource Strain: Not on file  Food Insecurity: Not on file  Transportation Needs: Not on file  Physical Activity: Not on file  Stress: Not on file  Social Connections: Not on file    Family History  Problem Relation Age of Onset   Colon cancer Neg Hx     Outpatient Encounter Medications as of 04/29/2023  Medication Sig   Semaglutide, 2 MG/DOSE, 8 MG/3ML SOPN Inject 2 mg as directed once a week.   [DISCONTINUED] Semaglutide, 1 MG/DOSE, (OZEMPIC, 1 MG/DOSE,) 4 MG/3ML SOPN Inject into the skin once a week.   atorvastatin (LIPITOR) 20 MG tablet Take 20 mg by mouth daily.   EPINEPHrine (EPIPEN 2-PAK) 0.3 mg/0.3 mL IJ SOAJ injection Inject 0.3 mg into the muscle as needed for anaphylaxis.   metFORMIN (GLUCOPHAGE-XR) 500 MG 24 hr tablet Take 500 mg by mouth daily after breakfast.   ONETOUCH DELICA LANCETS 33G MISC    ONETOUCH VERIO test strip  testosterone cypionate (DEPOTESTOSTERONE CYPIONATE) 200 MG/ML injection Inject 100 mg into the muscle every 14 (fourteen) days.   valsartan-hydrochlorothiazide (DIOVAN-HCT) 160-12.5 MG tablet Take 1 tablet by mouth daily.   [DISCONTINUED] meloxicam (MOBIC) 15 MG tablet Take 1 tablet (15 mg total) by mouth daily.   [DISCONTINUED] OZEMPIC, 0.25 OR 0.5 MG/DOSE, 2 MG/3ML SOPN Inject 0.25 mg into the skin every Sunday.   Facility-Administered Encounter Medications as of 04/29/2023  Medication   cetirizine (ZYRTEC) tablet 10 mg    ALLERGIES: Allergies  Allergen Reactions   Bee Venom Anaphylaxis    VACCINATION STATUS: Immunization History  Administered Date(s) Administered   Influenza-Unspecified 07/08/2018    Diabetes He presents for his initial diabetic visit. He has type 2 diabetes mellitus. Onset time: He was diagnosed at approximate age of 45 years. There are no hypoglycemic associated symptoms. Pertinent negatives for hypoglycemia include no headaches, seizures or tremors. There are no diabetic  associated symptoms. Pertinent negatives for diabetes include no chest pain and no polydipsia. There are no hypoglycemic complications. Symptoms are stable. Diabetic complications include nephropathy. (He underwent left nephrectomy due to renal CA in 2017.  His remaining kidney shows declining function with CKD stage 3a.) Risk factors for coronary artery disease include family history, dyslipidemia, obesity, male sex and tobacco exposure. Current diabetic treatments: He is currently on Ozempic 1 mg subcutaneously weekly, metformin 1500 mg daily. His weight is fluctuating minimally. He is following a generally unhealthy diet. When asked about meal planning, he reported none. He has not had a previous visit with a dietitian. He participates in exercise intermittently. His home blood glucose trend is decreasing steadily. (She did not bring any logs nor meter with him.  His point-of-care A1c was found to be 6.4%, improving.  He does not report hypoglycemia.) An ACE inhibitor/angiotensin II receptor blocker is being taken.  Hyperlipidemia This is a chronic problem. The current episode started more than 1 year ago. Exacerbating diseases include chronic renal disease, diabetes, liver disease and obesity. Pertinent negatives include no chest pain, myalgias or shortness of breath. Current antihyperlipidemic treatment includes statins. Risk factors for coronary artery disease include dyslipidemia, diabetes mellitus, obesity, family history and male sex.  Hypertension This is a chronic problem. The current episode started more than 1 year ago. The problem is controlled. Pertinent negatives include no chest pain, headaches, palpitations or shortness of breath. Past treatments include angiotensin blockers. Hypertensive end-organ damage includes kidney disease. Identifiable causes of hypertension include chronic renal disease.     Review of Systems  Constitutional:  Negative for chills and fever.  Respiratory:  Negative  for cough and shortness of breath.   Cardiovascular:  Negative for chest pain and palpitations.       No Shortness of breath  Gastrointestinal:  Negative for abdominal pain, diarrhea, nausea and vomiting.  Endocrine: Negative for polydipsia.  Genitourinary:  Negative for frequency, hematuria and urgency.  Musculoskeletal:  Negative for myalgias.  Skin:  Negative for rash.  Neurological:  Negative for tremors, seizures and headaches.  Hematological:  Does not bruise/bleed easily.  Psychiatric/Behavioral:  Negative for hallucinations and suicidal ideas.     Objective:       04/29/2023    3:01 PM 05/27/2022    8:44 AM 02/26/2022    3:00 PM  Vitals with BMI  Height 5\' 7"  5\' 7"    Weight 216 lbs 203 lbs 3 oz   BMI 33.82 31.82   Systolic 130 118 782  Diastolic 78 68 74  Pulse  82 87 55    BP 130/78   Pulse 82   Ht 5\' 7"  (1.702 m)   Wt 216 lb (98 kg)   BMI 33.83 kg/m   Wt Readings from Last 3 Encounters:  04/29/23 216 lb (98 kg)  05/27/22 203 lb 3.2 oz (92.2 kg)  02/26/22 207 lb 0.2 oz (93.9 kg)     Physical Exam Constitutional:      General: He is not in acute distress.    Appearance: He is well-developed.  HENT:     Head: Normocephalic and atraumatic.  Neck:     Thyroid: No thyromegaly.     Trachea: No tracheal deviation.  Cardiovascular:     Rate and Rhythm: Normal rate.     Pulses:          Dorsalis pedis pulses are 1+ on the right side and 1+ on the left side.       Posterior tibial pulses are 1+ on the right side and 1+ on the left side.     Heart sounds: Normal heart sounds, S1 normal and S2 normal. No murmur heard.    No gallop.  Pulmonary:     Effort: Pulmonary effort is normal. No respiratory distress.     Breath sounds: Normal breath sounds. No wheezing.  Abdominal:     General: Bowel sounds are normal. There is no distension.     Palpations: Abdomen is soft.     Tenderness: There is no abdominal tenderness. There is no guarding.  Musculoskeletal:      Right shoulder: No swelling or deformity.     Cervical back: Normal range of motion and neck supple.  Skin:    General: Skin is warm and dry.     Findings: No rash.     Nails: There is no clubbing.  Neurological:     Mental Status: He is alert and oriented to person, place, and time.     Cranial Nerves: No cranial nerve deficit.     Sensory: No sensory deficit.     Gait: Gait normal.     Deep Tendon Reflexes: Reflexes are normal and symmetric.  Psychiatric:        Speech: Speech normal.        Behavior: Behavior normal. Behavior is cooperative.        Thought Content: Thought content normal.        Judgment: Judgment normal.     CMP ( most recent) CMP     Component Value Date/Time   NA 137 02/23/2022 1316   K 3.9 02/23/2022 1316   CL 103 02/23/2022 1316   CO2 24 02/23/2022 1316   GLUCOSE 105 (H) 02/23/2022 1316   BUN 24 (H) 02/23/2022 1316   CREATININE 1.40 (H) 02/23/2022 1316   CALCIUM 10.2 02/23/2022 1316   PROT 7.4 05/01/2020 1324   ALBUMIN 4.1 05/01/2020 1324   AST 36 05/01/2020 1324   ALT 43 05/01/2020 1324   ALKPHOS 36 (L) 05/01/2020 1324   BILITOT 0.7 05/01/2020 1324   GFRNONAA 59 (L) 02/23/2022 1316     Diabetic Labs (most recent): Lab Results  Component Value Date   HGBA1C 6.4 (H) 02/23/2022   HGBA1C 6.0 (H) 12/15/2021   HGBA1C 10.7 (H) 05/06/2016     Assessment & Plan:   1. Type 2 diabetes mellitus with stage 3a chronic kidney disease, without long-term current use of insulin (HCC)  - Douglas Edwards. has currently uncontrolled symptomatic type 2 DM since  57  years of age,  with improving point-of-care A1c today of 6.4%.  Recent labs reviewed. - I had a long discussion with him about the possible risk factors and  the pathology behind its diabetes and its complications. -his has metabolic syndrome complicated by fatty liver disease and diabetes which in turn  is complicated by history of renal CA status post left nephrectomy and CKD, obesity,  comorbid hypertension, hyperlipidemia and he remains at a high risk for more acute and chronic complications which include CAD, CVA, CKD, retinopathy, and neuropathy. These are all discussed in detail with him.  - I discussed all available options of managing his diabetes including de-escalation of medications. I have counseled him on Food as Medicine by adopting a Whole Food , Plant Predominant  ( WFPP) nutrition as recommended by [employer] of Lifestyle Medicine. Patient is encouraged to switch to  unprocessed or minimally processed  complex starch, adequate protein intake (mainly plant source), minimal liquid fat, plenty of fruits, and vegetables. -  he is advised to stick to a routine mealtimes to eat 3 complete meals a day and snack only when necessary ( to snack only to correct hypoglycemia BG <70 day time or <100 at night).   - he acknowledges that there is a room for improvement in his food and drink choices. - Further Specific Suggestion is made for him to avoid simple carbohydrates  from his diet including Cakes, Sweet Desserts, Ice Cream, Soda (diet and regular), Sweet Tea, Candies, Chips, Cookies, Store Bought Juices, Alcohol ,  Artificial Sweeteners,  Coffee Creamer, and "Sugar-free" Products. This will help patient to have more stable blood glucose profile and potentially avoid unintended weight gain.  The following Lifestyle Medicine recommendations according to American College of Lifestyle Medicine [facility]) were discussed and offered to patient and he agrees to start the journey:  A.  Whole Foods, Plant-based plate comprising of fruits and vegetables, plant-based proteins, whole-grain carbohydrates was discussed in detail with the patient.   A list for source of those nutrients were also provided to the patient.  Patient will use only water or unsweetened tea for hydration. B.  The need to stay away from risky substances including alcohol, smoking; obtaining 7 to 9 hours of restorative  sleep, at least 150 minutes of moderate intensity exercise weekly, the importance of healthy social connections,  and stress reduction techniques were discussed. C.  A full color page of  Calorie density of various food groups per pound showing examples of each food groups was provided to the patient.  - he will be scheduled with Norm Salt, RDN, CDE for individualized diabetes education.  - I have approached him with the following individualized plan to manage  his diabetes and patient agrees:   -In light of his presentation with controlled glycemia, he will not need insulin treatment for now.  May benefit from a higher dose of Ozempic, discussed and increased Ozempic to 2 mg subcutaneously weekly.   -Because of stage  3a CKD, he is advised to lower his metformin to 500 mg p.o. daily after breakfast.   -On subsequent visit, he will be switched to low-dose SGLT2 inhibitors instead of metformin.  - Specific targets for  A1c;  LDL, HDL,  and Triglycerides were discussed with the patient.  2) Blood Pressure /Hypertension:  his blood pressure is  controlled to target.   he is advised to continue his current medications including valsartan/HCTZ 160-12.5 mg p.o. daily with breakfast . 3) Lipids/Hyperlipidemia: No recent lipid  panel to review.  He is advised to continue atorvastatin 20 mg p.o. nightly.  He will be considered for fasting lipid panel before his next visit.  Whole food plant-based diet as discussed above will help him address dyslipidemia.   4)  Weight/Diet:  Body mass index is 33.83 kg/m.  -   clearly complicating his diabetes care.   he is  a candidate for weight loss. I discussed with him the fact that loss of 5 - 10% of his  current body weight will have the most impact on his diabetes management.  The above detailed  ACLM recommendations for nutrition, exercise, sleep, social life, avoidance of risky substances, the need for restorative sleep   information will also detailed on  discharge instructions.  5) Chronic Care/Health Maintenance:  -he  is on ACEI/ARB and Statin medications and  is encouraged to initiate and continue to follow up with Ophthalmology, Dentist,  Podiatrist at least yearly or according to recommendations, and advised to   stay away from smoking. I have recommended yearly flu vaccine and pneumonia vaccine at least every 5 years; moderate intensity exercise for up to 150 minutes weekly; and  sleep for 7- 9 hours a day.  - he is  advised to maintain close follow up with Richardean Chimera, MD for primary care needs, as well as his other providers for optimal and coordinated care.   Thank you for involving me in the care of this pleasant patient.  I spent  62  minutes in the care of the patient today including review of labs from CMP, Lipids, Thyroid Function, Hematology (current and previous including abstractions from other facilities); face-to-face time discussing  his blood glucose readings/logs, discussing hypoglycemia and hyperglycemia episodes and symptoms, medications doses, his options of short and long term treatment based on the latest standards of care / guidelines;  discussion about incorporating lifestyle medicine;  and documenting the encounter. Risk reduction counseling performed per USPSTF guidelines to reduce  obesity and cardiovascular risk factors.      Please refer to Patient Instructions for Blood Glucose Monitoring and Insulin/Medications Dosing Guide"  in media tab for additional information. Please  also refer to " Patient Self Inventory" in the Media  tab for reviewed elements of pertinent patient history.  Douglas Edwards. participated in the discussions, expressed understanding, and voiced agreement with the above plans.  All questions were answered to his satisfaction. he is encouraged to contact clinic should he have any questions or concerns prior to his return visit.   Follow up plan: - Return in about 4 months (around  08/29/2023) for Fasting Labs  in AM B4 8, A1c -NV.  Marquis Lunch, MD [facility] [address] [place], [place] [address] Phone: [phone]  Fax: [phone]    04/29/2023, 5:48 PM  This note was partially dictated with voice recognition software. Similar sounding words can be transcribed inadequately or may not  be corrected upon review.

## 2023-04-29 NOTE — Patient Instructions (Signed)

## 2023-05-18 ENCOUNTER — Ambulatory Visit (INDEPENDENT_AMBULATORY_CARE_PROVIDER_SITE_OTHER): Payer: 59

## 2023-05-18 DIAGNOSIS — Z91038 Other insect allergy status: Secondary | ICD-10-CM

## 2023-06-15 ENCOUNTER — Ambulatory Visit (INDEPENDENT_AMBULATORY_CARE_PROVIDER_SITE_OTHER): Payer: 59 | Admitting: *Deleted

## 2023-06-15 DIAGNOSIS — Z91038 Other insect allergy status: Secondary | ICD-10-CM

## 2023-07-20 ENCOUNTER — Telehealth: Payer: Self-pay | Admitting: *Deleted

## 2023-07-20 NOTE — Telephone Encounter (Signed)
Is this a one time episode? We could space to every 8 weeks if he wants. Or he could come in earlier.

## 2023-07-20 NOTE — Telephone Encounter (Signed)
Patient called stating that there was a change in her work schedule and he is wanting to come in and receive his allergy injections sooner then his appointment on 07/27/2023 at every 6 weeks. I did advise that with our policy and intent to keep the patient safe won't don't allow the patient to come in earlier for their venom injection, rather later for patient safety. Patient stated it's already every 6 weeks and a few days early won't hurt and he wants me to ask the doctor. He is wanting to come in on Thursday 07/22/2023 which would make his venom injections 5 days earlier then when he is due. Please advise.

## 2023-07-22 ENCOUNTER — Ambulatory Visit: Payer: 59 | Admitting: *Deleted

## 2023-07-22 DIAGNOSIS — Z91038 Other insect allergy status: Secondary | ICD-10-CM | POA: Diagnosis not present

## 2023-07-22 NOTE — Telephone Encounter (Signed)
I think so because his work scheduled changed. He just changed to every 6 weeks. I did advise Dr. Dellis Anes that he just needed to come early this one time because of his schedule change. Dr. Dellis Anes gave verbal permission for the patient to come in earlier this one time to receive his venom injection. I called and left a voicemail asking for the patient to return call to see if we can get him rescheduled to come in today to receive his venom injection.

## 2023-07-23 NOTE — Telephone Encounter (Signed)
Patient was able to come in yesterday for his venom injections.

## 2023-07-27 ENCOUNTER — Ambulatory Visit: Payer: 59

## 2023-08-02 ENCOUNTER — Encounter (INDEPENDENT_AMBULATORY_CARE_PROVIDER_SITE_OTHER): Payer: Self-pay | Admitting: *Deleted

## 2023-08-11 ENCOUNTER — Other Ambulatory Visit: Payer: Self-pay | Admitting: "Endocrinology

## 2023-08-21 LAB — TSH: TSH: 2.1 u[IU]/mL (ref 0.450–4.500)

## 2023-08-21 LAB — LIPID PANEL
Chol/HDL Ratio: 3.2 {ratio} (ref 0.0–5.0)
Cholesterol, Total: 100 mg/dL (ref 100–199)
HDL: 31 mg/dL — ABNORMAL LOW (ref >39)
LDL Chol Calc (NIH): 44 mg/dL (ref 0–99)
Triglycerides: 141 mg/dL (ref 0–149)
VLDL Cholesterol Cal: 25 mg/dL (ref 5–40)

## 2023-08-21 LAB — COMPREHENSIVE METABOLIC PANEL
ALT: 49 [IU]/L — ABNORMAL HIGH (ref 0–44)
AST: 34 [IU]/L (ref 0–40)
Albumin: 4.6 g/dL (ref 3.8–4.9)
Alkaline Phosphatase: 58 [IU]/L (ref 44–121)
BUN/Creatinine Ratio: 12 (ref 9–20)
BUN: 15 mg/dL (ref 6–24)
Bilirubin Total: 0.6 mg/dL (ref 0.0–1.2)
CO2: 25 mmol/L (ref 20–29)
Calcium: 10.1 mg/dL (ref 8.7–10.2)
Chloride: 97 mmol/L (ref 96–106)
Creatinine, Ser: 1.27 mg/dL (ref 0.76–1.27)
Globulin, Total: 2.9 g/dL (ref 1.5–4.5)
Glucose: 153 mg/dL — ABNORMAL HIGH (ref 70–99)
Potassium: 4.4 mmol/L (ref 3.5–5.2)
Sodium: 136 mmol/L (ref 134–144)
Total Protein: 7.5 g/dL (ref 6.0–8.5)
eGFR: 66 mL/min/{1.73_m2} (ref >59)

## 2023-08-21 LAB — VITAMIN B12: Vitamin B-12: >2000 pg/mL — ABNORMAL HIGH (ref 232–1245)

## 2023-08-21 LAB — VITAMIN D 25 HYDROXY (VIT D DEFICIENCY, FRACTURES): Vit D, 25-Hydroxy: 26.9 ng/mL — ABNORMAL LOW (ref 30.0–100.0)

## 2023-08-21 LAB — T4, FREE: Free T4: 1.18 ng/dL (ref 0.82–1.77)

## 2023-08-30 ENCOUNTER — Encounter: Payer: Self-pay | Admitting: "Endocrinology

## 2023-08-30 ENCOUNTER — Ambulatory Visit: Payer: 59 | Admitting: "Endocrinology

## 2023-08-30 VITALS — BP 136/78 | HR 72 | Ht 67.0 in | Wt 219.8 lb

## 2023-08-30 DIAGNOSIS — E1122 Type 2 diabetes mellitus with diabetic chronic kidney disease: Secondary | ICD-10-CM

## 2023-08-30 DIAGNOSIS — E782 Mixed hyperlipidemia: Secondary | ICD-10-CM | POA: Diagnosis not present

## 2023-08-30 DIAGNOSIS — I10 Essential (primary) hypertension: Secondary | ICD-10-CM | POA: Diagnosis not present

## 2023-08-30 DIAGNOSIS — Z7985 Long-term (current) use of injectable non-insulin antidiabetic drugs: Secondary | ICD-10-CM | POA: Diagnosis not present

## 2023-08-30 DIAGNOSIS — E678 Other specified hyperalimentation: Secondary | ICD-10-CM | POA: Insufficient documentation

## 2023-08-30 DIAGNOSIS — N1831 Chronic kidney disease, stage 3a: Secondary | ICD-10-CM | POA: Diagnosis not present

## 2023-08-30 LAB — POCT GLYCOSYLATED HEMOGLOBIN (HGB A1C): HbA1c, POC (controlled diabetic range): 6.6 % (ref 0.0–7.0)

## 2023-08-30 NOTE — Patient Instructions (Signed)

## 2023-08-30 NOTE — Progress Notes (Signed)
08/30/2023, 6:53 PM  Endocrinology follow-up note   Subjective:    Patient ID: Douglas Edwards., male    DOB: [birth year].  Douglas Edwards. is being seen in follow-up after he was seen in  consultation for management of currently uncontrolled symptomatic diabetes requested by  Richardean Chimera, MD.   Past Medical History:  Diagnosis Date   Cancer [facility])    kidney (L)   Chronic kidney disease 2017   left renal mass   Diabetes mellitus without complication (HCC)    Eczema    Hypertension     Past Surgical History:  Procedure Laterality Date   COLONOSCOPY  2012   COLONOSCOPY N/A 09/03/2016   Procedure: COLONOSCOPY;  Surgeon: Malissa Hippo, MD;  [facility];  Service: Endoscopy;  Laterality: N/A;  730   HYDROCELE EXCISION Left 01/01/2022   Procedure: HYDROCELECTOMY ADULT;  Surgeon: Heloise Purpura, MD;  Location: [facility];  Service: Urology;  Laterality: Left;  ONLY NEEDS 60 MIN   HYDROCELE EXCISION N/A 02/26/2022   Procedure: HYDROCELE DRAINAGE AND REPAIR, LEFT;  Surgeon: Heloise Purpura, MD;  Location: [facility];  Service: Urology;  Laterality: N/A;   KNEE ARTHROSCOPY  1998    right    LAPAROSCOPIC NEPHRECTOMY Left 10/10/2015   Procedure: LAPAROSCOPIC RADICAL NEPHRECTOMY;  Surgeon: Heloise Purpura, MD;  Location: [facility];  Service: Urology;  Laterality: Left;   SHOULDER SURGERY      Social History   Socioeconomic History   Marital status: Married    Spouse name: Not on file   Number of children: Not on file   Years of education: Not on file   Highest education level: Not on file  Occupational History   Not on file  Tobacco Use   Smoking status: Former   Smokeless tobacco: Former  Advertising account planner   Vaping status: Never Used  Substance and Sexual Activity   Alcohol use: No   Drug use: No   Sexual activity: Not on file  Other Topics Concern   Not on file  Social History  Narrative   Not on file   Social Drivers of Health   Financial Resource Strain: Not on file  Food Insecurity: Not on file  Transportation Needs: Not on file  Physical Activity: Not on file  Stress: Not on file (07/15/2023)  Social Connections: Not on file    Family History  Problem Relation Age of Onset   Colon cancer Neg Hx     Outpatient Encounter Medications as of 08/30/2023  Medication Sig   atorvastatin (LIPITOR) 20 MG tablet Take 20 mg by mouth daily.   EPINEPHrine (EPIPEN 2-PAK) 0.3 mg/0.3 mL IJ SOAJ injection Inject 0.3 mg into the muscle as needed for anaphylaxis.   metFORMIN (GLUCOPHAGE-XR) 500 MG 24 hr tablet Take 500 mg by mouth daily after breakfast.   ONETOUCH DELICA LANCETS 33G MISC    ONETOUCH VERIO test strip    Semaglutide, 2 MG/DOSE, (OZEMPIC, 2 MG/DOSE,) 8 MG/3ML SOPN INJECT 2 MG SUBCUTANEOUSLY ONCE A WEEK   testosterone cypionate (DEPOTESTOSTERONE CYPIONATE) 200 MG/ML injection Inject  100 mg into the muscle every 14 (fourteen) days.   valsartan-hydrochlorothiazide (DIOVAN-HCT) 160-12.5 MG tablet Take 1 tablet by mouth daily.   Facility-Administered Encounter Medications as of 08/30/2023  Medication   cetirizine (ZYRTEC) tablet 10 mg    ALLERGIES: Allergies  Allergen Reactions   Bee Venom Anaphylaxis    VACCINATION STATUS: Immunization History  Administered Date(s) Administered   Influenza-Unspecified 07/08/2018    Diabetes He presents for his follow-up diabetic visit. He has type 2 diabetes mellitus. Onset time: He was diagnosed at approximate age of 45 years. His disease course has been stable. There are no hypoglycemic associated symptoms. Pertinent negatives for hypoglycemia include no headaches, seizures or tremors. There are no diabetic associated symptoms. Pertinent negatives for diabetes include no chest pain and no polydipsia. There are no hypoglycemic complications. Symptoms are stable. Diabetic complications include nephropathy. 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  His remaining kidney shows declining function with CKD stage 3a.) Risk factors for coronary artery disease include family history, dyslipidemia, obesity, male sex and tobacco exposure. Current diabetic treatments: He is currently on Ozempic 1 mg subcutaneously weekly, metformin 500 mg daily. His weight is increasing steadily. He is following a generally unhealthy diet. When asked about meal planning, he reported none. He has not had a previous visit with a dietitian. He participates in exercise intermittently. His home blood glucose trend is decreasing steadily. (He presents with significant glycemic profile with point-of-care A1c of 6.6%.  He does not report any hypoglycemia. ) An ACE inhibitor/angiotensin II receptor blocker is being taken.  Hyperlipidemia This is a chronic problem. The current episode started more than 1 year ago. Exacerbating diseases include chronic renal disease, diabetes, liver disease and obesity. Pertinent negatives include no chest pain, myalgias or shortness of breath. Current antihyperlipidemic treatment includes statins. Risk factors for coronary artery disease include dyslipidemia, diabetes mellitus, obesity, family history and male sex.  Hypertension This is a chronic problem. The current episode started more than 1 year ago. The problem is controlled. Pertinent negatives include no chest pain, headaches, palpitations or shortness of breath. Risk factors for coronary artery disease include dyslipidemia, diabetes mellitus, obesity and male gender. Past treatments include angiotensin blockers. Hypertensive end-organ damage includes kidney disease. Identifiable causes of hypertension include chronic renal disease.     Review of Systems  Constitutional:  Negative for chills and fever.  Respiratory:  Negative for cough and shortness of breath.   Cardiovascular:  Negative for chest pain and palpitations.       No Shortness of  breath  Gastrointestinal:  Negative for abdominal pain, diarrhea, nausea and vomiting.  Endocrine: Negative for polydipsia.  Genitourinary:  Negative for frequency, hematuria and urgency.  Musculoskeletal:  Negative for myalgias.  Skin:  Negative for rash.  Neurological:  Negative for tremors, seizures and headaches.  Hematological:  Does not bruise/bleed easily.  Psychiatric/Behavioral:  Negative for hallucinations and suicidal ideas.     Objective:       08/30/2023    3:45 PM 08/30/2023    3:20 PM 04/29/2023    3:01 PM  Vitals with BMI  Height  5\' 7"  5\' 7"   Weight  219 lbs 13 oz 216 lbs  BMI  34.42 33.82  Systolic 136 142 161  Diastolic 78 78 78  Pulse  72 82    BP 136/78 Comment: L arm with manual cuff  Pulse 72   Ht 5\' 7"  (1.702 m)   Wt 219 lb 12.8 oz (  99.7 kg)   BMI 34.43 kg/m   Wt Readings from Last 3 Encounters:  08/30/23 219 lb 12.8 oz (99.7 kg)  04/29/23 216 lb (98 kg)  05/27/22 203 lb 3.2 oz (92.2 kg)       CMP ( most recent) CMP     Component Value Date/Time   NA 136 08/20/2023 0802   K 4.4 08/20/2023 0802   CL 97 08/20/2023 0802   CO2 25 08/20/2023 0802   GLUCOSE 153 (H) 08/20/2023 0802   GLUCOSE 105 (H) 02/23/2022 1316   BUN 15 08/20/2023 0802   CREATININE 1.27 08/20/2023 0802   CALCIUM 10.1 08/20/2023 0802   PROT 7.5 08/20/2023 0802   ALBUMIN 4.6 08/20/2023 0802   AST 34 08/20/2023 0802   ALT 49 (H) 08/20/2023 0802   ALKPHOS 58 08/20/2023 0802   BILITOT 0.6 08/20/2023 0802   EGFR 66 08/20/2023 0802   GFRNONAA 59 (L) 02/23/2022 1316     Diabetic Labs (most recent): Lab Results  Component Value Date   HGBA1C 6.6 08/30/2023   HGBA1C 6.4 (H) 02/23/2022   HGBA1C 6.0 (H) 12/15/2021   Lipid Panel     Component Value Date/Time   CHOL 100 08/20/2023 0802   TRIG 141 08/20/2023 0802   HDL 31 (L) 08/20/2023 0802   CHOLHDL 3.2 08/20/2023 0802   LDLCALC 44 08/20/2023 0802   LABVLDL 25 08/20/2023 0802     Assessment & Plan:   1. Type  2 diabetes mellitus with stage 3a chronic kidney disease, without long-term current use of insulin (HCC)  - Douglas Edwards. has currently uncontrolled symptomatic type 2 DM since  57 years of age. He presents with significant glycemic profile with point-of-care A1c of 6.6%.  He does not report any hypoglycemia.   -  Recent labs reviewed. - I had a long discussion with him about the possible risk factors and  the pathology behind its diabetes and its complications. -his has metabolic syndrome complicated by fatty liver disease and diabetes which in turn  is complicated by history of renal CA status post left nephrectomy and CKD, obesity, comorbid hypertension, hyperlipidemia and he remains at a high risk for more acute and chronic complications which include CAD, CVA, CKD, retinopathy, and neuropathy. These are all discussed in detail with him.  - I discussed all available options of managing his diabetes including de-escalation of medications. I have counseled him on Food as Medicine by adopting a Whole Food , Plant Predominant  ( WFPP) nutrition as recommended by [employer] of Lifestyle Medicine. Patient is encouraged to switch to  unprocessed or minimally processed  complex starch, adequate protein intake (mainly plant source), minimal liquid fat, plenty of fruits, and vegetables. -  he is advised to stick to a routine mealtimes to eat 3 complete meals a day and snack only when necessary ( to snack only to correct hypoglycemia BG <70 day time or <100 at night).   - he acknowledges that there is a room for improvement in his food and drink choices. - Suggestion is made for him to avoid simple carbohydrates  from his diet including Cakes, Sweet Desserts, Ice Cream, Soda (diet and regular), Sweet Tea, Candies, Chips, Cookies, Store Bought Juices, Alcohol , Artificial Sweeteners,  Coffee Creamer, and "Sugar-free" Products, Lemonade. This will help patient to have more stable blood glucose profile  and potentially avoid unintended weight gain.  The following Lifestyle Medicine recommendations according to [facility] of Lifestyle Medicine  [facility])  were discussed and and offered to patient and he  agrees to start the journey:  A. Whole Foods, Plant-Based Nutrition comprising of fruits and vegetables, plant-based proteins, whole-grain carbohydrates was discussed in detail with the patient.   A list for source of those nutrients were also provided to the patient.  Patient will use only water or unsweetened tea for hydration. B.  The need to stay away from risky substances including alcohol, smoking; obtaining 7 to 9 hours of restorative sleep, at least 150 minutes of moderate intensity exercise weekly, the importance of healthy social connections,  and stress management techniques were discussed. C.  A full color page of  Calorie density of various food groups per pound showing examples of each food groups was provided to the patient.   - he has been scheduled with Norm Salt, RDN, CDE for individualized diabetes education.  - I have approached him with the following individualized plan to manage  his diabetes and patient agrees:   -In light of his presentation with controlled glycemia, he would not need insulin treatment for now.  She will continue to benefit from GLP-1 receptor agonists.  I discussed and increase his Ozempic to 2 mg subcutaneously weekly.   -He has no stable and normal renal function.  He is advised to continue metformin 500 mg p.o. once a day.   -On subsequent visit, he will be switched to low-dose SGLT2 inhibitors instead of metformin.  - Specific targets for  A1c;  LDL, HDL,  and Triglycerides were discussed with the patient.  2) Blood Pressure /Hypertension: His blood pressure is controlled to target.  he is advised to continue his current medications including valsartan/HCTZ 160-12.5 mg p.o. daily with breakfast .  3) Lipids/Hyperlipidemia: His recent lipid  panel showed significant improvement including LDL at 44.    He is advised to continue atorvastatin 20 mg p.o. nightly.  Whole food plant-based diet as discussed above will help him address dyslipidemia.   4)  Weight/Diet:  Body mass index is 34.43 kg/m.  -   clearly complicating his diabetes care.   he is  a candidate for weight loss. I discussed with him the fact that loss of 5 - 10% of his  current body weight will have the most impact on his diabetes management.  The above detailed  ACLM recommendations for nutrition, exercise, sleep, social life, avoidance of risky substances, the need for restorative sleep   information will also detailed on discharge instructions.  5) Chronic Care/Health Maintenance:  -he  is on ACEI/ARB and Statin medications and  is encouraged to initiate and continue to follow up with Ophthalmology, Dentist,  Podiatrist at least yearly or according to recommendations, and advised to   stay away from smoking. I have recommended yearly flu vaccine and pneumonia vaccine at least every 5 years; moderate intensity exercise for up to 150 minutes weekly; and  sleep for 7- 9 hours a day.  - he is  advised to maintain close follow up with Richardean Chimera, MD for primary care needs, as well as his other providers for optimal and coordinated care.   I spent  26  minutes in the care of the patient today including review of labs from CMP, Lipids, Thyroid Function, Hematology (current and previous including abstractions from other facilities); face-to-face time discussing  his blood glucose readings/logs, discussing hypoglycemia and hyperglycemia episodes and symptoms, medications doses, his options of short and long term treatment based on the latest standards of care /  guidelines;  discussion about incorporating lifestyle medicine;  and documenting the encounter. Risk reduction counseling performed per USPSTF guidelines to reduce  obesity and cardiovascular risk factors.     Please  refer to Patient Instructions for Blood Glucose Monitoring and Insulin/Medications Dosing Guide"  in media tab for additional information. Please  also refer to " Patient Self Inventory" in the Media  tab for reviewed elements of pertinent patient history.  Douglas Edwards. participated in the discussions, expressed understanding, and voiced agreement with the above plans.  All questions were answered to his satisfaction. he is encouraged to contact clinic should he have any questions or concerns prior to his return visit.    Follow up plan: - Return in about 4 months (around 12/29/2023) for A1c -NV.  Marquis Lunch, MD [facility] [address] [place], [place] [address] Phone: [phone]  Fax: [phone]    08/30/2023, 6:53 PM  This note was partially dictated with voice recognition software. Similar sounding words can be transcribed inadequately or may not  be corrected upon review.

## 2023-09-08 HISTORY — PX: KNEE ARTHROSCOPY: SHX127

## 2023-09-09 ENCOUNTER — Ambulatory Visit (INDEPENDENT_AMBULATORY_CARE_PROVIDER_SITE_OTHER): Payer: 59

## 2023-09-09 ENCOUNTER — Ambulatory Visit: Payer: 59 | Admitting: *Deleted

## 2023-09-09 ENCOUNTER — Ambulatory Visit: Payer: 59 | Admitting: Physician Assistant

## 2023-09-09 ENCOUNTER — Encounter: Payer: Self-pay | Admitting: Physician Assistant

## 2023-09-09 DIAGNOSIS — Z91038 Other insect allergy status: Secondary | ICD-10-CM | POA: Diagnosis not present

## 2023-09-09 DIAGNOSIS — G8929 Other chronic pain: Secondary | ICD-10-CM

## 2023-09-09 DIAGNOSIS — M25562 Pain in left knee: Secondary | ICD-10-CM | POA: Diagnosis not present

## 2023-09-09 MED ORDER — BUPIVACAINE HCL 0.25 % IJ SOLN
2.0000 mL | INTRAMUSCULAR | Status: AC | PRN
  Administered 2023-09-09: 2 mL via INTRA_ARTICULAR

## 2023-09-09 MED ORDER — METHYLPREDNISOLONE ACETATE 40 MG/ML IJ SUSP
40.0000 mg | INTRAMUSCULAR | Status: AC | PRN
  Administered 2023-09-09: 40 mg via INTRA_ARTICULAR

## 2023-09-09 MED ORDER — LIDOCAINE HCL 1 % IJ SOLN
2.0000 mL | INTRAMUSCULAR | Status: AC | PRN
  Administered 2023-09-09: 2 mL

## 2023-09-09 NOTE — Progress Notes (Signed)
 Office Visit Note   Patient: Douglas Robertson.           Date of Birth: [birth year]           MRN: [id number] Visit Date: 09/09/2023              Requested by: Toribio Jerel MATSU, MD [address] [place],  [place] [address] PCP: Toribio Jerel MATSU, MD   Assessment & Plan: Visit Diagnoses:  1. Chronic pain of left knee     Plan: Impression is left knee pain with questionable underlying lateral meniscus pathology.  We have discussed cortisone injection versus MRI as his symptoms have not improved over the past few months despite conservative treatment to include a physician guided home exercise program.  He would like to proceed with cortisone injection today in addition to obtaining an MRI of the left knee.  Once we obtain the results, we will either call him with the results or have him come in to further discuss.  Call with concerns or questions in meantime.  Follow-Up Instructions: Return for f/u vs call after MRI.   Orders:  Orders Placed This Encounter  Procedures   Large Joint Inj: L knee   XR KNEE 3 VIEW LEFT   MR Knee Left w/o contrast   No orders of the defined types were placed in this encounter.     Procedures: Large Joint Inj: L knee on 09/09/2023 1:23 PM Indications: pain Details: 22 G needle, anterolateral approach Medications: 2 mL lidocaine  1 %; 2 mL bupivacaine  0.25 %; 40 mg methylPREDNISolone  acetate 40 MG/ML      Clinical Data: No additional findings.   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  Symptoms have been ongoing for the past few months and have progressively worsened.  He denies any injury or change in activity.  Pain is primarily located to the anterior lateral knee with some pain to the retropatellar area.  Symptoms primarily occur when he is walking, stair climbing as well as with  pivoting.  He does note occasional locking and catching.  He has been using a brace and applying ice and taking naproxen without relief.  No previous cortisone injection.  He has been working on a physician guided home exercise program as he saw his PCP when the symptoms initially began.  He has not noticed any relief from this.  Review of Systems as detailed in HPI.  All others reviewed and are negative.   Objective: Vital Signs: There were no vitals taken for this visit.  Physical Exam well-developed well-nourished gentleman in no acute distress.  Alert and oriented x 3.  Ortho Exam left knee exam: Trace effusion.  Range of motion 0 to 120 degrees.  He has tenderness to the anterolateral joint line.  No patellofemoral crepitus.  Ligaments are stable.  He is neurovascularly intact distally.  Specialty Comments:  No specialty comments available.  Imaging: XR KNEE 3 VIEW LEFT Result Date: 09/09/2023 Well-preserved joint space.  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   Gross hematuria 09/18/2015   Biceps tendinitis of both shoulders 10/23/2014   Right anterior knee pain 10/23/2014   Contact dermatitis and other eczema 02/07/2014   Type 2 diabetes mellitus with stage 3a chronic kidney disease, without long-term current use of insulin (HCC) 09/18/2013   Disorder of bursae and tendons in shoulder region 04/12/2013   Left shoulder pain 04/12/2013   Essential hypertension, benign 04/06/2013    Mixed hyperlipidemia 04/06/2013   Routine general medical examination at a health care facility 04/06/2013   Precordial pain 10/01/2011   Past Medical History:  Diagnosis Date   Cancer (HCC)    kidney (L)   Chronic kidney disease 2017   left renal mass   Diabetes mellitus without complication (HCC)    Eczema    Hypertension     Family History  Problem Relation Age of Onset   Colon cancer Neg Hx     Past Surgical History:  Procedure Laterality Date   COLONOSCOPY  2012   COLONOSCOPY N/A 09/03/2016   Procedure: COLONOSCOPY;  Surgeon: Claudis RAYMOND Rivet, MD;  [facility];  Service: Endoscopy;  Laterality: N/A;  730   HYDROCELE EXCISION Left 01/01/2022   Procedure: HYDROCELECTOMY ADULT;  Surgeon: Renda Glance, MD;  [facility];  Service: Urology;  Laterality: Left;  ONLY NEEDS 60 MIN   HYDROCELE EXCISION N/A 02/26/2022   Procedure: HYDROCELE DRAINAGE AND REPAIR, LEFT;  Surgeon: Renda Glance, MD;  [facility];  Service: Urology;  Laterality: N/A;   KNEE ARTHROSCOPY  1998    right    LAPAROSCOPIC NEPHRECTOMY Left 10/10/2015   Procedure: LAPAROSCOPIC RADICAL NEPHRECTOMY;  Surgeon: Glance Renda, MD;  Location[facility];  Service: Urology;  Laterality: Left;   SHOULDER SURGERY     Social History   Occupational History   Not on file  Tobacco Use   Smoking status: Former   Smokeless tobacco: Former  Building Services Engineer status: Never Used  Substance and Sexual Activity   Alcohol use: No   Drug use: No   Sexual activity: Not on file

## 2023-09-17 ENCOUNTER — Ambulatory Visit: Payer: 59 | Admitting: Orthopaedic Surgery

## 2023-09-17 ENCOUNTER — Encounter: Payer: Self-pay | Admitting: Physician Assistant

## 2023-09-23 ENCOUNTER — Ambulatory Visit
Admission: RE | Admit: 2023-09-23 | Discharge: 2023-09-23 | Disposition: A | Discharge status: Home / Self Care | Payer: 59 | Source: Ambulatory Visit | Attending: Physician Assistant | Admitting: Physician Assistant

## 2023-09-23 DIAGNOSIS — G8929 Other chronic pain: Secondary | ICD-10-CM

## 2023-09-26 NOTE — Progress Notes (Signed)
F/u with xu to discuss

## 2023-09-27 NOTE — Progress Notes (Signed)
Called patient.  Appt made

## 2023-10-01 ENCOUNTER — Encounter: Payer: Self-pay | Admitting: Orthopaedic Surgery

## 2023-10-01 ENCOUNTER — Ambulatory Visit: Payer: 59 | Admitting: Orthopaedic Surgery

## 2023-10-01 DIAGNOSIS — S83272A Complex tear of lateral meniscus, current injury, left knee, initial encounter: Secondary | ICD-10-CM | POA: Insufficient documentation

## 2023-10-01 DIAGNOSIS — M94262 Chondromalacia, left knee: Secondary | ICD-10-CM | POA: Diagnosis not present

## 2023-10-01 NOTE — Progress Notes (Signed)
Office Visit Note   Patient: Douglas Robertson.           Date of Birth: [birth year]           MRN: [id number] Visit Date: 10/01/2023              Requested by: Richardean Chimera, MD [address] [place],  [place] [address] PCP: Richardean Chimera, MD   Assessment & Plan: Visit Diagnoses:  1. Complex tear of lateral meniscus of left knee as current injury, initial encounter   2. Chondromalacia of knee, left     Plan: [name] is a very pleasant 58 year old gentleman with a complex lateral meniscus tear and a full-thickness chondral injury to the lateral tibial plateau.  The MRI findings were reviewed with the patient in detail and treatment options were given.  Based on his options he has elected to move forward with a left knee scope with partial lateral meniscectomy and abrasion arthroplasty versus microfracture of the chondral defect.  Questions encouraged and answered.  [name] will call him to confirm surgery date.  Follow-Up Instructions: No follow-ups on file.   Orders:  No orders of the defined types were placed in this encounter.  No orders of the defined types were placed in this encounter.     Procedures: No procedures performed   Clinical Data: No additional findings.   Subjective: Chief Complaint  Patient presents with   Left Knee - Follow-up    MRI review    HPI [name] is a very pleasant 58 year old gentleman here to discuss left knee MRI scan.  He saw [name] twice earlier this year for left knee pain.  He reports stiffness and giving way and has occasional nighttime pain.  He has a significant difficulty with using stairs.  Reports decreased range of motion. Review of Systems  Constitutional: Negative.   HENT: Negative.    Eyes: Negative.   Respiratory: Negative.    Cardiovascular: Negative.   Gastrointestinal: Negative.   Endocrine: Negative.   Genitourinary: Negative.   Skin: Negative.   Allergic/Immunologic: Negative.   Neurological: Negative.    Hematological: Negative.   Psychiatric/Behavioral: Negative.    All other systems reviewed and are negative.    Objective: Vital Signs: There were no vitals taken for this visit.  Physical Exam Vitals and nursing note reviewed.  Constitutional:      Appearance: He is well-developed.  Pulmonary:     Effort: Pulmonary effort is normal.  Abdominal:     Palpations: Abdomen is soft.  Skin:    General: Skin is warm.  Neurological:     Mental Status: He is alert and oriented to person, place, and time.  Psychiatric:        Behavior: Behavior normal.        Thought Content: Thought content normal.        Judgment: Judgment normal.     Ortho Exam Examination of the left knee shows a small effusion.  Lateral joint line tenderness.  Collaterals and cruciates are stable.  Pain with McMurray's maneuver. Specialty Comments:  No specialty comments available.  Imaging: No results found.   PMFS History: Patient Active Problem List   Diagnosis Date Noted   Complex tear of lateral meniscus of left knee as current injury 10/01/2023   Chondromalacia of knee, left 10/01/2023   Hypervitaminosis 08/30/2023   Class 1 obesity due to excess calories with serious comorbidity and body mass index (BMI) of 33.0 to 33.9 in adult  04/29/2023   Long-term (current) use of injectable non-insulin antidiabetic drugs 04/29/2023   Tear of left rotator cuff 07/04/2018   Internal impingement of left shoulder 01/18/2018   History of colonic polyps 05/18/2016   Acute bronchitis 01/28/2016   Neoplasm of left kidney 10/10/2015   Gross hematuria 09/18/2015   Biceps tendinitis of both shoulders 10/23/2014   Right anterior knee pain 10/23/2014   Contact dermatitis and other eczema 02/07/2014   Type 2 diabetes mellitus with stage 3a chronic kidney disease, without long-term current use of insulin (HCC) 09/18/2013   Disorder of bursae and tendons in shoulder region 04/12/2013   Left shoulder pain 04/12/2013    Essential hypertension, benign 04/06/2013   Mixed hyperlipidemia 04/06/2013   Routine general medical examination at a health care facility 04/06/2013   Precordial pain 10/01/2011   Past Medical History:  Diagnosis Date   Cancer (HCC)    kidney (L)   Chronic kidney disease 2017   left renal mass   Diabetes mellitus without complication (HCC)    Eczema    Hypertension     Family History  Problem Relation Age of Onset   Colon cancer Neg Hx     Past Surgical History:  Procedure Laterality Date   COLONOSCOPY  2012   COLONOSCOPY N/A 09/03/2016   Procedure: COLONOSCOPY;  Surgeon: Malissa Hippo, MD;  [facility];  Service: Endoscopy;  Laterality: N/A;  730   HYDROCELE EXCISION Left 01/01/2022   Procedure: HYDROCELECTOMY ADULT;  Surgeon: Heloise Purpura, MD;  Location: [facility];  Service: Urology;  Laterality: Left;  ONLY NEEDS 60 MIN   HYDROCELE EXCISION N/A 02/26/2022   Procedure: HYDROCELE DRAINAGE AND REPAIR, LEFT;  Surgeon: Heloise Purpura, MD;  [facility];  Service: Urology;  Laterality: N/A;   KNEE ARTHROSCOPY  1998    right    LAPAROSCOPIC NEPHRECTOMY Left 10/10/2015   Procedure: LAPAROSCOPIC RADICAL NEPHRECTOMY;  Surgeon: Heloise Purpura, MD;  [facility];  Service: Urology;  Laterality: Left;   SHOULDER SURGERY     Social History   Occupational History   Not on file  Tobacco Use   Smoking status: Former   Smokeless tobacco: Former  Building services engineer status: Never Used  Substance and Sexual Activity   Alcohol use: No   Drug use: No   Sexual activity: Not on file

## 2023-10-13 ENCOUNTER — Telehealth: Payer: Self-pay | Admitting: Orthopaedic Surgery

## 2023-10-13 NOTE — Telephone Encounter (Signed)
 Patient is scheduled for left knee arthroscopy, partial lateral meniscectomy, abrasion arthroplasty at [facility] on 10-28-23.  Patient is requesting out of work letter/note to provide his employer.  He is requesting to be out of work starting with the 10-27-23 due to a shift change.  His shift would start at night and it would be too difficult to work 3rd and then show up at the surgical facility on an empty stomach after working all night. Please let the patient know if there is an issue with the request [id number].   Please send the letter/note to patient's email [email or web address] so he is able to forward the note to his employer.  Please do not send through [facility] CHART.

## 2023-10-14 NOTE — Telephone Encounter (Signed)
 That's fine. Thanks!

## 2023-10-15 ENCOUNTER — Other Ambulatory Visit: Payer: Self-pay | Admitting: "Endocrinology

## 2023-10-16 NOTE — Telephone Encounter (Signed)
 6 weeks

## 2023-10-18 NOTE — Telephone Encounter (Signed)
 Note has been created and emailed as requested.

## 2023-10-21 ENCOUNTER — Ambulatory Visit (INDEPENDENT_AMBULATORY_CARE_PROVIDER_SITE_OTHER): Payer: 59

## 2023-10-21 DIAGNOSIS — Z91038 Other insect allergy status: Secondary | ICD-10-CM | POA: Diagnosis not present

## 2023-10-22 ENCOUNTER — Telehealth: Payer: Self-pay | Admitting: Orthopaedic Surgery

## 2023-10-28 ENCOUNTER — Encounter: Payer: Self-pay | Admitting: Orthopaedic Surgery

## 2023-10-28 ENCOUNTER — Other Ambulatory Visit: Payer: Self-pay | Admitting: Physician Assistant

## 2023-10-28 ENCOUNTER — Telehealth: Payer: Self-pay | Admitting: Orthopaedic Surgery

## 2023-10-28 DIAGNOSIS — M94262 Chondromalacia, left knee: Secondary | ICD-10-CM | POA: Diagnosis not present

## 2023-10-28 DIAGNOSIS — S83272A Complex tear of lateral meniscus, current injury, left knee, initial encounter: Secondary | ICD-10-CM | POA: Diagnosis not present

## 2023-10-28 MED ORDER — ONDANSETRON HCL 4 MG PO TABS: 4.0000 mg | ORAL_TABLET | Freq: Three times a day (TID) | ORAL | 0 refills | Status: DC | PRN

## 2023-10-28 MED ORDER — HYDROCODONE-ACETAMINOPHEN 5-325 MG PO TABS
1.0000 | ORAL_TABLET | Freq: Three times a day (TID) | ORAL | 0 refills | Status: DC | PRN
Start: 2023-10-28 — End: 2023-10-29

## 2023-10-28 NOTE — Telephone Encounter (Signed)
 Patient wife called requesting for pain medication for patient. She advised to send it to [facility] [address] [place].

## 2023-10-29 ENCOUNTER — Other Ambulatory Visit: Payer: Self-pay | Admitting: Physician Assistant

## 2023-10-29 MED ORDER — HYDROCODONE-ACETAMINOPHEN 5-325 MG PO TABS: 1.0000 | ORAL_TABLET | Freq: Three times a day (TID) | ORAL | 0 refills | Status: DC | PRN

## 2023-10-29 NOTE — Telephone Encounter (Signed)
 Previously sent pain meds to pharmacy on file prior to this message being sent.  Just resent to this pharmacy

## 2023-11-01 ENCOUNTER — Telehealth: Payer: Self-pay | Admitting: Orthopaedic Surgery

## 2023-11-01 NOTE — Telephone Encounter (Signed)
Shawna [place] financial forms received. To [employer].

## 2023-11-05 ENCOUNTER — Encounter: Payer: Self-pay | Admitting: Physician Assistant

## 2023-11-05 ENCOUNTER — Ambulatory Visit: Payer: 59 | Admitting: Physician Assistant

## 2023-11-05 DIAGNOSIS — Z9889 Other specified postprocedural states: Secondary | ICD-10-CM

## 2023-11-05 NOTE — Progress Notes (Signed)
 Post-Op Visit Note   Patient: Douglas Robertson.           Date of Birth: [birth year]           MRN: [id number] Visit Date: 11/05/2023 PCP: Richardean Chimera, MD   Assessment & Plan:  Chief Complaint:  Chief Complaint  Patient presents with   Left Knee - Follow-up    Left knee scope 10/28/2023   Visit Diagnoses:  1. S/P left knee arthroscopy     Plan: Patient is a pleasant 58 year old gentleman who comes in today 1 week status post left knee arthroscopic debridement medial meniscus and abrasion arthroplasty medial tibial plateau, date of surgery 10/28/2023.  He has been doing well.  Some soreness but nothing he needs to take any pain medicine for.  He is also having some weakness where he feels his knee wants to hyperextend.  Examination of the left knee reveals fully healed surgical portals.  No evidence of infection or cellulitis.  Calf is soft nontender.  His neurovascular intact distally.  Today, sutures were removed and Steri-Strips applied.  Intraoperative pictures reviewed.  Home exercise program provided.  He will follow-up with [place] in 5 weeks for recheck.  If he is doing well he may call and cancel his appointment.  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  Follow-Up Instructions: Return in about 5 weeks (around 12/10/2023).   Orders:  No orders of the defined types were placed in this encounter.  No orders of the defined types were placed in this encounter.   Imaging: No new imaging  PMFS History: Patient Active Problem List   Diagnosis Date Noted   Complex tear of lateral meniscus of left knee as current injury 10/01/2023   Chondromalacia of knee, left 10/01/2023   Hypervitaminosis 08/30/2023   Class 1 obesity due to excess calories with serious comorbidity and body mass index (BMI) of 33.0 to 33.9 in adult 04/29/2023   Long-term (current) use of injectable non-insulin antidiabetic drugs  04/29/2023   Tear of left rotator cuff 07/04/2018   Internal impingement of left shoulder 01/18/2018   History of colonic polyps 05/18/2016   Acute bronchitis 01/28/2016   Neoplasm of left kidney 10/10/2015   Gross hematuria 09/18/2015   Biceps tendinitis of both shoulders 10/23/2014   Right anterior knee pain 10/23/2014   Contact dermatitis and other eczema 02/07/2014   Type 2 diabetes mellitus with stage 3a chronic kidney disease, without long-term current use of insulin (HCC) 09/18/2013   Disorder of bursae and tendons in shoulder region 04/12/2013   Left shoulder pain 04/12/2013   Essential hypertension, benign 04/06/2013   Mixed hyperlipidemia 04/06/2013   Routine general medical examination at a health care facility 04/06/2013   Precordial pain 10/01/2011   Past Medical History:  Diagnosis Date   Cancer (HCC)    kidney (L)   Chronic kidney disease 2017   left renal mass   Diabetes mellitus without complication (HCC)    Eczema    Hypertension     Family History  Problem Relation Age of Onset   Colon cancer Neg Hx     Past Surgical History:  Procedure Laterality Date   COLONOSCOPY  2012   COLONOSCOPY N/A 09/03/2016   Procedure: COLONOSCOPY;  Surgeon: Malissa Hippo, MD;  [facility];  Service: Endoscopy;  Laterality: N/A;  730   HYDROCELE  EXCISION Left 01/01/2022   Procedure: HYDROCELECTOMY ADULT;  Surgeon: Heloise Purpura, MD;  [facility];  Service: Urology;  Laterality: Left;  ONLY NEEDS 60 MIN   HYDROCELE EXCISION N/A 02/26/2022   Procedure: HYDROCELE DRAINAGE AND REPAIR, LEFT;  Surgeon: Heloise Purpura, MD;  [facility];  Service: Urology;  Laterality: N/A;   KNEE ARTHROSCOPY  1998    right    LAPAROSCOPIC NEPHRECTOMY Left 10/10/2015   Procedure: LAPAROSCOPIC RADICAL NEPHRECTOMY;  Surgeon: Heloise Purpura, MD;  [facility];  Service: Urology;  Laterality: Left;   SHOULDER SURGERY     Social History   Occupational History   Not on file   Tobacco Use   Smoking status: Former   Smokeless tobacco: Former  Building services engineer status: Never Used  Substance and Sexual Activity   Alcohol use: No   Drug use: No   Sexual activity: Not on file

## 2023-11-18 ENCOUNTER — Ambulatory Visit: Admitting: Physician Assistant

## 2023-11-18 DIAGNOSIS — Z9889 Other specified postprocedural states: Secondary | ICD-10-CM

## 2023-11-18 NOTE — Progress Notes (Signed)
 Post-Op Visit Note   Patient: Douglas Robertson.           Date of Birth: 09-Dec-1965           MRN: [id number] Visit Date: 11/18/2023 PCP: Richardean Chimera, MD   Assessment & Plan:  Chief Complaint:  Chief Complaint  Patient presents with   Left Knee - Routine Post Op         Left knee scope 10/28/2023     Visit Diagnoses:  1. S/P left knee arthroscopy     Plan: Patient is a pleasant 58 year old gentleman who comes in today approximately 3 weeks status post left knee arthroscopic debridement medial meniscus and chondroplasty 10/28/2023.  He has been doing better but still has some weakness and instability specifically with descending stairs.  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  Examination of the left knee reveals a small effusion.  Painless range of motion.  He is neurovascular intact distally.  At this point, I would like for him to continue with strengthening exercises.  I have also offered formal physical therapy but he has politely declined.  He will advance with activity as tolerated.  Continue out of work until 6 weeks postop.  Call with concerns or questions in meantime.  Follow-Up Instructions: Return if symptoms worsen or fail to improve.   Orders:  No orders of the defined types were placed in this encounter.  No orders of the defined types were placed in this encounter.   Imaging: No new imaging  PMFS History: Patient Active Problem List   Diagnosis Date Noted   Complex tear of lateral meniscus of left knee as current injury 10/01/2023   Chondromalacia of knee, left 10/01/2023   Hypervitaminosis 08/30/2023   Class 1 obesity due to excess calories with serious comorbidity and body mass index (BMI) of 33.0 to 33.9 in adult 04/29/2023   Long-term (current) use of injectable non-insulin antidiabetic drugs 04/29/2023   Tear of left rotator cuff 07/04/2018   Internal impingement of left shoulder 01/18/2018   History of colonic  polyps 05/18/2016   Acute bronchitis 01/28/2016   Neoplasm of left kidney 10/10/2015   Gross hematuria 09/18/2015   Biceps tendinitis of both shoulders 10/23/2014   Right anterior knee pain 10/23/2014   Contact dermatitis and other eczema 02/07/2014   Type 2 diabetes mellitus with stage 3a chronic kidney disease, without long-term current use of insulin (HCC) 09/18/2013   Disorder of bursae and tendons in shoulder region 04/12/2013   Left shoulder pain 04/12/2013   Essential hypertension, benign 04/06/2013   Mixed hyperlipidemia 04/06/2013   Routine general medical examination at a health care facility 04/06/2013   Precordial pain 10/01/2011   Past Medical History:  Diagnosis Date   Cancer (HCC)    kidney (L)   Chronic kidney disease 2017   left renal mass   Diabetes mellitus without complication (HCC)    Eczema    Hypertension     Family History  Problem Relation Age of Onset   Colon cancer Neg Hx     Past Surgical History:  Procedure Laterality Date   COLONOSCOPY  2012   COLONOSCOPY N/A 09/03/2016   Procedure: COLONOSCOPY;  Surgeon: Malissa Hippo, MD;  [facility];  Service: Endoscopy;  Laterality: N/A;  730   HYDROCELE EXCISION Left 01/01/2022   Procedure: HYDROCELECTOMY ADULT;  Surgeon: Heloise Purpura, MD;  [facility];  Service: Urology;  Laterality: Left;  ONLY NEEDS 60 MIN   HYDROCELE EXCISION N/A 02/26/2022   Procedure: HYDROCELE DRAINAGE AND REPAIR, LEFT;  Surgeon: Heloise Purpura, MD;  [facility];  Service: Urology;  Laterality: N/A;   KNEE ARTHROSCOPY  1998    right    LAPAROSCOPIC NEPHRECTOMY Left 10/10/2015   Procedure: LAPAROSCOPIC RADICAL NEPHRECTOMY;  Surgeon: Heloise Purpura, MD;  [facility];  Service: Urology;  Laterality: Left;   SHOULDER SURGERY     Social History   Occupational History   Not on file  Tobacco Use   Smoking status: Former   Smokeless tobacco: Former  Building services engineer status: Never Used  Substance and  Sexual Activity   Alcohol use: No   Drug use: No   Sexual activity: Not on file

## 2023-11-29 NOTE — Progress Notes (Unsigned)
 Procedure Note  Patient: Douglas Robertson.             Date of Birth: 03-18-1966           MRN: [id number]             Visit Date: 11/30/2023  Procedures: Visit Diagnoses:  1. S/P left knee arthroscopy     Large Joint Inj: L knee on 11/30/2023 7:16 AM Details: 22 G needle Medications: 2 mL bupivacaine 0.5 %; 2 mL lidocaine 1 %; 40 mg methylPREDNISolone acetate 40 MG/ML Outcome: tolerated well, no immediate complications Patient was prepped and draped in the usual sterile fashion.      Post-Op Visit Note   Patient: Douglas Robertson.           Date of Birth: 02-Sep-1966           MRN: [id number] Visit Date: 11/30/2023 PCP: Richardean Chimera, MD   Assessment & Plan:  Chief Complaint:  Chief Complaint  Patient presents with   Left Knee - Pain   Visit Diagnoses:  1. S/P left knee arthroscopy     Plan: Douglas Robertson is a 58 year old gentleman who is status post left knee scope from February.  He was doing well but recently started having worse symptoms.  He was doing yard work last week and then started to have trouble with weightbearing and activity.  Denies any constitutional symptoms or injuries.  He has trouble with steps.  He feels he has more pain on the lateral side of the knee.  Examination of the left knee shows fully healed surgical scars.  He has a trace joint effusion.  Collaterals and cruciates are stable.  Slight lateral joint line tenderness.  Negative McMurray.  Discussed with probably overdid it with activity.  I recommend a cortisone injection and taking a few weeks off from any strenuous activity for few weeks.  Recommend an outpatient physical therapy referral.  Follow-up if symptoms do not improve.  Follow-Up Instructions: No follow-ups on file.   Orders:  Orders Placed This Encounter  Procedures   Large Joint Inj: L knee   Ambulatory referral to Physical Therapy   No orders of the defined types were placed in this encounter.   Imaging: No results  found.  PMFS History: Patient Active Problem List   Diagnosis Date Noted   Complex tear of lateral meniscus of left knee as current injury 10/01/2023   Chondromalacia of knee, left 10/01/2023   Hypervitaminosis 08/30/2023   Class 1 obesity due to excess calories with serious comorbidity and body mass index (BMI) of 33.0 to 33.9 in adult 04/29/2023   Long-term (current) use of injectable non-insulin antidiabetic drugs 04/29/2023   Tear of left rotator cuff 07/04/2018   Internal impingement of left shoulder 01/18/2018   History of colonic polyps 05/18/2016   Acute bronchitis 01/28/2016   Neoplasm of left kidney 10/10/2015   Gross hematuria 09/18/2015   Biceps tendinitis of both shoulders 10/23/2014   Right anterior knee pain 10/23/2014   Contact dermatitis and other eczema 02/07/2014   Type 2 diabetes mellitus with stage 3a chronic kidney disease, without long-term current use of insulin (HCC) 09/18/2013   Disorder of bursae and tendons in shoulder region 04/12/2013   Left shoulder pain 04/12/2013   Essential hypertension, benign 04/06/2013   Mixed hyperlipidemia 04/06/2013   Routine general medical examination at a health care facility 04/06/2013   Precordial pain 10/01/2011   Past Medical History:  Diagnosis  Date   Cancer [facility])    kidney (L)   Chronic kidney disease 2017   left renal mass   Diabetes mellitus without complication (HCC)    Eczema    Hypertension     Family History  Problem Relation Age of Onset   Colon cancer Neg Hx     Past Surgical History:  Procedure Laterality Date   COLONOSCOPY  2012   COLONOSCOPY N/A 09/03/2016   Procedure: COLONOSCOPY;  Surgeon: Malissa Hippo, MD;  [facility];  Service: Endoscopy;  Laterality: N/A;  730   HYDROCELE EXCISION Left 01/01/2022   Procedure: HYDROCELECTOMY ADULT;  Surgeon: Heloise Purpura, MD;  [facility];  Service: Urology;  Laterality: Left;  ONLY NEEDS 60 MIN   HYDROCELE EXCISION N/A 02/26/2022    Procedure: HYDROCELE DRAINAGE AND REPAIR, LEFT;  Surgeon: Heloise Purpura, MD;  [facility];  Service: Urology;  Laterality: N/A;   KNEE ARTHROSCOPY  1998    right    LAPAROSCOPIC NEPHRECTOMY Left 10/10/2015   Procedure: LAPAROSCOPIC RADICAL NEPHRECTOMY;  Surgeon: Heloise Purpura, MD;  [facility];  Service: Urology;  Laterality: Left;   SHOULDER SURGERY     Social History   Occupational History   Not on file  Tobacco Use   Smoking status: Former   Smokeless tobacco: Former  Building services engineer status: Never Used  Substance and Sexual Activity   Alcohol use: No   Drug use: No   Sexual activity: Not on file

## 2023-11-30 ENCOUNTER — Ambulatory Visit (INDEPENDENT_AMBULATORY_CARE_PROVIDER_SITE_OTHER): Admitting: *Deleted

## 2023-11-30 ENCOUNTER — Ambulatory Visit (INDEPENDENT_AMBULATORY_CARE_PROVIDER_SITE_OTHER): Admitting: Orthopaedic Surgery

## 2023-11-30 DIAGNOSIS — Z9889 Other specified postprocedural states: Secondary | ICD-10-CM

## 2023-11-30 DIAGNOSIS — Z91038 Other insect allergy status: Secondary | ICD-10-CM | POA: Diagnosis not present

## 2023-11-30 DIAGNOSIS — M25562 Pain in left knee: Secondary | ICD-10-CM

## 2023-11-30 MED ORDER — METHYLPREDNISOLONE ACETATE 40 MG/ML IJ SUSP
40.0000 mg | INTRAMUSCULAR | Status: AC | PRN
  Administered 2023-11-30: 40 mg via INTRA_ARTICULAR

## 2023-11-30 MED ORDER — LIDOCAINE HCL 1 % IJ SOLN
2.0000 mL | INTRAMUSCULAR | Status: AC | PRN
  Administered 2023-11-30: 2 mL

## 2023-11-30 MED ORDER — BUPIVACAINE HCL 0.5 % IJ SOLN
2.0000 mL | INTRAMUSCULAR | Status: AC | PRN
  Administered 2023-11-30: 2 mL via INTRA_ARTICULAR

## 2023-12-01 ENCOUNTER — Ambulatory Visit: Payer: 59

## 2023-12-15 ENCOUNTER — Telehealth: Payer: Self-pay | Admitting: Orthopaedic Surgery

## 2023-12-15 NOTE — Telephone Encounter (Signed)
 Called patient. No answer. LMOM that note will be up front and ready for pick up.

## 2023-12-15 NOTE — Telephone Encounter (Signed)
 Pt called requesting return to work note for beginning date 01/04/24 with no restrictions. Please call pt when ready for pick up. Pt phone number is [phone].

## 2023-12-15 NOTE — Telephone Encounter (Signed)
 yes

## 2023-12-17 ENCOUNTER — Encounter (HOSPITAL_COMMUNITY): Payer: Self-pay

## 2023-12-17 ENCOUNTER — Ambulatory Visit (HOSPITAL_COMMUNITY): Attending: Orthopaedic Surgery

## 2023-12-17 ENCOUNTER — Other Ambulatory Visit: Payer: Self-pay

## 2023-12-17 DIAGNOSIS — M25562 Pain in left knee: Secondary | ICD-10-CM | POA: Diagnosis present

## 2023-12-17 DIAGNOSIS — Z9889 Other specified postprocedural states: Secondary | ICD-10-CM | POA: Diagnosis not present

## 2023-12-17 DIAGNOSIS — R29898 Other symptoms and signs involving the musculoskeletal system: Secondary | ICD-10-CM

## 2023-12-17 NOTE — Therapy (Addendum)
 OUTPATIENT PHYSICAL THERAPY LOWER EXTREMITY EVALUATION   Patient Name: Douglas Robertson. MRN: [id number] DOB:29-Jun-1966, 58 y.o., male Today's Date: 12/17/2023  END OF SESSION:  PT End of Session - 12/17/23 1604     Visit Number 1    Date for PT Re-Evaluation 01/06/24    Authorization Type [employer] OTHER    Authorization - Visit Number 1    Progress Note Due on Visit 10    PT Start Time 1145    PT Stop Time 1235    PT Time Calculation (min) 50 min    Activity Tolerance Patient tolerated treatment well    Behavior During Therapy [facility] for tasks assessed/performed             Past Medical History:  Diagnosis Date   Cancer (HCC)    kidney (L)   Chronic kidney disease 2017   left renal mass   Diabetes mellitus without complication (HCC)    Eczema    Hypertension    Past Surgical History:  Procedure Laterality Date   COLONOSCOPY  2012   COLONOSCOPY N/A 09/03/2016   Procedure: COLONOSCOPY;  Surgeon: Malissa Hippo, MD;  Location: [facility];  Service: Endoscopy;  Laterality: N/A;  730   HYDROCELE EXCISION Left 01/01/2022   Procedure: HYDROCELECTOMY ADULT;  Surgeon: Heloise Purpura, MD;  Location: [facility];  Service: Urology;  Laterality: Left;  ONLY NEEDS 60 MIN   HYDROCELE EXCISION N/A 02/26/2022   Procedure: HYDROCELE DRAINAGE AND REPAIR, LEFT;  Surgeon: Heloise Purpura, MD;  Location: [facility];  Service: Urology;  Laterality: N/A;   KNEE ARTHROSCOPY  1998    right    LAPAROSCOPIC NEPHRECTOMY Left 10/10/2015   Procedure: LAPAROSCOPIC RADICAL NEPHRECTOMY;  Surgeon: Heloise Purpura, MD;  Location: [facility];  Service: Urology;  Laterality: Left;   SHOULDER SURGERY     Patient Active Problem List   Diagnosis Date Noted   Complex tear of lateral meniscus of left knee as current injury 10/01/2023   Chondromalacia of knee, left 10/01/2023   Hypervitaminosis 08/30/2023   Class 1 obesity due to excess calories with serious comorbidity and body mass index (BMI) of 33.0 to  33.9 in adult 04/29/2023   Long-term (current) use of injectable non-insulin antidiabetic drugs 04/29/2023   Tear of left rotator cuff 07/04/2018   Internal impingement of left shoulder 01/18/2018   History of colonic polyps 05/18/2016   Acute bronchitis 01/28/2016   Neoplasm of left kidney 10/10/2015   Gross hematuria 09/18/2015   Biceps tendinitis of both shoulders 10/23/2014   Right anterior knee pain 10/23/2014   Contact dermatitis and other eczema 02/07/2014   Type 2 diabetes mellitus with stage 3a chronic kidney disease, without long-term current use of insulin (HCC) 09/18/2013   Disorder of bursae and tendons in shoulder region 04/12/2013   Left shoulder pain 04/12/2013   Essential hypertension, benign 04/06/2013   Mixed hyperlipidemia 04/06/2013   Routine general medical examination at a health care facility 04/06/2013   Precordial pain 10/01/2011    PCP: Richardean Chimera, MD   REFERRING PROVIDER: Tarry Kos, MD  REFERRING DIAG: [phone] (ICD-10-CM) - S/P left knee arthroscopy  THERAPY DIAG:  Acute pain of left knee  Weakness of left lower extremity  Rationale for Evaluation and Treatment: Rehabilitation  ONSET DATE: 10/28/23  SUBJECTIVE:   SUBJECTIVE STATEMENT: Pt states having a L knee cleaning of meniscus and some cartilage earlier this year. Pt reports having tried a brace, ice, and rap to  help knee pain but has not gotten better. Pt is frustrated that the knee has not improved in 7 weeks from the procedure and reports doctors said it is just going to take some time. Pt is ready to get back to work and is set to go back later this month. Pt states that his pain wakes him up when rolling over in bed and states that it pops loudly sometimes.  PERTINENT HISTORY: -R knee scope in 1997 -Left kidney cancer 2017 -Left rotator cuff surgery 2020 PAIN:  Are you having pain? Yes: NPRS scale: 6/10 Pain location: back of Left knee to the front Pain description: unable  to describe Aggravating factors: stairs, giving out, unstable, walk, weight bearing Relieving factors: none  PRECAUTIONS: None  RED FLAGS: None   WEIGHT BEARING RESTRICTIONS: No  FALLS:  Has patient fallen in last 6 months? No  LIVING ENVIRONMENT: Lives with: lives with their spouse Lives in: House/apartment Stairs: Yes: Internal: 15 steps; not reported and External: 2 steps; not reported Has following equipment at home: None  OCCUPATION: Out on leave, works on Sales promotion account executive, Curator  PLOF: Independent, little slower moving  PATIENT GOALS: stairs, walking, get back to normal  NEXT MD VISIT: not reported  OBJECTIVE:  Note: Objective measures were completed at Evaluation unless otherwise noted.  DIAGNOSTIC FINDINGS: CLINICAL DATA:  Left knee pain, difficulty walking for 4-6 weeks   EXAM: MRI OF THE LEFT KNEE WITHOUT CONTRAST   TECHNIQUE: Multiplanar, multisequence MR imaging of the knee was performed. No intravenous contrast was administered.   COMPARISON:  None Available.   FINDINGS: MENISCI   Medial: Intact.   Lateral: Maceration of the anterior horn of the lateral meniscus with peripheral meniscal extrusion.   LIGAMENTS   Cruciates: Intact ACL with mucinous degeneration.  Intact PCL.   Collaterals: Medial collateral ligament is intact. Lateral collateral ligament complex is intact.   CARTILAGE   Patellofemoral: Mild partial thickness cartilage loss of the lateral patellar facet with cartilage fissuring of the patellar apex and lateral patellar facet.   Medial:  No chondral defect.   Lateral: Moderate partial-thickness cartilage loss of the weight-bearing lateral femorotibial compartment with a focal full-thickness chondral defect of the central lateral tibial plateau measuring 5 mm medial-lateral and 12 mm AP with subchondral marrow edema.   JOINT: Large joint effusion. Normal Hoffa's fat-pad. No plical thickening.   POPLITEAL FOSSA: Popliteus  tendon is intact. Small Baker's cyst.   EXTENSOR MECHANISM: Intact quadriceps tendon. Intact patellar tendon. Intact lateral patellar retinaculum. Intact medial patellar retinaculum. Intact MPFL.   BONES: No aggressive osseous lesion. No fracture or dislocation.   Other: No fluid collection or hematoma. Muscles are normal. Severe soft tissue edema in the subcutaneous fat overlying the medial aspect of the knee.   IMPRESSION: 1. Maceration of the anterior horn of the lateral meniscus with peripheral meniscal extrusion. 2. Moderate partial-thickness cartilage loss of the weight-bearing lateral femorotibial compartment with a focal full-thickness chondral defect of the central lateral tibial plateau measuring 5 mm medial-lateral and 12 mm AP with subchondral marrow edema. 3. Mild partial thickness cartilage loss of the lateral patellar facet with cartilage fissuring of the patellar apex and lateral patellar facet. 4. Large joint effusion.  PATIENT SURVEYS:  LEFS 34/80  COGNITION: Overall cognitive status: Within functional limits for tasks assessed     SENSATION: WFL  EDEMA:  None observed  MUSCLE LENGTH: Hamstrings tight during 90/90 test, L side more restricted   PALPATION: Pt has slight tenderness  to touch during tibiofemoral AP and PA glide assessments. Pt well tolerates palpation.  LOWER EXTREMITY ROM:  Active ROM Right eval Left eval  Hip flexion    Hip extension    Hip abduction    Hip adduction    Hip internal rotation    Hip external rotation    Knee flexion 110 109, pain  Knee extension 2 from neutral 9 degrees from neutral, pain   Ankle dorsiflexion    Ankle plantarflexion    Ankle inversion    Ankle eversion     (Blank rows = not tested)  LOWER EXTREMITY MMT:  MMT Right eval Left eval  Hip flexion 4+ 4  Hip extension    Hip abduction 4+ 4  Hip adduction 4+ 4  Hip internal rotation    Hip external rotation    Knee flexion 4 3+  Knee  extension 4 4-  Ankle dorsiflexion    Ankle plantarflexion    Ankle inversion    Ankle eversion     (Blank rows = not tested)   FUNCTIONAL TESTS:  5 times sit to stand: 18.13 2 minute walk test: 407 SLS L: 19.08 R: 30   GAIT: Distance walked: 407 feet Assistive device utilized: None Level of assistance: Complete Independence Comments: decreased gait velocity, decreased stance time on left knee, vaulting compensation noted on RLE                                                                                                                                TREATMENT DATE:  12/17/2023  Evaluation: -ROM measured, Strength assessed, HEP prescribed, pt educated on prognosis, findings, and importance of HEP compliance if given.   PATIENT EDUCATION:  Education details: Pt was educated on findings of PT evaluation, prognosis, frequency of therapy visits and rationale, attendance policy, and HEP if given.   Person educated: Patient Education method: Explanation, Verbal cues, and Handouts Education comprehension: verbalized understanding and needs further education  HOME EXERCISE PROGRAM: Access Code: [email or web address] URL: [email or web address] Date: 12/17/2023 Prepared by: Luz Lex  Exercises - Seated Long Arc Quad  - 1 x daily - 7 x weekly - 3 sets - 10 reps - Sit to Stand with Arms Crossed  - 1 x daily - 7 x weekly - 3 sets - 10 reps - Single Leg Stance  - 1 x daily - 7 x weekly - 3 sets - 10 reps - Seated Hamstring Stretch  - 1 x daily - 7 x weekly - 3 sets - 10 reps  ASSESSMENT:  CLINICAL IMPRESSION: Patient is a 58 y.o. male who was seen today for physical therapy evaluation and treatment for Z98.890 (ICD-10-CM) - S/P left knee arthroscopy.   Patient demonstrates decreased LE strength, abnormal pain rating, and impaired balance. Patient also demonstrates difficulty with ambulation during today's session with decreased stride length on LLE and decreased overall  velocity. Patient also demonstrates decreased extesion ROM of L knee  compared to the RLE. Patient requires education on POC, therapy strategies, and role of therapy in optimal pt care. Patient would benefit from skilled physical therapy for decreased pain, increased endurance with ambulation, increased LE strength, and balance for improved gait quality, return to higher level of function with ADLs, and progress towards therapy goals.   OBJECTIVE IMPAIRMENTS: Abnormal gait, decreased balance, decreased endurance, decreased mobility, difficulty walking, decreased ROM, decreased strength, impaired flexibility, and pain.   ACTIVITY LIMITATIONS: carrying, lifting, bending, standing, squatting, stairs, bed mobility, and locomotion level  PARTICIPATION LIMITATIONS: laundry, driving, shopping, community activity, occupation, and yard work  PERSONAL FACTORS: Age, Past/current experiences, and Time since onset of injury/illness/exacerbation are also affecting patient's functional outcome.   REHAB POTENTIAL: Good  CLINICAL DECISION MAKING: Stable/uncomplicated  EVALUATION COMPLEXITY: Low   GOALS: Return to work [employer] Goals reviewed with patient? No  SHORT TERM GOALS: Target date: 01/07/24  Patient will demonstrate evidence of independence with individualized HEP and will report compliance for at least 3 days per week for optimized progression towards remaining therapy goals. Baseline:  Goal status: INITIAL  2.  Patient will report a decrease in pain level during community ambulation by at least 2 points for improved quality of life. Baseline: 6/10 Goal status: INITIAL     LONG TERM GOALS: Target date: 01/27/24  Pt will demonstrate a an increase of at least 9 points on the LEFS for improved performance of community ambulation and ADL. Baseline: see objective Goal status: INITIAL  2.  Pt will improve 2 MWT by 140 feet in order to demonstrate improved functional ambulatory capacity in  community setting.  Baseline: 407 feet Goal status: INITIAL  3.  Pt will demonstrate WFL ROM (flexion and extension) in left knee compared to the right knee, for increased mobility and maximal efficiency of gait cycle during ambulation. Baseline: see objective Goal status: INITIAL  4.  Pt will demonstrate at least 4-/5 MMT for left lower extremity for increased strength during ADL and community ambulation. Baseline: see objective Goal status: INITIAL  5.  Pt will improve 5TSTS by at least 2 seconds in order to improve functional strenght during functional transfers. Baseline: see obective Goal status: INITIAL    PLAN:  PT FREQUENCY: 2x/week  PT DURATION: 6 weeks  PLANNED INTERVENTIONS: 97110-Therapeutic exercises, 97530- Therapeutic activity, 97112- Neuromuscular re-education, 97535- Self Care, 16109- Manual therapy, [phone]- Gait training, Patient/Family education, Balance training, Stair training, Joint mobilization, Cryotherapy, and Moist heat  PLAN FOR NEXT SESSION: progress LLE strengthening, balance, and dynamic stability exercises to pts tolerance   Luz Lex, PT, DPT [facility] Office: [phone] 4:25 PM, 12/17/23

## 2023-12-20 ENCOUNTER — Encounter (HOSPITAL_COMMUNITY): Payer: Self-pay

## 2023-12-20 ENCOUNTER — Ambulatory Visit (HOSPITAL_COMMUNITY)

## 2023-12-20 DIAGNOSIS — M25562 Pain in left knee: Secondary | ICD-10-CM | POA: Diagnosis not present

## 2023-12-20 DIAGNOSIS — R29898 Other symptoms and signs involving the musculoskeletal system: Secondary | ICD-10-CM

## 2023-12-20 NOTE — Therapy (Signed)
 OUTPATIENT PHYSICAL THERAPY LOWER EXTREMITY EVALUATION   Patient Name: Douglas Robertson. MRN: [id number] DOB:09-16-1965, 58 y.o., male Today's Date: 12/20/2023  END OF SESSION:  PT End of Session - 12/20/23 1008     Visit Number 2    Number of Visits 12    Date for PT Re-Evaluation 01/06/24    Authorization Type [employer] OTHER    Progress Note Due on Visit 10    PT Start Time 1011    PT Stop Time 1053    PT Time Calculation (min) 42 min    Activity Tolerance Patient tolerated treatment well    Behavior During Therapy WFL for tasks assessed/performed             Past Medical History:  Diagnosis Date   Cancer (HCC)    kidney (L)   Chronic kidney disease 2017   left renal mass   Diabetes mellitus without complication (HCC)    Eczema    Hypertension    Past Surgical History:  Procedure Laterality Date   COLONOSCOPY  2012   COLONOSCOPY N/A 09/03/2016   Procedure: COLONOSCOPY;  Surgeon: Ruby Corporal, MD;  Location: [facility];  Service: Endoscopy;  Laterality: N/A;  730   HYDROCELE EXCISION Left 01/01/2022   Procedure: HYDROCELECTOMY ADULT;  Surgeon: Florencio Hunting, MD;  Location: [facility];  Service: Urology;  Laterality: Left;  ONLY NEEDS 60 MIN   HYDROCELE EXCISION N/A 02/26/2022   Procedure: HYDROCELE DRAINAGE AND REPAIR, LEFT;  Surgeon: Florencio Hunting, MD;  Location: [facility];  Service: Urology;  Laterality: N/A;   KNEE ARTHROSCOPY  1998    right    LAPAROSCOPIC NEPHRECTOMY Left 10/10/2015   Procedure: LAPAROSCOPIC RADICAL NEPHRECTOMY;  Surgeon: Florencio Hunting, MD;  Location: [facility];  Service: Urology;  Laterality: Left;   SHOULDER SURGERY     Patient Active Problem List   Diagnosis Date Noted   Complex tear of lateral meniscus of left knee as current injury 10/01/2023   Chondromalacia of knee, left 10/01/2023   Hypervitaminosis 08/30/2023   Class 1 obesity due to excess calories with serious comorbidity and body mass index (BMI) of 33.0 to 33.9 in adult  04/29/2023   Long-term (current) use of injectable non-insulin antidiabetic drugs 04/29/2023   Tear of left rotator cuff 07/04/2018   Internal impingement of left shoulder 01/18/2018   History of colonic polyps 05/18/2016   Acute bronchitis 01/28/2016   Neoplasm of left kidney 10/10/2015   Gross hematuria 09/18/2015   Biceps tendinitis of both shoulders 10/23/2014   Right anterior knee pain 10/23/2014   Contact dermatitis and other eczema 02/07/2014   Type 2 diabetes mellitus with stage 3a chronic kidney disease, without long-term current use of insulin (HCC) 09/18/2013   Disorder of bursae and tendons in shoulder region 04/12/2013   Left shoulder pain 04/12/2013   Essential hypertension, benign 04/06/2013   Mixed hyperlipidemia 04/06/2013   Routine general medical examination at a health care facility 04/06/2013   Precordial pain 10/01/2011    PCP: Leesa Pulling, MD   REFERRING PROVIDER: Wes Hamman, MD  REFERRING DIAG: [phone] (ICD-10-CM) - S/P left knee arthroscopy  THERAPY DIAG:  Acute pain of left knee  Weakness of left lower extremity  Rationale for Evaluation and Treatment: Rehabilitation  ONSET DATE: 10/28/23  SUBJECTIVE:   SUBJECTIVE STATEMENT: Still having hard time with stairs. Still having a popping sometimes when bending knee. Just feels like a discomfort. Will have to get onto knees, walk  a lot, stand ~ 12 hours, and do stairs for work.  Pt states having a L knee cleaning of meniscus and some cartilage earlier this year. Pt reports having tried a brace, ice, and rap to help knee pain but has not gotten better. Pt is frustrated that the knee has not improved in 7 weeks from the procedure and reports doctors said it is just going to take some time. Pt is ready to get back to work and is set to go back later this month. Pt states that his pain wakes him up when rolling over in bed and states that it pops loudly sometimes.  PERTINENT HISTORY: -R knee scope in  1997 -Left kidney cancer 2017 -Left rotator cuff surgery 2020 PAIN:  Are you having pain? Yes: NPRS scale: 6/10 Pain location: back of Left knee to the front Pain description: unable to describe Aggravating factors: stairs, giving out, unstable, walk, weight bearing Relieving factors: none  PRECAUTIONS: None  RED FLAGS: None   WEIGHT BEARING RESTRICTIONS: No  FALLS:  Has patient fallen in last 6 months? No  LIVING ENVIRONMENT: Lives with: lives with their spouse Lives in: House/apartment Stairs: Yes: Internal: 15 steps; not reported and External: 2 steps; not reported Has following equipment at home: None  OCCUPATION: Out on leave, works on Sales promotion account executive, Curator  PLOF: Independent, little slower moving  PATIENT GOALS: stairs, walking, get back to normal  NEXT MD VISIT: not reported  OBJECTIVE:  Note: Objective measures were completed at Evaluation unless otherwise noted.  DIAGNOSTIC FINDINGS: CLINICAL DATA:  Left knee pain, difficulty walking for 4-6 weeks   EXAM: MRI OF THE LEFT KNEE WITHOUT CONTRAST   TECHNIQUE: Multiplanar, multisequence MR imaging of the knee was performed. No intravenous contrast was administered.   COMPARISON:  None Available.   FINDINGS: MENISCI   Medial: Intact.   Lateral: Maceration of the anterior horn of the lateral meniscus with peripheral meniscal extrusion.   LIGAMENTS   Cruciates: Intact ACL with mucinous degeneration.  Intact PCL.   Collaterals: Medial collateral ligament is intact. Lateral collateral ligament complex is intact.   CARTILAGE   Patellofemoral: Mild partial thickness cartilage loss of the lateral patellar facet with cartilage fissuring of the patellar apex and lateral patellar facet.   Medial:  No chondral defect.   Lateral: Moderate partial-thickness cartilage loss of the weight-bearing lateral femorotibial compartment with a focal full-thickness chondral defect of the central lateral tibial  plateau measuring 5 mm medial-lateral and 12 mm AP with subchondral marrow edema.   JOINT: Large joint effusion. Normal Hoffa's fat-pad. No plical thickening.   POPLITEAL FOSSA: Popliteus tendon is intact. Small Baker's cyst.   EXTENSOR MECHANISM: Intact quadriceps tendon. Intact patellar tendon. Intact lateral patellar retinaculum. Intact medial patellar retinaculum. Intact MPFL.   BONES: No aggressive osseous lesion. No fracture or dislocation.   Other: No fluid collection or hematoma. Muscles are normal. Severe soft tissue edema in the subcutaneous fat overlying the medial aspect of the knee.   IMPRESSION: 1. Maceration of the anterior horn of the lateral meniscus with peripheral meniscal extrusion. 2. Moderate partial-thickness cartilage loss of the weight-bearing lateral femorotibial compartment with a focal full-thickness chondral defect of the central lateral tibial plateau measuring 5 mm medial-lateral and 12 mm AP with subchondral marrow edema. 3. Mild partial thickness cartilage loss of the lateral patellar facet with cartilage fissuring of the patellar apex and lateral patellar facet. 4. Large joint effusion.  PATIENT SURVEYS:  LEFS 34/80  COGNITION: Overall cognitive  status: Within functional limits for tasks assessed     SENSATION: WFL  EDEMA:  None observed  MUSCLE LENGTH: Hamstrings tight during 90/90 test, L side more restricted   PALPATION: Pt has slight tenderness to touch during tibiofemoral AP and PA glide assessments. Pt well tolerates palpation.  LOWER EXTREMITY ROM:  Active ROM Right eval Left eval  Hip flexion    Hip extension    Hip abduction    Hip adduction    Hip internal rotation    Hip external rotation    Knee flexion 110 109, pain  Knee extension 2 from neutral 9 degrees from neutral, pain   Ankle dorsiflexion    Ankle plantarflexion    Ankle inversion    Ankle eversion     (Blank rows = not tested)  LOWER EXTREMITY  MMT:  MMT Right eval Left eval  Hip flexion 4+ 4  Hip extension    Hip abduction 4+ 4  Hip adduction 4+ 4  Hip internal rotation    Hip external rotation    Knee flexion 4 3+  Knee extension 4 4-  Ankle dorsiflexion    Ankle plantarflexion    Ankle inversion    Ankle eversion     (Blank rows = not tested)   FUNCTIONAL TESTS:  5 times sit to stand: 18.13 2 minute walk test: 407 SLS L: 19.08 R: 30   GAIT: Distance walked: 407 feet Assistive device utilized: None Level of assistance: Complete Independence Comments: decreased gait velocity, decreased stance time on left knee, vaulting compensation noted on RLE                                                                                                                                TREATMENT DATE:  12/20/23: Review of goals and HEP STS: 10x, no UE support  10x2, w/ YTB, verbal cues to limit inward knee collpase LAQ on LLE only: 10x    10x2, 3 lb. Leg press: 10x plate 4, 4U98 w/ plate 5 Hip ext: YTB, 10x2, bilat. Hip abd: YTB, 10x2, bilat Standing hamstring curl on LLE: YTB, 3x10 Step ups in // bars: 6 inch step, 2x10, leading with LLE, v cues for control  12/17/2023  Evaluation: -ROM measured, Strength assessed, HEP prescribed, pt educated on prognosis, findings, and importance of HEP compliance if given.   PATIENT EDUCATION:  Education details: Pt was educated on findings of PT evaluation, prognosis, frequency of therapy visits and rationale, attendance policy, and HEP if given.   Person educated: Patient Education method: Explanation, Verbal cues, and Handouts Education comprehension: verbalized understanding and needs further education  HOME EXERCISE PROGRAM: Access Code: [email or web address] URL: [email or web address] Date: 12/17/2023 Prepared by: Armond Bertin  Exercises - Seated Long Arc Quad  - 1 x daily - 7 x weekly - 3 sets - 10 reps - Sit to Stand with Arms Crossed  - 1 x daily - 7 x weekly - 3  sets - 10 reps - Single Leg Stance  - 1 x daily - 7 x weekly - 3 sets - 10 reps - Seated Hamstring Stretch  - 1 x daily - 7 x weekly - 3 sets - 10 reps  Access Code: [email or web address] URL: [email or web address] Date: 12/20/2023 Prepared by: Fabiola Backer Powell-Butler  Exercises - Forward Step Up  - 2 x daily - 7 x weekly - 3 sets - 10 reps  ASSESSMENT:  CLINICAL IMPRESSION: Patient tolerated session well. Review of HEP, patient with good carryover of form. Progressed HEP LE strengthening with addition of YTB during STS, and 3 lb. ankle weight during LAQ. Focused on hip, quad, and hamstring strengthening during session. Patient with decreased eccentric control during step ups, fixed with verbal cueing and education. Addition of step ups to HEP. Patient will benefit from continued skilled physical therapy for decreased pain, increased endurance, increased LE strength, and balance training to improve function and QOL.   Patient is a 58 y.o. male who was seen today for physical therapy evaluation and treatment for Z98.890 (ICD-10-CM) - S/P left knee arthroscopy.  Patient demonstrates decreased LE strength, abnormal pain rating, and impaired balance. Patient also demonstrates difficulty with ambulation during today's session with decreased stride length on LLE and decreased overall velocity. Patient also demonstrates decreased extesion ROM of L knee compared to the RLE. Patient requires education on POC, therapy strategies, and role of therapy in optimal pt care. Patient would benefit from skilled physical therapy for decreased pain, increased endurance with ambulation, increased LE strength, and balance for improved gait quality, return to higher level of function with ADLs, and progress towards therapy goals.   OBJECTIVE IMPAIRMENTS: Abnormal gait, decreased balance, decreased endurance, decreased mobility, difficulty walking, decreased ROM, decreased strength, impaired flexibility, and pain.    ACTIVITY LIMITATIONS: carrying, lifting, bending, standing, squatting, stairs, bed mobility, and locomotion level  PARTICIPATION LIMITATIONS: laundry, driving, shopping, community activity, occupation, and yard work  PERSONAL FACTORS: Age, Past/current experiences, and Time since onset of injury/illness/exacerbation are also affecting patient's functional outcome.   REHAB POTENTIAL: Good  CLINICAL DECISION MAKING: Stable/uncomplicated  EVALUATION COMPLEXITY: Low   GOALS: Return to work 29[employer] Goals reviewed with patient? No  SHORT TERM GOALS: Target date: 01/07/24  Patient will demonstrate evidence of independence with individualized HEP and will report compliance for at least 3 days per week for optimized progression towards remaining therapy goals. Baseline:  Goal status: INITIAL  2.  Patient will report a decrease in pain level during community ambulation by at least 2 points for improved quality of life. Baseline: 6/10 Goal status: INITIAL     LONG TERM GOALS: Target date: 01/27/24  Pt will demonstrate a an increase of at least 9 points on the LEFS for improved performance of community ambulation and ADL. Baseline: see objective Goal status: INITIAL  2.  Pt will improve 2 MWT by 140 feet in order to demonstrate improved functional ambulatory capacity in community setting.  Baseline: 407 feet Goal status: INITIAL  3.  Pt will demonstrate WFL ROM (flexion and extension) in left knee compared to the right knee, for increased mobility and maximal efficiency of gait cycle during ambulation. Baseline: see objective Goal status: INITIAL  4.  Pt will demonstrate at least 4-/5 MMT for left lower extremity for increased strength during ADL and community ambulation. Baseline: see objective Goal status: INITIAL  5.  Pt will improve 5TSTS by at least 2 seconds in order to improve functional strenght during functional transfers.  Baseline: see obective Goal status:  INITIAL    PLAN:  PT FREQUENCY: 2x/week  PT DURATION: 6 weeks  PLANNED INTERVENTIONS: 97110-Therapeutic exercises, 97530- Therapeutic activity, [id number]- Neuromuscular re-education, 97535- Self Care, [id number]- Manual therapy, [phone]- Gait training, Patient/Family education, Balance training, Stair training, Joint mobilization, Cryotherapy, and Moist heat  PLAN FOR NEXT SESSION: progress LLE strengthening, balance, and dynamic stability exercises to pts tolerance   10:59 AM, 12/20/23 Javeon Macmurray Powell-Butler, PT, DPT [facility] [place]

## 2023-12-23 ENCOUNTER — Ambulatory Visit (HOSPITAL_COMMUNITY)

## 2023-12-28 ENCOUNTER — Encounter (HOSPITAL_COMMUNITY): Payer: Self-pay

## 2023-12-28 ENCOUNTER — Ambulatory Visit (HOSPITAL_COMMUNITY)

## 2023-12-28 DIAGNOSIS — M25562 Pain in left knee: Secondary | ICD-10-CM | POA: Diagnosis not present

## 2023-12-28 DIAGNOSIS — R29898 Other symptoms and signs involving the musculoskeletal system: Secondary | ICD-10-CM

## 2023-12-28 NOTE — Therapy (Signed)
 OUTPATIENT PHYSICAL THERAPY LOWER EXTREMITY EVALUATION   Patient Name: Douglas Robertson. MRN: [id number] DOB:[birth year], 58 y.o., male Today's Date: 12/28/2023  END OF SESSION:  PT End of Session - 12/28/23 0917     Visit Number 3    Number of Visits 12    Date for PT Re-Evaluation 01/06/24    Authorization Type [employer] OTHER    Progress Note Due on Visit 10    PT Start Time 0930    PT Stop Time 1010    PT Time Calculation (min) 40 min    Activity Tolerance Patient tolerated treatment well    Behavior During Therapy [facility] for tasks assessed/performed              Past Medical History:  Diagnosis Date   Cancer (HCC)    kidney (L)   Chronic kidney disease 2017   left renal mass   Diabetes mellitus without complication (HCC)    Eczema    Hypertension    Past Surgical History:  Procedure Laterality Date   COLONOSCOPY  2012   COLONOSCOPY N/A 09/03/2016   Procedure: COLONOSCOPY;  Surgeon: Ruby Corporal, MD;  Location: [facility];  Service: Endoscopy;  Laterality: N/A;  730   HYDROCELE EXCISION Left 01/01/2022   Procedure: HYDROCELECTOMY ADULT;  Surgeon: Florencio Hunting, MD;  Location: [facility];  Service: Urology;  Laterality: Left;  ONLY NEEDS 60 MIN   HYDROCELE EXCISION N/A 02/26/2022   Procedure: HYDROCELE DRAINAGE AND REPAIR, LEFT;  Surgeon: Florencio Hunting, MD;  Location: [facility];  Service: Urology;  Laterality: N/A;   KNEE ARTHROSCOPY  1998    right    LAPAROSCOPIC NEPHRECTOMY Left 10/10/2015   Procedure: LAPAROSCOPIC RADICAL NEPHRECTOMY;  Surgeon: Florencio Hunting, MD;  Location: [facility];  Service: Urology;  Laterality: Left;   SHOULDER SURGERY     Patient Active Problem List   Diagnosis Date Noted   Complex tear of lateral meniscus of left knee as current injury 10/01/2023   Chondromalacia of knee, left 10/01/2023   Hypervitaminosis 08/30/2023   Class 1 obesity due to excess calories with serious comorbidity and body mass index (BMI) of 33.0 to 33.9 in  adult 04/29/2023   Long-term (current) use of injectable non-insulin antidiabetic drugs 04/29/2023   Tear of left rotator cuff 07/04/2018   Internal impingement of left shoulder 01/18/2018   History of colonic polyps 05/18/2016   Acute bronchitis 01/28/2016   Neoplasm of left kidney 10/10/2015   Gross hematuria 09/18/2015   Biceps tendinitis of both shoulders 10/23/2014   Right anterior knee pain 10/23/2014   Contact dermatitis and other eczema 02/07/2014   Type 2 diabetes mellitus with stage 3a chronic kidney disease, without long-term current use of insulin (HCC) 09/18/2013   Disorder of bursae and tendons in shoulder region 04/12/2013   Left shoulder pain 04/12/2013   Essential hypertension, benign 04/06/2013   Mixed hyperlipidemia 04/06/2013   Routine general medical examination at a health care facility 04/06/2013   Precordial pain 10/01/2011    PCP: Leesa Pulling, MD   REFERRING PROVIDER: Wes Hamman, MD  REFERRING DIAG: [phone] (ICD-10-CM) - S/P left knee arthroscopy  THERAPY DIAG:  Acute pain of left knee  Weakness of left lower extremity  Rationale for Evaluation and Treatment: Rehabilitation  ONSET DATE: 10/28/23  SUBJECTIVE:   SUBJECTIVE STATEMENT: Still having hard time with stairs. Still having a popping sometimes when bending knee. Just feels like a discomfort. Will have to get onto knees,  walk a lot, stand ~ 12 hours, and do stairs for work.  Pt states having a L knee cleaning of meniscus and some cartilage earlier this year. Pt reports having tried a brace, ice, and rap to help knee pain but has not gotten better. Pt is frustrated that the knee has not improved in 7 weeks from the procedure and reports doctors said it is just going to take some time. Pt is ready to get back to work and is set to go back later this month. Pt states that his pain wakes him up when rolling over in bed and states that it pops loudly sometimes.  PERTINENT HISTORY: -R knee  scope in 1997 -Left kidney cancer 2017 -Left rotator cuff surgery 2020 PAIN:  Are you having pain? Yes: NPRS scale: 6/10 Pain location: back of Left knee to the front Pain description: unable to describe Aggravating factors: stairs, giving out, unstable, walk, weight bearing Relieving factors: none  PRECAUTIONS: None  RED FLAGS: None   WEIGHT BEARING RESTRICTIONS: No  FALLS:  Has patient fallen in last 6 months? No  LIVING ENVIRONMENT: Lives with: lives with their spouse Lives in: House/apartment Stairs: Yes: Internal: 15 steps; not reported and External: 2 steps; not reported Has following equipment at home: None  OCCUPATION: Out on leave, works on Sales promotion account executive, Curator  PLOF: Independent, little slower moving  PATIENT GOALS: stairs, walking, get back to normal  NEXT MD VISIT: not reported  OBJECTIVE:  Note: Objective measures were completed at Evaluation unless otherwise noted.  DIAGNOSTIC FINDINGS: CLINICAL DATA:  Left knee pain, difficulty walking for 4-6 weeks   EXAM: MRI OF THE LEFT KNEE WITHOUT CONTRAST   TECHNIQUE: Multiplanar, multisequence MR imaging of the knee was performed. No intravenous contrast was administered.   COMPARISON:  None Available.   FINDINGS: MENISCI   Medial: Intact.   Lateral: Maceration of the anterior horn of the lateral meniscus with peripheral meniscal extrusion.   LIGAMENTS   Cruciates: Intact ACL with mucinous degeneration.  Intact PCL.   Collaterals: Medial collateral ligament is intact. Lateral collateral ligament complex is intact.   CARTILAGE   Patellofemoral: Mild partial thickness cartilage loss of the lateral patellar facet with cartilage fissuring of the patellar apex and lateral patellar facet.   Medial:  No chondral defect.   Lateral: Moderate partial-thickness cartilage loss of the weight-bearing lateral femorotibial compartment with a focal full-thickness chondral defect of the central lateral  tibial plateau measuring 5 mm medial-lateral and 12 mm AP with subchondral marrow edema.   JOINT: Large joint effusion. Normal Hoffa's fat-pad. No plical thickening.   POPLITEAL FOSSA: Popliteus tendon is intact. Small Baker's cyst.   EXTENSOR MECHANISM: Intact quadriceps tendon. Intact patellar tendon. Intact lateral patellar retinaculum. Intact medial patellar retinaculum. Intact MPFL.   BONES: No aggressive osseous lesion. No fracture or dislocation.   Other: No fluid collection or hematoma. Muscles are normal. Severe soft tissue edema in the subcutaneous fat overlying the medial aspect of the knee.   IMPRESSION: 1. Maceration of the anterior horn of the lateral meniscus with peripheral meniscal extrusion. 2. Moderate partial-thickness cartilage loss of the weight-bearing lateral femorotibial compartment with a focal full-thickness chondral defect of the central lateral tibial plateau measuring 5 mm medial-lateral and 12 mm AP with subchondral marrow edema. 3. Mild partial thickness cartilage loss of the lateral patellar facet with cartilage fissuring of the patellar apex and lateral patellar facet. 4. Large joint effusion.  PATIENT SURVEYS:  LEFS 34/80  COGNITION: Overall  cognitive status: Within functional limits for tasks assessed     SENSATION: WFL  EDEMA:  None observed  MUSCLE LENGTH: Hamstrings tight during 90/90 test, L side more restricted   PALPATION: Pt has slight tenderness to touch during tibiofemoral AP and PA glide assessments. Pt well tolerates palpation.  LOWER EXTREMITY ROM:  Active ROM Right eval Left eval  Hip flexion    Hip extension    Hip abduction    Hip adduction    Hip internal rotation    Hip external rotation    Knee flexion 110 109, pain  Knee extension 2 from neutral 9 degrees from neutral, pain   Ankle dorsiflexion    Ankle plantarflexion    Ankle inversion    Ankle eversion     (Blank rows = not tested)  LOWER  EXTREMITY MMT:  MMT Right eval Left eval  Hip flexion 4+ 4  Hip extension    Hip abduction 4+ 4  Hip adduction 4+ 4  Hip internal rotation    Hip external rotation    Knee flexion 4 3+  Knee extension 4 4-  Ankle dorsiflexion    Ankle plantarflexion    Ankle inversion    Ankle eversion     (Blank rows = not tested)   FUNCTIONAL TESTS:  5 times sit to stand: 18.13 2 minute walk test: 407 SLS L: 19.08 R: 30   GAIT: Distance walked: 407 feet Assistive device utilized: None Level of assistance: Complete Independence Comments: decreased gait velocity, decreased stance time on left knee, vaulting compensation noted on RLE                                                                                                                                TREATMENT DATE:  12/28/2023  Therapeutic Exercise: -Stationary bike, 4 minutes, level 3 resistance -Leg press, 3 sets of 10 reps, second sets single LE, bilateral, plate 5 -Banded lateral stepping/monster walks 2 laps 20 feet per lap, second 2 with RTB around ankles, pt cued for upright posture -Forward lunges on to bosu, 1 set of 8 reps better performance going into LLE, pt cued for core activation and upright posture   Therapeutic Activity: -Step up and overs, 1 set of 8 reps, cued for eccentric control -Lateral step up and overs, 1 set of 5 reps, cued for eccentric control -Aeromat walks lateral steps and heel toe stepping, 3 laps in // bars, pt cued for proper foot positioning -Sled pushes, 40 lbs, 60 foot laps, 2 laps, pt reports increased difficulty on LLE going backwards   12/20/23: Review of goals and HEP STS: 10x, no UE support  10x2, w/ YTB, verbal cues to limit inward knee collpase LAQ on LLE only: 10x    10x2, 3 lb. Leg press: 10x plate 4, 0J81 w/ plate 5 Hip ext: YTB, 10x2, bilat. Hip abd: YTB, 10x2, bilat Standing hamstring curl on LLE: YTB, 3x10 Step ups  in // bars: 6 inch step, 2x10, leading with LLE, v cues  for control  12/17/2023  Evaluation: -ROM measured, Strength assessed, HEP prescribed, pt educated on prognosis, findings, and importance of HEP compliance if given.   PATIENT EDUCATION:  Education details: Pt was educated on findings of PT evaluation, prognosis, frequency of therapy visits and rationale, attendance policy, and HEP if given.   Person educated: Patient Education method: Explanation, Verbal cues, and Handouts Education comprehension: verbalized understanding and needs further education  HOME EXERCISE PROGRAM: Access Code: [email or web address] URL: [email or web address] Date: 12/17/2023 Prepared by: Armond Bertin  Exercises - Seated Long Arc Quad  - 1 x daily - 7 x weekly - 3 sets - 10 reps - Sit to Stand with Arms Crossed  - 1 x daily - 7 x weekly - 3 sets - 10 reps - Single Leg Stance  - 1 x daily - 7 x weekly - 3 sets - 10 reps - Seated Hamstring Stretch  - 1 x daily - 7 x weekly - 3 sets - 10 reps  Access Code: [email or web address] URL: [email or web address] Date: 12/20/2023 Prepared by: Virgia Griffins Powell-Butler  Exercises - Forward Step Up  - 2 x daily - 7 x weekly - 3 sets - 10 reps  ASSESSMENT:  CLINICAL IMPRESSION: Patient continues to demonstrate decreased LLE strength, decreased gait quality and SLS activities on LLE. Patient also demonstrates ability to complete dynamic and progressed resistance activities today with no signs of increased discomfort. Patient able to initiate progress dynamic balance and core activation exercises today with sled push/pull, forward lunges with good performance with verbal cueing. Patient would continue to benefit from skilled physical therapy for increased endurance with SLS on LLE, increased LLE strength, and improved balance for improved quality of life, return to higher level of function and continued progress towards therapy goals.   Patient is a 58 y.o. male who was seen today for physical therapy evaluation and  treatment for Z98.890 (ICD-10-CM) - S/P left knee arthroscopy.  Patient demonstrates decreased LE strength, abnormal pain rating, and impaired balance. Patient also demonstrates difficulty with ambulation during today's session with decreased stride length on LLE and decreased overall velocity. Patient also demonstrates decreased extesion ROM of L knee compared to the RLE. Patient requires education on POC, therapy strategies, and role of therapy in optimal pt care. Patient would benefit from skilled physical therapy for decreased pain, increased endurance with ambulation, increased LE strength, and balance for improved gait quality, return to higher level of function with ADLs, and progress towards therapy goals.   OBJECTIVE IMPAIRMENTS: Abnormal gait, decreased balance, decreased endurance, decreased mobility, difficulty walking, decreased ROM, decreased strength, impaired flexibility, and pain.   ACTIVITY LIMITATIONS: carrying, lifting, bending, standing, squatting, stairs, bed mobility, and locomotion level  PARTICIPATION LIMITATIONS: laundry, driving, shopping, community activity, occupation, and yard work  PERSONAL FACTORS: Age, Past/current experiences, and Time since onset of injury/illness/exacerbation are also affecting patient's functional outcome.   REHAB POTENTIAL: Good  CLINICAL DECISION MAKING: Stable/uncomplicated  EVALUATION COMPLEXITY: Low   GOALS: Return to work [employer] Goals reviewed with patient? No  SHORT TERM GOALS: Target date: 01/07/24  Patient will demonstrate evidence of independence with individualized HEP and will report compliance for at least 3 days per week for optimized progression towards remaining therapy goals. Baseline:  Goal status: INITIAL  2.  Patient will report a decrease in pain level during community ambulation by at least 2 points for improved quality of life. Baseline: 6/10 Goal status: INITIAL  LONG TERM GOALS: Target date:  01/27/24  Pt will demonstrate a an increase of at least 9 points on the LEFS for improved performance of community ambulation and ADL. Baseline: see objective Goal status: INITIAL  2.  Pt will improve 2 MWT by 140 feet in order to demonstrate improved functional ambulatory capacity in community setting.  Baseline: 407 feet Goal status: INITIAL  3.  Pt will demonstrate WFL ROM (flexion and extension) in left knee compared to the right knee, for increased mobility and maximal efficiency of gait cycle during ambulation. Baseline: see objective Goal status: INITIAL  4.  Pt will demonstrate at least 4-/5 MMT for left lower extremity for increased strength during ADL and community ambulation. Baseline: see objective Goal status: INITIAL  5.  Pt will improve 5TSTS by at least 2 seconds in order to improve functional strenght during functional transfers. Baseline: see obective Goal status: INITIAL    PLAN:  PT FREQUENCY: 2x/week  PT DURATION: 6 weeks  PLANNED INTERVENTIONS: 97110-Therapeutic exercises, 97530- Therapeutic activity, 97112- Neuromuscular re-education, 97535- Self Care, [id number]- Manual therapy, [phone]- Gait training, Patient/Family education, Balance training, Stair training, Joint mobilization, Cryotherapy, and Moist heat  PLAN FOR NEXT SESSION: progress LLE strengthening, balance, and dynamic stability exercises to pts tolerance   Armond Bertin, PT, DPT [facility] Office: [phone] 10:19 AM, 12/28/23

## 2023-12-30 ENCOUNTER — Ambulatory Visit: Payer: 59 | Admitting: Nurse Practitioner

## 2024-01-03 ENCOUNTER — Other Ambulatory Visit: Payer: Self-pay | Admitting: "Endocrinology

## 2024-01-03 ENCOUNTER — Ambulatory Visit (HOSPITAL_COMMUNITY): Admitting: Physical Therapy

## 2024-01-03 DIAGNOSIS — M25562 Pain in left knee: Secondary | ICD-10-CM

## 2024-01-03 DIAGNOSIS — R29898 Other symptoms and signs involving the musculoskeletal system: Secondary | ICD-10-CM

## 2024-01-03 NOTE — Therapy (Signed)
 OUTPATIENT PHYSICAL THERAPY TREATMENT   Patient Name: Douglas Robertson. MRN: [id number] DOB:[birth year], 58 y.o., male Today's Date: 01/03/2024  END OF SESSION:  PT End of Session - 01/03/24 1144     Visit Number 4    Number of Visits 12    Date for PT Re-Evaluation 01/06/24    Authorization Type [employer] OTHER    Progress Note Due on Visit 10    PT Start Time 1145    PT Stop Time 1225    PT Time Calculation (min) 40 min    Activity Tolerance Patient tolerated treatment well    Behavior During Therapy WFL for tasks assessed/performed               Past Medical History:  Diagnosis Date   Cancer (HCC)    kidney (L)   Chronic kidney disease 2017   left renal mass   Diabetes mellitus without complication (HCC)    Eczema    Hypertension    Past Surgical History:  Procedure Laterality Date   COLONOSCOPY  2012   COLONOSCOPY N/A 09/03/2016   Procedure: COLONOSCOPY;  Surgeon: Ruby Corporal, MD;  Location: [facility];  Service: Endoscopy;  Laterality: N/A;  730   HYDROCELE EXCISION Left 01/01/2022   Procedure: HYDROCELECTOMY ADULT;  Surgeon: Florencio Hunting, MD;  Location: [facility];  Service: Urology;  Laterality: Left;  ONLY NEEDS 60 MIN   HYDROCELE EXCISION N/A 02/26/2022   Procedure: HYDROCELE DRAINAGE AND REPAIR, LEFT;  Surgeon: Florencio Hunting, MD;  Location: [facility];  Service: Urology;  Laterality: N/A;   KNEE ARTHROSCOPY  1998    right    LAPAROSCOPIC NEPHRECTOMY Left 10/10/2015   Procedure: LAPAROSCOPIC RADICAL NEPHRECTOMY;  Surgeon: Florencio Hunting, MD;  Location: [facility];  Service: Urology;  Laterality: Left;   SHOULDER SURGERY     Patient Active Problem List   Diagnosis Date Noted   Complex tear of lateral meniscus of left knee as current injury 10/01/2023   Chondromalacia of knee, left 10/01/2023   Hypervitaminosis 08/30/2023   Class 1 obesity due to excess calories with serious comorbidity and body mass index (BMI) of 33.0 to 33.9 in adult 04/29/2023    Long-term (current) use of injectable non-insulin antidiabetic drugs 04/29/2023   Tear of left rotator cuff 07/04/2018   Internal impingement of left shoulder 01/18/2018   History of colonic polyps 05/18/2016   Acute bronchitis 01/28/2016   Neoplasm of left kidney 10/10/2015   Gross hematuria 09/18/2015   Biceps tendinitis of both shoulders 10/23/2014   Right anterior knee pain 10/23/2014   Contact dermatitis and other eczema 02/07/2014   Type 2 diabetes mellitus with stage 3a chronic kidney disease, without long-term current use of insulin (HCC) 09/18/2013   Disorder of bursae and tendons in shoulder region 04/12/2013   Left shoulder pain 04/12/2013   Essential hypertension, benign 04/06/2013   Mixed hyperlipidemia 04/06/2013   Routine general medical examination at a health care facility 04/06/2013   Precordial pain 10/01/2011    PCP: Leesa Pulling, MD   REFERRING PROVIDER: Wes Hamman, MD  REFERRING DIAG: [phone] (ICD-10-CM) - S/P left knee arthroscopy  THERAPY DIAG:  Acute pain of left knee  Weakness of left lower extremity  Rationale for Evaluation and Treatment: Rehabilitation  ONSET DATE: 10/28/23  SUBJECTIVE:   SUBJECTIVE STATEMENT: Pt states his knee feels stiff like it needs to pop.  Stairs most challenging.  Going back Wednesday to work.  Works 12 hour shifts  in various positions.  Pt states having a L knee cleaning of meniscus and some cartilage earlier this year. Pt reports having tried a brace, ice, and rap to help knee pain but has not gotten better. Pt is frustrated that the knee has not improved in 7 weeks from the procedure and reports doctors said it is just going to take some time. Pt is ready to get back to work and is set to go back later this month. Pt states that his pain wakes him up when rolling over in bed and states that it pops loudly sometimes.  PERTINENT HISTORY: -R knee scope in 1997 -Left kidney cancer 2017 -Left rotator cuff surgery  2020 PAIN:  Are you having pain? Yes: NPRS scale: 6/10 Pain location: back of Left knee to the front Pain description: unable to describe Aggravating factors: stairs, giving out, unstable, walk, weight bearing Relieving factors: none  PRECAUTIONS: None  RED FLAGS: None   WEIGHT BEARING RESTRICTIONS: No  FALLS:  Has patient fallen in last 6 months? No  LIVING ENVIRONMENT: Lives with: lives with their spouse Lives in: House/apartment Stairs: Yes: Internal: 15 steps; not reported and External: 2 steps; not reported Has following equipment at home: None  OCCUPATION: Out on leave, works on Sales promotion account executive, Curator  PLOF: Independent, little slower moving  PATIENT GOALS: stairs, walking, get back to normal  NEXT MD VISIT: not reported  OBJECTIVE:  Note: Objective measures were completed at Evaluation unless otherwise noted.  DIAGNOSTIC FINDINGS: CLINICAL DATA:  Left knee pain, difficulty walking for 4-6 weeks   EXAM: MRI OF THE LEFT KNEE WITHOUT CONTRAST   TECHNIQUE: Multiplanar, multisequence MR imaging of the knee was performed. No intravenous contrast was administered.   COMPARISON:  None Available.   FINDINGS: MENISCI   Medial: Intact.   Lateral: Maceration of the anterior horn of the lateral meniscus with peripheral meniscal extrusion.   LIGAMENTS   Cruciates: Intact ACL with mucinous degeneration.  Intact PCL.   Collaterals: Medial collateral ligament is intact. Lateral collateral ligament complex is intact.   CARTILAGE   Patellofemoral: Mild partial thickness cartilage loss of the lateral patellar facet with cartilage fissuring of the patellar apex and lateral patellar facet.   Medial:  No chondral defect.   Lateral: Moderate partial-thickness cartilage loss of the weight-bearing lateral femorotibial compartment with a focal full-thickness chondral defect of the central lateral tibial plateau measuring 5 mm medial-lateral and 12 mm AP with  subchondral marrow edema.   JOINT: Large joint effusion. Normal Hoffa's fat-pad. No plical thickening.   POPLITEAL FOSSA: Popliteus tendon is intact. Small Baker's cyst.   EXTENSOR MECHANISM: Intact quadriceps tendon. Intact patellar tendon. Intact lateral patellar retinaculum. Intact medial patellar retinaculum. Intact MPFL.   BONES: No aggressive osseous lesion. No fracture or dislocation.   Other: No fluid collection or hematoma. Muscles are normal. Severe soft tissue edema in the subcutaneous fat overlying the medial aspect of the knee.   IMPRESSION: 1. Maceration of the anterior horn of the lateral meniscus with peripheral meniscal extrusion. 2. Moderate partial-thickness cartilage loss of the weight-bearing lateral femorotibial compartment with a focal full-thickness chondral defect of the central lateral tibial plateau measuring 5 mm medial-lateral and 12 mm AP with subchondral marrow edema. 3. Mild partial thickness cartilage loss of the lateral patellar facet with cartilage fissuring of the patellar apex and lateral patellar facet. 4. Large joint effusion.  PATIENT SURVEYS:  LEFS 34/80  COGNITION: Overall cognitive status: Within functional limits for tasks assessed  SENSATION: WFL  EDEMA:  None observed  MUSCLE LENGTH: Hamstrings tight during 90/90 test, L side more restricted   PALPATION: Pt has slight tenderness to touch during tibiofemoral AP and PA glide assessments. Pt well tolerates palpation.  LOWER EXTREMITY ROM:  Active ROM Right eval Left eval  Hip flexion    Hip extension    Hip abduction    Hip adduction    Hip internal rotation    Hip external rotation    Knee flexion 110 109, pain  Knee extension 2 from neutral 9 degrees from neutral, pain   Ankle dorsiflexion    Ankle plantarflexion    Ankle inversion    Ankle eversion     (Blank rows = not tested)  LOWER EXTREMITY MMT:  MMT Right eval Left eval  Hip flexion 4+ 4   Hip extension    Hip abduction 4+ 4  Hip adduction 4+ 4  Hip internal rotation    Hip external rotation    Knee flexion 4 3+  Knee extension 4 4-  Ankle dorsiflexion    Ankle plantarflexion    Ankle inversion    Ankle eversion     (Blank rows = not tested)   FUNCTIONAL TESTS:  5 times sit to stand: 18.13 2 minute walk test: 407 SLS L: 19.08 R: 30   GAIT: Distance walked: 407 feet Assistive device utilized: None Level of assistance: Complete Independence Comments: decreased gait velocity, decreased stance time on left knee, vaulting compensation noted on RLE                                                                                                                                TREATMENT DATE:  01/03/24 Bike seat 9 level 3, 5 minutes Standing:  forward lunges onto BOSU ball no UE 2X10  Forward step downs Lt on step 10X with 1-2 HHA  Lateral step downs Lt on step 10X with 1-2 HHA GTB lateral stepping 20 feet 2RT GTB monster walks 20 feet 2RT  Leg press 2X10 bil LE 4Pl  Leg press 2X10 Lt only 2 PL Long sitting hamstring stretch 4X20" Scar massage to lateral knee/scar  12/28/2023  Therapeutic Exercise: -Stationary bike, 4 minutes, level 3 resistance -Leg press, 3 sets of 10 reps, second sets single LE, bilateral, plate 5 -Banded lateral stepping/monster walks 2 laps 20 feet per lap, second 2 with RTB around ankles, pt cued for upright posture -Forward lunges on to bosu, 1 set of 8 reps better performance going into LLE, pt cued for core activation and upright posture   Therapeutic Activity: -Step up and overs, 1 set of 8 reps, cued for eccentric control -Lateral step up and overs, 1 set of 5 reps, cued for eccentric control -Aeromat walks lateral steps and heel toe stepping, 3 laps in // bars, pt cued for proper foot positioning -Sled pushes, 40 lbs, 60 foot laps, 2 laps, pt reports increased difficulty on  LLE going backwards   12/20/23: Review of goals and  HEP STS: 10x, no UE support  10x2, w/ YTB, verbal cues to limit inward knee collpase LAQ on LLE only: 10x    10x2, 3 lb. Leg press: 10x plate 4, 1O10 w/ plate 5 Hip ext: YTB, 10x2, bilat. Hip abd: YTB, 10x2, bilat Standing hamstring curl on LLE: YTB, 3x10 Step ups in // bars: 6 inch step, 2x10, leading with LLE, v cues for control  12/17/2023  Evaluation: -ROM measured, Strength assessed, HEP prescribed, pt educated on prognosis, findings, and importance of HEP compliance if given.   PATIENT EDUCATION:  Education details: Pt was educated on findings of PT evaluation, prognosis, frequency of therapy visits and rationale, attendance policy, and HEP if given.   Person educated: Patient Education method: Explanation, Verbal cues, and Handouts Education comprehension: verbalized understanding and needs further education  HOME EXERCISE PROGRAM: Access Code: [email or web address] URL: [email or web address] Date: 12/17/2023 Prepared by: Armond Bertin  Exercises - Seated Long Arc Quad  - 1 x daily - 7 x weekly - 3 sets - 10 reps - Sit to Stand with Arms Crossed  - 1 x daily - 7 x weekly - 3 sets - 10 reps - Single Leg Stance  - 1 x daily - 7 x weekly - 3 sets - 10 reps - Seated Hamstring Stretch  - 1 x daily - 7 x weekly - 3 sets - 10 reps  Access Code: [email or web address] URL: [email or web address] Date: 12/20/2023 Prepared by: Virgia Griffins Powell-Butler  Exercises - Forward Step Up  - 2 x daily - 7 x weekly - 3 sets - 10 reps  ASSESSMENT:  CLINICAL IMPRESSION: Warmed up on bike with no difficulty completing full ROM/no pain.  Focus remained on improving LLE strength, gait quality and stability. PT with initial difficulty completing lunges, however improved after form corrections.  PT was able to complete these without UE assist.  Added eccentric strengthening using 4" step with good control overall and only minimal discomfort.  Cues especially with forward step downs as initially  presented with little control.  Did have to use bil UE's to come back onto step during this task due to discomfort. Pt instructed with scar massage for lateral knee and therapist noted extreme lateral hamstring rightness.  Shown stretch in long sitting with good form and stretch obtaining.  Cues to extend hold time when completing. Pt will continue to benefit from skilled physical therapy for increased endurance with SLS on LLE, increased LLE strength, and improved balance for improved quality of life, return to higher level of function and continued progress towards therapy goals.  Evaluation: Patient is a 58 y.o. male who was seen today for physical therapy evaluation and treatment for Z98.890 (ICD-10-CM) - S/P left knee arthroscopy.  Patient demonstrates decreased LE strength, abnormal pain rating, and impaired balance. Patient also demonstrates difficulty with ambulation during today's session with decreased stride length on LLE and decreased overall velocity. Patient also demonstrates decreased extesion ROM of L knee compared to the RLE. Patient requires education on POC, therapy strategies, and role of therapy in optimal pt care. Patient would benefit from skilled physical therapy for decreased pain, increased endurance with ambulation, increased LE strength, and balance for improved gait quality, return to higher level of function with ADLs, and progress towards therapy goals.   OBJECTIVE IMPAIRMENTS: Abnormal gait, decreased balance, decreased endurance, decreased mobility, difficulty walking, decreased ROM, decreased strength, impaired flexibility, and pain.   ACTIVITY LIMITATIONS: carrying, lifting, bending,  standing, squatting, stairs, bed mobility, and locomotion level  PARTICIPATION LIMITATIONS: laundry, driving, shopping, community activity, occupation, and yard work  PERSONAL FACTORS: Age, Past/current experiences, and Time since onset of injury/illness/exacerbation are also affecting  patient's functional outcome.   REHAB POTENTIAL: Good  CLINICAL DECISION MAKING: Stable/uncomplicated  EVALUATION COMPLEXITY: Low   GOALS: Return to work 29[employer] Goals reviewed with patient? No  SHORT TERM GOALS: Target date: 01/07/24  Patient will demonstrate evidence of independence with individualized HEP and will report compliance for at least 3 days per week for optimized progression towards remaining therapy goals. Baseline:  Goal status: INITIAL  2.  Patient will report a decrease in pain level during community ambulation by at least 2 points for improved quality of life. Baseline: 6/10 Goal status: INITIAL     LONG TERM GOALS: Target date: 01/27/24  Pt will demonstrate a an increase of at least 9 points on the LEFS for improved performance of community ambulation and ADL. Baseline: see objective Goal status: INITIAL  2.  Pt will improve 2 MWT by 140 feet in order to demonstrate improved functional ambulatory capacity in community setting.  Baseline: 407 feet Goal status: INITIAL  3.  Pt will demonstrate WFL ROM (flexion and extension) in left knee compared to the right knee, for increased mobility and maximal efficiency of gait cycle during ambulation. Baseline: see objective Goal status: INITIAL  4.  Pt will demonstrate at least 4-/5 MMT for left lower extremity for increased strength during ADL and community ambulation. Baseline: see objective Goal status: INITIAL  5.  Pt will improve 5TSTS by at least 2 seconds in order to improve functional strenght during functional transfers. Baseline: see obective Goal status: INITIAL    PLAN:  PT FREQUENCY: 2x/week  PT DURATION: 6 weeks  PLANNED INTERVENTIONS: 97110-Therapeutic exercises, 97530- Therapeutic activity, 97112- Neuromuscular re-education, 97535- Self Care, [id number]- Manual therapy, [phone]- Gait training, Patient/Family education, Balance training, Stair training, Joint mobilization, Cryotherapy, and Moist  heat  PLAN FOR NEXT SESSION: progress LLE strengthening, balance, and dynamic stability exercises to pts tolerance   Lorenso Romance, PTA/CLT [facility] Ph: [phone] 11:44 AM, 01/03/24

## 2024-01-12 ENCOUNTER — Ambulatory Visit (INDEPENDENT_AMBULATORY_CARE_PROVIDER_SITE_OTHER): Payer: Self-pay

## 2024-01-12 ENCOUNTER — Ambulatory Visit (HOSPITAL_COMMUNITY): Attending: Orthopaedic Surgery | Admitting: Physical Therapy

## 2024-01-12 DIAGNOSIS — M25562 Pain in left knee: Secondary | ICD-10-CM

## 2024-01-12 DIAGNOSIS — R29898 Other symptoms and signs involving the musculoskeletal system: Secondary | ICD-10-CM | POA: Diagnosis present

## 2024-01-12 DIAGNOSIS — Z91038 Other insect allergy status: Secondary | ICD-10-CM | POA: Diagnosis not present

## 2024-01-12 NOTE — Therapy (Addendum)
 OUTPATIENT PHYSICAL THERAPY TREATMENT/progress note/discharge  PHYSICAL THERAPY DISCHARGE SUMMARY  Visits from Start of Care: 4  Current functional level related to goals / functional outcomes: See below   Remaining deficits: See below   Education / Equipment: See below   Patient agrees to discharge. Patient goals were all met except for 1 remaining goal. Patient is being discharged due to meeting the stated rehab goals.  Patient Name: Douglas Robertson. MRN: [id number] DOB:Feb 04, 1966, 59 y.o., male Today's Date: 01/12/2024  END OF SESSION:  PT End of Session - 01/12/24 1300     Visit Number 5    Number of Visits 12    Date for PT Re-Evaluation 01/06/24    Authorization Type [employer] OTHER    Progress Note Due on Visit 10    PT Start Time 1302    PT Stop Time 1348    PT Time Calculation (min) 46 min    Activity Tolerance Patient tolerated treatment well    Behavior During Therapy WFL for tasks assessed/performed               Past Medical History:  Diagnosis Date   Cancer (HCC)    kidney (L)   Chronic kidney disease 2017   left renal mass   Diabetes mellitus without complication (HCC)    Eczema    Hypertension    Past Surgical History:  Procedure Laterality Date   COLONOSCOPY  2012   COLONOSCOPY N/A 09/03/2016   Procedure: COLONOSCOPY;  Surgeon: Ruby Corporal, MD;  Location: [facility];  Service: Endoscopy;  Laterality: N/A;  730   HYDROCELE EXCISION Left 01/01/2022   Procedure: HYDROCELECTOMY ADULT;  Surgeon: Florencio Hunting, MD;  Location: [facility];  Service: Urology;  Laterality: Left;  ONLY NEEDS 60 MIN   HYDROCELE EXCISION N/A 02/26/2022   Procedure: HYDROCELE DRAINAGE AND REPAIR, LEFT;  Surgeon: Florencio Hunting, MD;  Location: [facility];  Service: Urology;  Laterality: N/A;   KNEE ARTHROSCOPY  1998    right    LAPAROSCOPIC NEPHRECTOMY Left 10/10/2015   Procedure: LAPAROSCOPIC RADICAL NEPHRECTOMY;  Surgeon: Florencio Hunting, MD;  Location: [facility];   Service: Urology;  Laterality: Left;   SHOULDER SURGERY     Patient Active Problem List   Diagnosis Date Noted   Complex tear of lateral meniscus of left knee as current injury 10/01/2023   Chondromalacia of knee, left 10/01/2023   Hypervitaminosis 08/30/2023   Class 1 obesity due to excess calories with serious comorbidity and body mass index (BMI) of 33.0 to 33.9 in adult 04/29/2023   Long-term (current) use of injectable non-insulin antidiabetic drugs 04/29/2023   Tear of left rotator cuff 07/04/2018   Internal impingement of left shoulder 01/18/2018   History of colonic polyps 05/18/2016   Acute bronchitis 01/28/2016   Neoplasm of left kidney 10/10/2015   Gross hematuria 09/18/2015   Biceps tendinitis of both shoulders 10/23/2014   Right anterior knee pain 10/23/2014   Contact dermatitis and other eczema 02/07/2014   Type 2 diabetes mellitus with stage 3a chronic kidney disease, without long-term current use of insulin (HCC) 09/18/2013   Disorder of bursae and tendons in shoulder region 04/12/2013   Left shoulder pain 04/12/2013   Essential hypertension, benign 04/06/2013   Mixed hyperlipidemia 04/06/2013   Routine general medical examination at a health care facility 04/06/2013   Precordial pain 10/01/2011    PCP: Leesa Pulling, MD   REFERRING PROVIDER: Wes Hamman, MD  REFERRING DIAG: [phone] (  ICD-10-CM) - S/P left knee arthroscopy  THERAPY DIAG:  Acute pain of left knee  Weakness of left lower extremity  Rationale for Evaluation and Treatment: Rehabilitation  ONSET DATE: 10/28/23  SUBJECTIVE:   SUBJECTIVE STATEMENT: Pt states he still has constant pain in Lt knee 3/10. Pain varies with highest 5/10.  No pain at rest but when gets up down the back of Lt knee and laterally. Pt reports frustration.  Still with hip pain also that began when had his knee surgery.  Back at work but the pain in knee is there.  Pt states having a L knee cleaning of meniscus and  some cartilage earlier this year. Pt reports having tried a brace, ice, and rap to help knee pain but has not gotten better. Pt is frustrated that the knee has not improved in 7 weeks from the procedure and reports doctors said it is just going to take some time. Pt is ready to get back to work and is set to go back later this month. Pt states that his pain wakes him up when rolling over in bed and states that it pops loudly sometimes.  PERTINENT HISTORY: -R knee scope in 1997 -Left kidney cancer 2017 -Left rotator cuff surgery 2020 PAIN:  Are you having pain? Yes: NPRS scale: 3/10 Pain location: back of Left knee to the front Pain description: unable to describe Aggravating factors: stairs, giving out, unstable, walk, weight bearing Relieving factors: none  PRECAUTIONS: None  RED FLAGS: None   WEIGHT BEARING RESTRICTIONS: No  FALLS:  Has patient fallen in last 6 months? No  LIVING ENVIRONMENT: Lives with: lives with their spouse Lives in: House/apartment Stairs: Yes: Internal: 15 steps; not reported and External: 2 steps; not reported Has following equipment at home: None  OCCUPATION: Out on leave, works on Sales promotion account executive, Curator  PLOF: Independent, little slower moving  PATIENT GOALS: stairs, walking, get back to normal  NEXT MD VISIT: not reported  OBJECTIVE:  Note: Objective measures were completed at Evaluation unless otherwise noted.  DIAGNOSTIC FINDINGS: CLINICAL DATA:  Left knee pain, difficulty walking for 4-6 weeks   EXAM: MRI OF THE LEFT KNEE WITHOUT CONTRAST   TECHNIQUE: Multiplanar, multisequence MR imaging of the knee was performed. No intravenous contrast was administered.   COMPARISON:  None Available.   FINDINGS: MENISCI   Medial: Intact.   Lateral: Maceration of the anterior horn of the lateral meniscus with peripheral meniscal extrusion.   LIGAMENTS   Cruciates: Intact ACL with mucinous degeneration.  Intact PCL.   Collaterals: Medial  collateral ligament is intact. Lateral collateral ligament complex is intact.   CARTILAGE   Patellofemoral: Mild partial thickness cartilage loss of the lateral patellar facet with cartilage fissuring of the patellar apex and lateral patellar facet.   Medial:  No chondral defect.   Lateral: Moderate partial-thickness cartilage loss of the weight-bearing lateral femorotibial compartment with a focal full-thickness chondral defect of the central lateral tibial plateau measuring 5 mm medial-lateral and 12 mm AP with subchondral marrow edema.   JOINT: Large joint effusion. Normal Hoffa's fat-pad. No plical thickening.   POPLITEAL FOSSA: Popliteus tendon is intact. Small Baker's cyst.   EXTENSOR MECHANISM: Intact quadriceps tendon. Intact patellar tendon. Intact lateral patellar retinaculum. Intact medial patellar retinaculum. Intact MPFL.   BONES: No aggressive osseous lesion. No fracture or dislocation.   Other: No fluid collection or hematoma. Muscles are normal. Severe soft tissue edema in the subcutaneous fat overlying the medial aspect of the knee.  IMPRESSION: 1. Maceration of the anterior horn of the lateral meniscus with peripheral meniscal extrusion. 2. Moderate partial-thickness cartilage loss of the weight-bearing lateral femorotibial compartment with a focal full-thickness chondral defect of the central lateral tibial plateau measuring 5 mm medial-lateral and 12 mm AP with subchondral marrow edema. 3. Mild partial thickness cartilage loss of the lateral patellar facet with cartilage fissuring of the patellar apex and lateral patellar facet. 4. Large joint effusion.  PATIENT SURVEYS:  LEFS 34/80  COGNITION: Overall cognitive status: Within functional limits for tasks assessed     SENSATION: WFL  EDEMA:  None observed  MUSCLE LENGTH: Hamstrings tight during 90/90 test, L side more restricted   PALPATION: Pt has slight tenderness to touch during  tibiofemoral AP and PA glide assessments. Pt well tolerates palpation.  LOWER EXTREMITY ROM:  Active ROM Right eval Left eval Right   Hip flexion      Hip extension      Hip abduction      Hip adduction      Hip internal rotation      Hip external rotation      Knee flexion 110 109, pain 135 125  Knee extension 2 from neutral 9 degrees from neutral, pain  0 0  Ankle dorsiflexion      Ankle plantarflexion      Ankle inversion      Ankle eversion       (Blank rows = not tested)  LOWER EXTREMITY MMT:  MMT Right eval Left eval Right 01/12/24 Left 01/12/24  Hip flexion 4+ 4 5 5   Hip extension   5 5  Hip abduction 4+ 4 5 5   Hip adduction 4+ 4 5 5   Hip internal rotation      Hip external rotation      Knee flexion 4 3+ 5 5  Knee extension 4 4-    Ankle dorsiflexion      Ankle plantarflexion      Ankle inversion      Ankle eversion       (Blank rows = not tested)   FUNCTIONAL TESTS:  01/12/24: 5 times sit to stand: 7.8 seconds no UE 2 minute walk test: 512 feet no AD SLS L:30" 8 R: 30"   Evaluation: 5 times sit to stand: 18.13 2 minute walk test: 407 SLS L: 19.08 R: 30   GAIT: Distance walked: 407 feet Assistive device utilized: None Level of assistance: Complete Independence Comments: decreased gait velocity, decreased stance time on left knee, vaulting compensation noted on RLE                                                                                                                                TREATMENT DATE: 01/11/34 Bike seat 8,  5 minutes level 2 LEFS 56/80 (was 34/80 at evaluation) 5 times sit to stand: 7.8 seconds no UE 2 minute walk test: 512 feet no AD SLS L:30" 8  R: 30"  Self scar massage to lateral knee  Lower Extremity Functional Scale Summary 1. Any of usual work, housework or school activities.No difficulty with usual work, housework or school activities(4 points) 2. Usual hobbies, recreational or sporting activities.No difficulty with  usual hobbies, recreational or sporting activitie (4 points) 3. Getting into or out of the bath.No difficulty getting into or out of the bath(4 points)4. Walking between rooms. No difficulty walking between room (4 points) 5. Putting on your shoes or socks.No difficulty putting on shoes or socks(4 points) 6. Squatting.A little bit of difficulty squatting(3 points) 7. Lifting an object from the floor.No difficulty lifting an object from the floor(4 points) 8. Performing light activities around home.No difficulty performing light activities around home(4 points) 9. Performing heavy activities around home.A little bit of difficulty performing heavy activities around home(3 points) 10. Getting into or out of a car.A little bit of difficulty getting into or out of a car(3 points) 11. Walking 2 blocks.A little bit of difficulty walking 2 blocks(3 points) 12. Walking a mile.Moderate difficulty walking a mile(2 points) 13. Going up or down 10 stairs.A little bit of difficulty going up or down 10 stair (3 points) 14. Standing for 1 hour.A little bit of difficulty standing for 1 hour(3 points) 15. Sitting for 1 hour.No difficulty sitting for 1 hour(4 points) 16. Running on even ground.A lot of difficulty running on even ground(0 points) 17. Running on uneven ground.A lot of difficulty running on uneven ground(0 points) 18. Making sharp turns while running fast.A lot of difficulty making sharp turns while running fas (0 points) 19. Hopping.A lot of difficulty hopping(0 points) 20. Rolling over in bed.No difficulty rolling over in bed(4 points) Lower Extremity Functional Score:56/80=70.0 percent.   01/03/24 Bike seat 9 level 3, 5 minutes Standing:  forward lunges onto BOSU ball no UE 2X10  Forward step downs Lt on step 10X with 1-2 HHA  Lateral step downs Lt on step 10X with 1-2 HHA GTB lateral stepping 20 feet 2RT GTB monster walks 20 feet 2RT  Leg press 2X10 bil LE 4Pl  Leg press 2X10 Lt only 2  PL Long sitting hamstring stretch 4X20" Scar massage to lateral knee/scar  12/28/2023  Therapeutic Exercise: -Stationary bike, 4 minutes, level 3 resistance -Leg press, 3 sets of 10 reps, second sets single LE, bilateral, plate 5 -Banded lateral stepping/monster walks 2 laps 20 feet per lap, second 2 with RTB around ankles, pt cued for upright posture -Forward lunges on to bosu, 1 set of 8 reps better performance going into LLE, pt cued for core activation and upright posture   Therapeutic Activity: -Step up and overs, 1 set of 8 reps, cued for eccentric control -Lateral step up and overs, 1 set of 5 reps, cued for eccentric control -Aeromat walks lateral steps and heel toe stepping, 3 laps in // bars, pt cued for proper foot positioning -Sled pushes, 40 lbs, 60 foot laps, 2 laps, pt reports increased difficulty on LLE going backwards   12/20/23: Review of goals and HEP STS: 10x, no UE support  10x2, w/ YTB, verbal cues to limit inward knee collpase LAQ on LLE only: 10x    10x2, 3 lb. Leg press: 10x plate 4, 9U04 w/ plate 5 Hip ext: YTB, 10x2, bilat. Hip abd: YTB, 10x2, bilat Standing hamstring curl on LLE: YTB, 3x10 Step ups in // bars: 6 inch step, 2x10, leading with LLE, v cues for control  12/17/2023  Evaluation: -ROM measured, Strength assessed, HEP prescribed,  pt educated on prognosis, findings, and importance of HEP compliance if given.   PATIENT EDUCATION:  Education details: Pt was educated on findings of PT evaluation, prognosis, frequency of therapy visits and rationale, attendance policy, and HEP if given.   Person educated: Patient Education method: Explanation, Verbal cues, and Handouts Education comprehension: verbalized understanding and needs further education  HOME EXERCISE PROGRAM: Access Code: [email or web address] URL: [email or web address] Date: 12/17/2023 Prepared by: Armond Bertin  Exercises - Seated Long Arc Quad  - 1 x daily - 7 x weekly - 3 sets  - 10 reps - Sit to Stand with Arms Crossed  - 1 x daily - 7 x weekly - 3 sets - 10 reps - Single Leg Stance  - 1 x daily - 7 x weekly - 3 sets - 10 reps - Seated Hamstring Stretch  - 1 x daily - 7 x weekly - 3 sets - 10 reps  Access Code: [email or web address] URL: [email or web address] Date: 12/20/2023 Prepared by: Virgia Griffins Powell-Butler  Exercises - Forward Step Up  - 2 x daily - 7 x weekly - 3 sets - 10 reps  ASSESSMENT:  CLINICAL IMPRESSION: Pt due for reassessment today.  Test measures completed including functional testing, ROM and strength.  Pt with vast improvements for all measures and now WNL for strength and ROM.  Pt has met all goals at this point with exception of distance achieved for 2 minute walk test coming shy of 35 feet. Pt does continue to have pain and discomfort specifically at lateral knee and reports frustration with this.  Did instruct with self scar massage to this area as may be fascial restrictions.  Pt feels he can continue his exercises independently and aware that his knee stiffness/discomfort will take time to improve.  Pt does complain of hip pain that began after surgery,  urged pt to follow up with orthopedist regarding this or seek new referral.  Pt has no further questions or skilled needs at this time and ready for discharge.   Evaluation: Patient is a 59 y.o. male who was seen today for physical therapy evaluation and treatment for Z98.890 (ICD-10-CM) - S/P left knee arthroscopy.  Patient demonstrates decreased LE strength, abnormal pain rating, and impaired balance. Patient also demonstrates difficulty with ambulation during today's session with decreased stride length on LLE and decreased overall velocity. Patient also demonstrates decreased extesion ROM of L knee compared to the RLE. Patient requires education on POC, therapy strategies, and role of therapy in optimal pt care. Patient would benefit from skilled physical therapy for decreased pain, increased  endurance with ambulation, increased LE strength, and balance for improved gait quality, return to higher level of function with ADLs, and progress towards therapy goals.   OBJECTIVE IMPAIRMENTS: Abnormal gait, decreased balance, decreased endurance, decreased mobility, difficulty walking, decreased ROM, decreased strength, impaired flexibility, and pain.   ACTIVITY LIMITATIONS: carrying, lifting, bending, standing, squatting, stairs, bed mobility, and locomotion level  PARTICIPATION LIMITATIONS: laundry, driving, shopping, community activity, occupation, and yard work  PERSONAL FACTORS: Age, Past/current experiences, and Time since onset of injury/illness/exacerbation are also affecting patient's functional outcome.   REHAB POTENTIAL: Good  CLINICAL DECISION MAKING: Stable/uncomplicated  EVALUATION COMPLEXITY: Low   GOALS: Return to work 29th Goals reviewed with patient? No  SHORT TERM GOALS: Target date: 01/07/24  Patient will demonstrate evidence of independence with individualized HEP and will report compliance for at least 3 days per week for optimized progression towards remaining therapy goals. Baseline:  Goal status: MET  2.  Patient will report a decrease in pain level during community ambulation by at least 2 points for improved quality of life. Baseline: 6/10 Goal status: MET     LONG TERM GOALS: Target date: 01/27/24  Pt will demonstrate a an increase of at least 9 points on the LEFS for improved performance of community ambulation and ADL. Baseline: see objective Goal status: MET  2.  Pt will improve 2 MWT by 140 feet in order to demonstrate improved functional ambulatory capacity in community setting.  Baseline: 407 feet Goal status: NOT MET (35 feet shy of goal)  3.  Pt will demonstrate WFL ROM (flexion and extension) in left knee compared to the right knee, for increased mobility and maximal efficiency of gait cycle during ambulation. Baseline: see  objective Goal status: MET  4.  Pt will demonstrate at least 4-/5 MMT for left lower extremity for increased strength during ADL and community ambulation. Baseline: see objective Goal status: MET  5.  Pt will improve 5TSTS by at least 2 seconds in order to improve functional strenght during functional transfers. Baseline: see obective Goal status: MET    PLAN:  PT FREQUENCY: 2x/week  PT DURATION: 6 weeks  PLANNED INTERVENTIONS: 97110-Therapeutic exercises, 97530- Therapeutic activity, 97112- Neuromuscular re-education, 97535- Self Care, 82956- Manual therapy, [phone]- Gait training, Patient/Family education, Balance training, Stair training, Joint mobilization, Cryotherapy, and Moist heat  PLAN FOR NEXT SESSION: Discharge to HEP; pt now back at work  Lorenso Romance, PTA/CLT [facility] Ph: [phone] 3:19 PM, 01/12/24  Armond Bertin, PT, DPT [facility] Office: [phone] 10:50 AM, 01/15/24

## 2024-01-13 ENCOUNTER — Ambulatory Visit: Admitting: "Endocrinology

## 2024-01-26 ENCOUNTER — Encounter (HOSPITAL_COMMUNITY)

## 2024-01-31 ENCOUNTER — Other Ambulatory Visit: Payer: Self-pay | Admitting: "Endocrinology

## 2024-02-25 ENCOUNTER — Ambulatory Visit

## 2024-02-25 DIAGNOSIS — Z91038 Other insect allergy status: Secondary | ICD-10-CM | POA: Diagnosis not present

## 2024-02-26 ENCOUNTER — Other Ambulatory Visit: Payer: Self-pay | Admitting: "Endocrinology

## 2024-03-28 ENCOUNTER — Other Ambulatory Visit: Payer: Self-pay | Admitting: "Endocrinology

## 2024-04-06 ENCOUNTER — Ambulatory Visit: Admitting: Orthopaedic Surgery

## 2024-04-07 ENCOUNTER — Ambulatory Visit

## 2024-04-12 ENCOUNTER — Ambulatory Visit (INDEPENDENT_AMBULATORY_CARE_PROVIDER_SITE_OTHER)

## 2024-04-12 DIAGNOSIS — Z91038 Other insect allergy status: Secondary | ICD-10-CM | POA: Diagnosis not present

## 2024-04-23 ENCOUNTER — Other Ambulatory Visit: Payer: Self-pay | Admitting: "Endocrinology

## 2024-05-01 ENCOUNTER — Other Ambulatory Visit: Payer: Self-pay | Admitting: "Endocrinology

## 2024-05-01 ENCOUNTER — Telehealth: Payer: Self-pay | Admitting: "Endocrinology

## 2024-05-01 DIAGNOSIS — E782 Mixed hyperlipidemia: Secondary | ICD-10-CM

## 2024-05-01 DIAGNOSIS — E1122 Type 2 diabetes mellitus with diabetic chronic kidney disease: Secondary | ICD-10-CM

## 2024-05-01 NOTE — Telephone Encounter (Signed)
 I left pt a VM to complete fasting labs

## 2024-05-01 NOTE — Telephone Encounter (Signed)
 Pt needs ozempic  called into [facility] in [place]

## 2024-05-01 NOTE — Telephone Encounter (Signed)
 Pt has reschedule for Sept, does he need to do any labs and if so can you put order in

## 2024-05-02 MED ORDER — OZEMPIC (2 MG/DOSE) 8 MG/3ML ~~LOC~~ SOPN: 2.0000 mg | PEN_INJECTOR | SUBCUTANEOUS | 0 refills | Status: DC

## 2024-05-02 NOTE — Telephone Encounter (Signed)
Rx refill sent to [facility]. 

## 2024-05-24 ENCOUNTER — Other Ambulatory Visit: Payer: Self-pay

## 2024-05-24 ENCOUNTER — Ambulatory Visit

## 2024-05-24 ENCOUNTER — Encounter: Payer: Self-pay | Admitting: Allergy & Immunology

## 2024-05-24 ENCOUNTER — Ambulatory Visit: Admitting: Allergy & Immunology

## 2024-05-24 VITALS — BP 118/70 | HR 71 | Temp 98.2°F | Resp 18 | Ht 67.0 in | Wt 210.8 lb

## 2024-05-24 DIAGNOSIS — Z91038 Other insect allergy status: Secondary | ICD-10-CM

## 2024-05-24 DIAGNOSIS — T783XXD Angioneurotic edema, subsequent encounter: Secondary | ICD-10-CM | POA: Diagnosis not present

## 2024-05-24 LAB — COMPREHENSIVE METABOLIC PANEL WITH GFR
ALT: 56 IU/L — ABNORMAL HIGH (ref 0–44)
AST: 33 IU/L (ref 0–40)
Albumin: 4.5 g/dL (ref 3.8–4.9)
Alkaline Phosphatase: 61 IU/L (ref 47–123)
BUN/Creatinine Ratio: 11 (ref 9–20)
BUN: 15 mg/dL (ref 6–24)
Bilirubin Total: 0.7 mg/dL (ref 0.0–1.2)
CO2: 27 mmol/L (ref 20–29)
Calcium: 10 mg/dL (ref 8.7–10.2)
Chloride: 97 mmol/L (ref 96–106)
Creatinine, Ser: 1.4 mg/dL — ABNORMAL HIGH (ref 0.76–1.27)
Globulin, Total: 2.9 g/dL (ref 1.5–4.5)
Glucose: 133 mg/dL — ABNORMAL HIGH (ref 70–99)
Potassium: 4.4 mmol/L (ref 3.5–5.2)
Sodium: 138 mmol/L (ref 134–144)
Total Protein: 7.4 g/dL (ref 6.0–8.5)
eGFR: 58 mL/min/1.73 — ABNORMAL LOW (ref >59)

## 2024-05-24 LAB — TSH: TSH: 1.71 u[IU]/mL (ref 0.450–4.500)

## 2024-05-24 LAB — LIPID PANEL
Chol/HDL Ratio: 3.8 ratio (ref 0.0–5.0)
Cholesterol, Total: 113 mg/dL (ref 100–199)
HDL: 30 mg/dL — ABNORMAL LOW (ref >39)
LDL Chol Calc (NIH): 66 mg/dL (ref 0–99)
Triglycerides: 84 mg/dL (ref 0–149)
VLDL Cholesterol Cal: 17 mg/dL (ref 5–40)

## 2024-05-24 LAB — FIB-4 W/REFLEX TO ELF
FIB-4 Index: 1.21 (ref 0.00–2.67)
Platelets: 211 x10E3/uL (ref 150–450)

## 2024-05-24 LAB — VITAMIN D 25 HYDROXY (VIT D DEFICIENCY, FRACTURES): Vit D, 25-Hydroxy: 29.4 ng/mL — ABNORMAL LOW (ref 30.0–100.0)

## 2024-05-24 LAB — VITAMIN B12: Vitamin B-12: 636 pg/mL (ref 232–1245)

## 2024-05-24 LAB — T4, FREE: Free T4: 1.24 ng/dL (ref 0.82–1.77)

## 2024-05-24 MED ORDER — EPINEPHRINE 0.3 MG/0.3ML IJ SOAJ: 0.3000 mg | INTRAMUSCULAR | 2 refills | Status: AC | PRN

## 2024-05-24 NOTE — Patient Instructions (Addendum)
 1. Insect sting allergy (hornets, wasp, yellow jackets) - Continue with venom immunotherapy.  - EpiPen  is up to date. - Your venom shots protect against 2-3 stings when you reached the top dose.   2. Return in about 1 year (around 05/24/2025). You can have the follow up appointment with Dr. Iva or a Nurse Practicioner (our Nurse Practitioners are excellent and always have Physician oversight!).    Please inform us  of any Emergency Department visits, hospitalizations, or changes in symptoms. Call us  before going to the ED for breathing or allergy symptoms since we might be able to fit you in for a sick visit. Feel free to contact us  anytime with any questions, problems, or concerns.  It was a pleasure to see you again today!  Websites that have reliable patient information: 1. American Academy of Asthma, Allergy, and Immunology: www.aaaai.org 2. Food Allergy Research and Education (FARE): foodallergy.org 3. Mothers of Asthmatics: http://www.asthmacommunitynetwork.org 4. American College of Allergy, Asthma, and Immunology: www.acaai.org      "Like" us  on Facebook and Instagram for our latest updates!      A healthy democracy works best when [employer] participate! Make sure you are registered to vote! If you have moved or changed any of your contact information, you will need to get this updated before voting! Scan the QR codes below to learn more!

## 2024-05-24 NOTE — Progress Notes (Signed)
 FOLLOW UP  Date of Service/Encounter:  05/24/24   Assessment:   Hymenoptera allergy - retesting levels just to reassure patient (although it probably will not change our management at all)  New onset angioedema - getting HAE workup  Plan/Recommendations:   1. Insect sting allergy (hornets, wasp, yellow jackets) - Continue with venom immunotherapy.  - EpiPen  is up to date. - Your venom shots protect against 2-3 stings when you reached the top dose.   2. Return in about 1 year (around 05/24/2025). You can have the follow up appointment with Dr. Iva or a Nurse Practicioner (our Nurse Practitioners are excellent and always have Physician oversight!).   Subjective:   Douglas Robertson. is a 58 y.o. male presenting today for follow up of  Chief Complaint  Patient presents with   Follow-up    Patient presents to the office to see if he is still allergic to what he previously was.    Douglas Robertson. has a history of the following: Patient Active Problem List   Diagnosis Date Noted   Complex tear of lateral meniscus of left knee as current injury 10/01/2023   Chondromalacia of knee, left 10/01/2023   Hypervitaminosis 08/30/2023   Class 1 obesity due to excess calories with serious comorbidity and body mass index (BMI) of 33.0 to 33.9 in adult 04/29/2023   Long-term (current) use of injectable non-insulin antidiabetic drugs 04/29/2023   Tear of left rotator cuff 07/04/2018   Internal impingement of left shoulder 01/18/2018   History of colonic polyps 05/18/2016   Acute bronchitis 01/28/2016   Neoplasm of left kidney 10/10/2015   Gross hematuria 09/18/2015   Biceps tendinitis of both shoulders 10/23/2014   Right anterior knee pain 10/23/2014   Contact dermatitis and other eczema 02/07/2014   Type 2 diabetes mellitus with stage 3a chronic kidney disease, without long-term current use of insulin (HCC) 09/18/2013   Disorder of bursae and tendons in shoulder region  04/12/2013   Left shoulder pain 04/12/2013   Essential hypertension, benign 04/06/2013   Mixed hyperlipidemia 04/06/2013   Routine general medical examination at a health care facility 04/06/2013   Precordial pain 10/01/2011    History obtained from: chart review and patient.  Discussed the use of AI scribe software for clinical note transcription with the patient and/or guardian, who gave verbal consent to proceed.  Douglas Robertson is a 58 y.o. male presenting for a follow up visit.  He was last seen in September 2023.  At that time, we continue with venom immunotherapy.  EpiPen  is up-to-date.  His injections are now spaced to every 6 weeks.   Since the last visit, he has done well.   He is currently receiving venom immunotherapy shots every six weeks. No stings have occurred since the last visit, and he continues to engage in normal activities, although he wears a bee suit when mowing or weed eating to prevent stings. Previous stings have often been more than two or three at a time, which is why he is cautious.  He mentions a new symptom of intermittent numbness in his left lip over the past couple of weeks. It feels like he 'had got stung or something,' but there is no associated swelling or itching. No recent dietary changes or similar symptoms in family members. He is not on any ACE inhibitors. No sinus infections, ear infections, or progression of lip numbness into his throat.  There is no family history of swelling.  He is currently  on a stable dose of testosterone  and has been on Ozempic  for a couple of years. He recently had blood work done by his endocrinologist.  He is semi-retired, having retired from one job and started another. He recently returned from a trip to the [place]. He works nights at the new job at [employer] in [place]. He works in a factory setting.     His wife works in Scientist, product/process development. She mostly works at [employer] and [employer]).   Otherwise, there  have been no changes to his past medical history, surgical history, family history, or social history.    Review of systems otherwise negative other than that mentioned in the HPI.    Objective:   Blood pressure 118/70, pulse 71, temperature 98.2 F (36.8 C), temperature source Temporal, resp. rate 18, height 5' 7 (1.702 m), weight 210 lb 12.8 oz (95.6 kg), SpO2 94%. Body mass index is 33.02 kg/m.    Physical Exam Vitals reviewed.  Constitutional:      Appearance: He is well-developed.     Comments: Friendly.   HENT:     Head: Normocephalic and atraumatic.     Right Ear: Tympanic membrane, ear canal and external ear normal.     Left Ear: Tympanic membrane, ear canal and external ear normal.     Nose: No nasal deformity, septal deviation, mucosal edema or rhinorrhea.     Right Turbinates: Enlarged, swollen and pale.     Left Turbinates: Enlarged, swollen and pale.     Right Sinus: No maxillary sinus tenderness or frontal sinus tenderness.     Left Sinus: No maxillary sinus tenderness or frontal sinus tenderness.     Mouth/Throat:     Mouth: Mucous membranes are not pale and not dry.     Pharynx: Uvula midline.  Eyes:     General:        Right eye: No discharge.        Left eye: No discharge.     Conjunctiva/sclera: Conjunctivae normal.     Right eye: Right conjunctiva is not injected. No chemosis.    Left eye: Left conjunctiva is not injected. No chemosis.    Pupils: Pupils are equal, round, and reactive to light.  Cardiovascular:     Rate and Rhythm: Normal rate and regular rhythm.     Heart sounds: Normal heart sounds.  Pulmonary:     Effort: Pulmonary effort is normal. No tachypnea, accessory muscle usage or respiratory distress.     Breath sounds: Normal breath sounds. No wheezing, rhonchi or rales.     Comments: Moving air well in all lung fields. No increased work of breathing noted.  Chest:     Chest wall: No tenderness.  Lymphadenopathy:     Cervical: No  cervical adenopathy.  Skin:    Coloration: Skin is not pale.     Findings: No abrasion, erythema, petechiae or rash. Rash is not papular, urticarial or vesicular.  Neurological:     Mental Status: He is alert.  Psychiatric:        Behavior: Behavior is cooperative.      Diagnostic studies: labs sent instead       Marty Shaggy, MD  [facility] 

## 2024-05-29 ENCOUNTER — Ambulatory Visit: Admitting: "Endocrinology

## 2024-05-29 LAB — HYMENOPTERA VENOM ALLERGY II

## 2024-05-31 ENCOUNTER — Ambulatory Visit: Payer: Self-pay | Admitting: Allergy & Immunology

## 2024-05-31 ENCOUNTER — Ambulatory Visit: Admitting: "Endocrinology

## 2024-06-01 ENCOUNTER — Encounter: Payer: Self-pay | Admitting: "Endocrinology

## 2024-06-01 ENCOUNTER — Ambulatory Visit: Admitting: "Endocrinology

## 2024-06-01 VITALS — BP 108/66 | HR 68 | Ht 67.0 in | Wt 209.4 lb

## 2024-06-01 DIAGNOSIS — N1831 Chronic kidney disease, stage 3a: Secondary | ICD-10-CM

## 2024-06-01 DIAGNOSIS — I10 Essential (primary) hypertension: Secondary | ICD-10-CM | POA: Diagnosis not present

## 2024-06-01 DIAGNOSIS — E782 Mixed hyperlipidemia: Secondary | ICD-10-CM | POA: Diagnosis not present

## 2024-06-01 DIAGNOSIS — E1122 Type 2 diabetes mellitus with diabetic chronic kidney disease: Secondary | ICD-10-CM

## 2024-06-01 DIAGNOSIS — E678 Other specified hyperalimentation: Secondary | ICD-10-CM

## 2024-06-01 DIAGNOSIS — Z7985 Long-term (current) use of injectable non-insulin antidiabetic drugs: Secondary | ICD-10-CM

## 2024-06-01 LAB — POCT GLYCOSYLATED HEMOGLOBIN (HGB A1C): HbA1c, POC (controlled diabetic range): 7.1 % — AB (ref 0.0–7.0)

## 2024-06-01 MED ORDER — OZEMPIC (2 MG/DOSE) 8 MG/3ML ~~LOC~~ SOPN: 2.0000 mg | PEN_INJECTOR | SUBCUTANEOUS | 1 refills | Status: AC

## 2024-06-01 MED ORDER — METFORMIN HCL ER 500 MG PO TB24: 500.0000 mg | ORAL_TABLET | Freq: Every day | ORAL | 1 refills | Status: AC

## 2024-06-01 NOTE — Progress Notes (Unsigned)
 06/01/2024, 4:46 PM  Endocrinology follow-up note   Subjective:    Patient ID: Douglas JONELLE Claudene Mickey., male    DOB: [birth year].  Douglas JONELLE Claudene Mickey. is being seen in follow-up after he was seen in  consultation for management of currently uncontrolled symptomatic diabetes requested by  Toribio Jerel MATSU, MD.   Past Medical History:  Diagnosis Date  . Cancer (HCC)    kidney (L)  . Chronic kidney disease 2017   left renal mass  . Diabetes mellitus without complication (HCC)   . Eczema   . Hypertension     Past Surgical History:  Procedure Laterality Date  . COLONOSCOPY  2012  . COLONOSCOPY N/A 09/03/2016   Procedure: COLONOSCOPY;  Surgeon: Claudis RAYMOND Rivet, MD;  Location: [facility];  Service: Endoscopy;  Laterality: N/A;  730  . HYDROCELE EXCISION Left 01/01/2022   Procedure: HYDROCELECTOMY ADULT;  Surgeon: Renda Glance, MD;  Location: [facility];  Service: Urology;  Laterality: Left;  ONLY NEEDS 60 MIN  . HYDROCELE EXCISION N/A 02/26/2022   Procedure: HYDROCELE DRAINAGE AND REPAIR, LEFT;  Surgeon: Renda Glance, MD;  Location: [facility];  Service: Urology;  Laterality: N/A;  . KNEE ARTHROSCOPY  1998   right   . KNEE ARTHROSCOPY  2025  . LAPAROSCOPIC NEPHRECTOMY Left 10/10/2015   Procedure: LAPAROSCOPIC RADICAL NEPHRECTOMY;  Surgeon: Glance Renda, MD;  Location: [facility];  Service: Urology;  Laterality: Left;  . SHOULDER SURGERY      Social History   Socioeconomic History  . Marital status: Married    Spouse name: Not on file  . Number of children: Not on file  . Years of education: Not on file  . Highest education level: Not on file  Occupational History  . Not on file  Tobacco Use  . Smoking status: Former  . Smokeless tobacco: Former  Advertising account planner  . Vaping status: Never Used  Substance and Sexual Activity  . Alcohol use: No  . Drug use: No  . Sexual activity: Not on file  Other  Topics Concern  . Not on file  Social History Narrative  . Not on file   Social Drivers of Health   Financial Resource Strain: Not on file  Food Insecurity: Not on file  Transportation Needs: Not on file  Physical Activity: Not on file  Stress: Not on file (07/15/2023)  Social Connections: Not on file    Family History  Problem Relation Age of Onset  . Colon cancer Neg Hx     Outpatient Encounter Medications as of 06/01/2024  Medication Sig  . Cholecalciferol (VITAMIN D ) 50 MCG (2000 UT) CAPS Take 2,000 Units by mouth daily with lunch.  SABRA atorvastatin (LIPITOR) 20 MG tablet Take 20 mg by mouth daily.  . EPINEPHrine  (EPIPEN  2-PAK) 0.3 mg/0.3 mL IJ SOAJ injection Inject 0.3 mg into the muscle as needed for anaphylaxis.  . metFORMIN  (GLUCOPHAGE -XR) 500 MG 24 hr tablet Take 1 tablet (500 mg total) by mouth daily after breakfast.  . ONETOUCH DELICA LANCETS 33G MISC   . ONETOUCH VERIO test strip   .  Semaglutide , 2 MG/DOSE, (OZEMPIC , 2 MG/DOSE,) 8 MG/3ML SOPN Inject 2 mg into the skin once a week.  . testosterone  cypionate (DEPOTESTOSTERONE CYPIONATE) 200 MG/ML injection Inject 100 mg into the muscle every 14 (fourteen) days.  . valsartan-hydrochlorothiazide  (DIOVAN-HCT) 160-12.5 MG tablet Take 1 tablet by mouth daily.  . [DISCONTINUED] metFORMIN  (GLUCOPHAGE -XR) 500 MG 24 hr tablet Take 500 mg by mouth daily after breakfast.  . [DISCONTINUED] Semaglutide , 2 MG/DOSE, (OZEMPIC , 2 MG/DOSE,) 8 MG/3ML SOPN Inject 2 mg into the skin once a week.   Facility-Administered Encounter Medications as of 06/01/2024  Medication  . cetirizine  (ZYRTEC ) tablet 10 mg    ALLERGIES: Allergies  Allergen Reactions  . Bee Venom Anaphylaxis    VACCINATION STATUS: Immunization History  Administered Date(s) Administered  . Influenza-Unspecified 07/08/2018    Diabetes He presents for his follow-up diabetic visit. He has type 2 diabetes mellitus. Onset time: He was diagnosed at approximate age of 45  years. His disease course has been stable. There are no hypoglycemic associated symptoms. Pertinent negatives for hypoglycemia include no headaches, seizures or tremors. There are no diabetic associated symptoms. Pertinent negatives for diabetes include no chest pain and no polydipsia. There are no hypoglycemic complications. Symptoms are stable. Diabetic complications include nephropathy. (He underwent left nephrectomy due to renal CA in 2017.  His remaining kidney shows declining function with CKD stage 3a.) Risk factors for coronary artery disease include family history, dyslipidemia, obesity, male sex and tobacco exposure. Current diabetic treatments: He is currently on Ozempic  1 mg subcutaneously weekly, metformin  500 mg daily. His weight is increasing steadily. He is following a generally unhealthy diet. When asked about meal planning, he reported none. He has not had a previous visit with a dietitian. He participates in exercise intermittently. His home blood glucose trend is decreasing steadily. (He presents with significant glycemic profile with point-of-care A1c of 6.6%.  He does not report any hypoglycemia. ) An ACE inhibitor/angiotensin II receptor blocker is being taken.  Hyperlipidemia This is a chronic problem. The current episode started more than 1 year ago. Exacerbating diseases include chronic renal disease, diabetes, liver disease and obesity. Pertinent negatives include no chest pain, myalgias or shortness of breath. Current antihyperlipidemic treatment includes statins. Risk factors for coronary artery disease include dyslipidemia, diabetes mellitus, obesity, family history and male sex.  Hypertension This is a chronic problem. The current episode started more than 1 year ago. The problem is controlled. Pertinent negatives include no chest pain, headaches, palpitations or shortness of breath. Risk factors for coronary artery disease include dyslipidemia, diabetes mellitus, obesity and male  gender. Past treatments include angiotensin blockers. Hypertensive end-organ damage includes kidney disease. Identifiable causes of hypertension include chronic renal disease.     Review of Systems  Constitutional:  Negative for chills and fever.  Respiratory:  Negative for cough and shortness of breath.   Cardiovascular:  Negative for chest pain and palpitations.       No Shortness of breath  Gastrointestinal:  Negative for abdominal pain, diarrhea, nausea and vomiting.  Endocrine: Negative for polydipsia.  Genitourinary:  Negative for frequency, hematuria and urgency.  Musculoskeletal:  Negative for myalgias.  Skin:  Negative for rash.  Neurological:  Negative for tremors, seizures and headaches.  Hematological:  Does not bruise/bleed easily.  Psychiatric/Behavioral:  Negative for hallucinations and suicidal ideas.     Objective:       06/01/2024    3:06 PM 05/24/2024    9:47 AM 08/30/2023    3:45 PM  Vitals with BMI  Height 5' 7 5' 7   Weight 209 lbs 6 oz 210 lbs 13 oz   BMI 32.79 33.01   Systolic 108 118 863  Diastolic 66 70 78  Pulse 68 71     BP 108/66   Pulse 68   Ht 5' 7 (1.702 m)   Wt 209 lb 6.4 oz (95 kg)   BMI 32.80 kg/m   Wt Readings from Last 3 Encounters:  06/01/24 209 lb 6.4 oz (95 kg)  05/24/24 210 lb 12.8 oz (95.6 kg)  08/30/23 219 lb 12.8 oz (99.7 kg)       CMP ( most recent) CMP     Component Value Date/Time   NA 138 05/23/2024 0815   K 4.4 05/23/2024 0815   CL 97 05/23/2024 0815   CO2 27 05/23/2024 0815   GLUCOSE 133 (H) 05/23/2024 0815   GLUCOSE 105 (H) 02/23/2022 1316   BUN 15 05/23/2024 0815   CREATININE 1.40 (H) 05/23/2024 0815   CALCIUM 10.0 05/23/2024 0815   PROT 7.4 05/23/2024 0815   ALBUMIN 4.5 05/23/2024 0815   AST 33 05/23/2024 0815   ALT 56 (H) 05/23/2024 0815   ALKPHOS 61 05/23/2024 0815   BILITOT 0.7 05/23/2024 0815   EGFR 58 (L) 05/23/2024 0815   GFRNONAA 59 (L) 02/23/2022 1316     Diabetic Labs (most  recent): Lab Results  Component Value Date   HGBA1C 7.1 (A) 06/01/2024   HGBA1C 6.6 08/30/2023   HGBA1C 6.4 (H) 02/23/2022   Lipid Panel     Component Value Date/Time   CHOL 113 05/23/2024 0815   TRIG 84 05/23/2024 0815   HDL 30 (L) 05/23/2024 0815   CHOLHDL 3.8 05/23/2024 0815   LDLCALC 66 05/23/2024 0815   LABVLDL 17 05/23/2024 0815     Assessment & Plan:   1. Type 2 diabetes mellitus with stage 3a chronic kidney disease, without long-term current use of insulin (HCC)  - Douglas JONELLE Claudene Mickey. has currently uncontrolled symptomatic type 2 DM since  58 years of age. He presents with significant glycemic profile with point-of-care A1c of 6.6%.  He does not report any hypoglycemia.   -  Recent labs reviewed. - I had a long discussion with him about the possible risk factors and  the pathology behind its diabetes and its complications. -his has metabolic syndrome complicated by fatty liver disease and diabetes which in turn  is complicated by history of renal CA status post left nephrectomy and CKD, obesity, comorbid hypertension, hyperlipidemia and he remains at a high risk for more acute and chronic complications which include CAD, CVA, CKD, retinopathy, and neuropathy. These are all discussed in detail with him.  - I discussed all available options of managing his diabetes including de-escalation of medications. I have counseled him on Food as Medicine by adopting a Whole Food , Plant Predominant  ( WFPP) nutrition as recommended by [employer] of Lifestyle Medicine. Patient is encouraged to switch to  unprocessed or minimally processed  complex starch, adequate protein intake (mainly plant source), minimal liquid fat, plenty of fruits, and vegetables. -  he is advised to stick to a routine mealtimes to eat 3 complete meals a day and snack only when necessary ( to snack only to correct hypoglycemia BG <70 day time or <100 at night).   - he acknowledges that there is a room for  improvement in his food and drink choices. - Suggestion is made for him to avoid  simple carbohydrates  from his diet including Cakes, Sweet Desserts, Ice Cream, Soda (diet and regular), Sweet Tea, Candies, Chips, Cookies, Store Bought Juices, Alcohol , Artificial Sweeteners,  Coffee Creamer, and Sugar-free Products, Lemonade. This will help patient to have more stable blood glucose profile and potentially avoid unintended weight gain.  The following Lifestyle Medicine recommendations according to American College of Lifestyle Medicine  [facility]) were discussed and and offered to patient and he  agrees to start the journey:  A. Whole Foods, Plant-Based Nutrition comprising of fruits and vegetables, plant-based proteins, whole-grain carbohydrates was discussed in detail with the patient.   A list for source of those nutrients were also provided to the patient.  Patient will use only water  or unsweetened tea for hydration. B.  The need to stay away from risky substances including alcohol, smoking; obtaining 7 to 9 hours of restorative sleep, at least 150 minutes of moderate intensity exercise weekly, the importance of healthy social connections,  and stress management techniques were discussed. C.  A full color page of  Calorie density of various food groups per pound showing examples of each food groups was provided to the patient.   - he has been scheduled with Penny Crumpton, RDN, CDE for individualized diabetes education.  - I have approached him with the following individualized plan to manage  his diabetes and patient agrees:   -In light of his presentation with controlled glycemia, he would not need insulin treatment for now.  She will continue to benefit from GLP-1 receptor agonists.  I discussed and increase his Ozempic  to 2 mg subcutaneously weekly.   -He has no stable and normal renal function.  He is advised to continue metformin  500 mg p.o. once a day.   -On subsequent visit, he will be  switched to low-dose SGLT2 inhibitors instead of metformin .  - Specific targets for  A1c;  LDL, HDL,  and Triglycerides were discussed with the patient.  2) Blood Pressure /Hypertension: His blood pressure is controlled to target.  he is advised to continue his current medications including valsartan/HCTZ 160-12.5 mg p.o. daily with breakfast .  3) Lipids/Hyperlipidemia: His recent lipid panel showed significant improvement including LDL at 44.    He is advised to continue atorvastatin 20 mg p.o. nightly.  Whole food plant-based diet as discussed above will help him address dyslipidemia.   4)  Weight/Diet:  Body mass index is 32.8 kg/m.  -   clearly complicating his diabetes care.   he is  a candidate for weight loss. I discussed with him the fact that loss of 5 - 10% of his  current body weight will have the most impact on his diabetes management.  The above detailed  ACLM recommendations for nutrition, exercise, sleep, social life, avoidance of risky substances, the need for restorative sleep   information will also detailed on discharge instructions.  5) Chronic Care/Health Maintenance:  -he  is on ACEI/ARB and Statin medications and  is encouraged to initiate and continue to follow up with Ophthalmology, Dentist,  Podiatrist at least yearly or according to recommendations, and advised to   stay away from smoking. I have recommended yearly flu vaccine and pneumonia vaccine at least every 5 years; moderate intensity exercise for up to 150 minutes weekly; and  sleep for 7- 9 hours a day.  - he is  advised to maintain close follow up with Toribio Jerel MATSU, MD for primary care needs, as well as his other providers for optimal and  coordinated care.   I spent  26  minutes in the care of the patient today including review of labs from CMP, Lipids, Thyroid  Function, Hematology (current and previous including abstractions from other facilities); face-to-face time discussing  his blood glucose  readings/logs, discussing hypoglycemia and hyperglycemia episodes and symptoms, medications doses, his options of short and long term treatment based on the latest standards of care / guidelines;  discussion about incorporating lifestyle medicine;  and documenting the encounter. Risk reduction counseling performed per USPSTF guidelines to reduce  obesity and cardiovascular risk factors.     Please refer to Patient Instructions for Blood Glucose Monitoring and Insulin/Medications Dosing Guide  in media tab for additional information. Please  also refer to  Patient Self Inventory in the Media  tab for reviewed elements of pertinent patient history.  Douglas JONELLE Claudene Mickey. participated in the discussions, expressed understanding, and voiced agreement with the above plans.  All questions were answered to his satisfaction. he is encouraged to contact clinic should he have any questions or concerns prior to his return visit.    Follow up plan: - Return in about 6 months (around 11/29/2024) for Fasting Labs  in AM B4 8, A1c -NV.  Ranny Earl, MD [facility] [address] [place], [place] [address] Phone: [phone]  Fax: [phone]    06/01/2024, 4:46 PM  This note was partially dictated with voice recognition software. Similar sounding words can be transcribed inadequately or may not  be corrected upon review.

## 2024-06-01 NOTE — Patient Instructions (Signed)

## 2024-06-02 LAB — HYMENOPTERA VENOM ALLERGY II
Bumblebee: <0.1 kU/L
Hornet, White Face, IgE: 0.51 kU/L — AB
Hornet, Yellow, IgE: 0.28 kU/L — AB
I001-IgE Honeybee: <0.1 kU/L
I208-IgE Api m 1: <0.1 kU/L
I209-IgE Ves v 5: 0.88 kU/L — AB
I211-IgE Ves v 1: 0.11 kU/L — AB
I211-IgE Ves v 1: 0.54 kU/L — AB
I214-IgE Api m 2: <0.1 kU/L
I215-IgE Api m 3: <0.1 kU/L
I216-IgE Api m 5: 1.92 kU/L — AB
I217-IgE Api m 10: <0.1 kU/L
Reflex Information: 0.31 kU/L — AB
Reflex Information: 0.58 kU/L — AB
Tryptase: 8.6 ug/L (ref 2.2–13.2)

## 2024-06-02 LAB — C3 AND C4
Complement C3, Serum: 162 mg/dL (ref 82–167)
Complement C4, Serum: 21 mg/dL (ref 12–38)

## 2024-06-02 LAB — C1 ESTERASE INHIBITOR: C1INH SerPl-mCnc: 30 mg/dL (ref 21–39)

## 2024-06-02 LAB — ALLERGEN COMPONENT COMMENTS

## 2024-06-02 LAB — COMPLEMENT COMPONENT C1Q: Complement C1Q: 11 mg/dL (ref 10.2–20.3)

## 2024-06-02 LAB — C1 ESTERASE INHIBITOR, FUNCTIONAL: C1INH Functional/C1INH Total MFr SerPl: 102 %{normal}

## 2024-06-05 ENCOUNTER — Telehealth: Payer: Self-pay

## 2024-06-05 NOTE — Telephone Encounter (Signed)
 Left a message requesting pt return call to the office.

## 2024-07-03 ENCOUNTER — Ambulatory Visit

## 2024-07-03 DIAGNOSIS — Z91038 Other insect allergy status: Secondary | ICD-10-CM | POA: Diagnosis not present

## 2024-07-10 ENCOUNTER — Encounter: Payer: Self-pay | Admitting: Radiology

## 2024-08-14 ENCOUNTER — Ambulatory Visit (INDEPENDENT_AMBULATORY_CARE_PROVIDER_SITE_OTHER)

## 2024-08-14 DIAGNOSIS — Z91038 Other insect allergy status: Secondary | ICD-10-CM

## 2024-09-25 ENCOUNTER — Ambulatory Visit

## 2024-09-25 DIAGNOSIS — Z91038 Other insect allergy status: Secondary | ICD-10-CM

## 2024-11-06 ENCOUNTER — Ambulatory Visit

## 2024-12-05 ENCOUNTER — Ambulatory Visit: Admitting: "Endocrinology

## 2025-05-25 ENCOUNTER — Ambulatory Visit: Admitting: Allergy & Immunology
